# Patient Record
Sex: Female | Born: 1965 | Race: White | Hispanic: No | Smoking: Never smoker
Health system: Southern US, Community
[De-identification: ages and names within clinical notes are randomized; demographics above are authoritative.]

## PROBLEM LIST (undated history)

## (undated) DIAGNOSIS — R569 Unspecified convulsions: Secondary | ICD-10-CM

## (undated) DIAGNOSIS — F32A Depression, unspecified: Secondary | ICD-10-CM

## (undated) DIAGNOSIS — F329 Major depressive disorder, single episode, unspecified: Secondary | ICD-10-CM

## (undated) HISTORY — DX: Unspecified convulsions: R56.9

## (undated) HISTORY — PX: BREAST SURGERY: SHX581

---

## 2015-01-11 DIAGNOSIS — G43109 Migraine with aura, not intractable, without status migrainosus: Secondary | ICD-10-CM | POA: Insufficient documentation

## 2016-02-21 DIAGNOSIS — N6092 Unspecified benign mammary dysplasia of left breast: Secondary | ICD-10-CM | POA: Insufficient documentation

## 2016-09-18 DIAGNOSIS — G43019 Migraine without aura, intractable, without status migrainosus: Secondary | ICD-10-CM | POA: Insufficient documentation

## 2016-11-06 ENCOUNTER — Encounter: Payer: Self-pay | Admitting: Emergency Medicine

## 2016-11-06 ENCOUNTER — Emergency Department
Admission: EM | Admit: 2016-11-06 | Discharge: 2016-11-06 | Disposition: A | Discharge status: Home / Self Care | Payer: No Typology Code available for payment source | Attending: Student in an Organized Health Care Education/Training Program | Admitting: Student in an Organized Health Care Education/Training Program

## 2016-11-06 ENCOUNTER — Emergency Department: Payer: No Typology Code available for payment source

## 2016-11-06 DIAGNOSIS — S161XXA Strain of muscle, fascia and tendon at neck level, initial encounter: Secondary | ICD-10-CM | POA: Diagnosis not present

## 2016-11-06 DIAGNOSIS — Y92410 Unspecified street and highway as the place of occurrence of the external cause: Secondary | ICD-10-CM | POA: Diagnosis not present

## 2016-11-06 DIAGNOSIS — Z91040 Latex allergy status: Secondary | ICD-10-CM | POA: Diagnosis not present

## 2016-11-06 DIAGNOSIS — S20211A Contusion of right front wall of thorax, initial encounter: Secondary | ICD-10-CM | POA: Insufficient documentation

## 2016-11-06 DIAGNOSIS — Y9389 Activity, other specified: Secondary | ICD-10-CM | POA: Insufficient documentation

## 2016-11-06 DIAGNOSIS — Y999 Unspecified external cause status: Secondary | ICD-10-CM | POA: Insufficient documentation

## 2016-11-06 DIAGNOSIS — M7918 Myalgia, other site: Secondary | ICD-10-CM

## 2016-11-06 DIAGNOSIS — S199XXA Unspecified injury of neck, initial encounter: Secondary | ICD-10-CM | POA: Diagnosis present

## 2016-11-06 HISTORY — DX: Depression, unspecified: F32.A

## 2016-11-06 HISTORY — DX: Major depressive disorder, single episode, unspecified: F32.9
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
## 2016-11-06 MED ORDER — TRAMADOL HCL 50 MG PO TABS: 50.0000 mg | ORAL_TABLET | Freq: Four times a day (QID) | ORAL | 0 refills | Status: AC | PRN

## 2016-11-06 MED ORDER — TRAMADOL HCL 50 MG PO TABS
50.0000 mg | ORAL_TABLET | Freq: Once | ORAL | Status: AC
  Administered 2016-11-06: 50 mg via ORAL
  Filled 2016-11-06: qty 1

## 2016-11-06 MED ORDER — IBUPROFEN 600 MG PO TABS: 600.0000 mg | ORAL_TABLET | Freq: Three times a day (TID) | ORAL | 0 refills | Status: DC | PRN

## 2016-11-06 MED ORDER — METHOCARBAMOL 750 MG PO TABS: 750.0000 mg | ORAL_TABLET | Freq: Four times a day (QID) | ORAL | 0 refills | Status: DC

## 2016-11-06 MED ORDER — CYCLOBENZAPRINE HCL 10 MG PO TABS
10.0000 mg | ORAL_TABLET | Freq: Once | ORAL | Status: AC
  Administered 2016-11-06: 10 mg via ORAL
  Filled 2016-11-06: qty 1
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
## 2016-11-17 DIAGNOSIS — G709 Myoneural disorder, unspecified: Secondary | ICD-10-CM | POA: Insufficient documentation

## 2017-09-01 ENCOUNTER — Emergency Department
Admission: EM | Admit: 2017-09-01 | Discharge: 2017-09-01 | Disposition: A | Discharge status: Home / Self Care | Payer: BLUE CROSS/BLUE SHIELD | Attending: Emergency Medicine | Admitting: Emergency Medicine

## 2017-09-01 ENCOUNTER — Encounter: Payer: Self-pay | Admitting: Emergency Medicine

## 2017-09-01 DIAGNOSIS — Z79899 Other long term (current) drug therapy: Secondary | ICD-10-CM | POA: Insufficient documentation

## 2017-09-01 DIAGNOSIS — Z91010 Allergy to peanuts: Secondary | ICD-10-CM | POA: Diagnosis not present

## 2017-09-01 DIAGNOSIS — N6320 Unspecified lump in the left breast, unspecified quadrant: Secondary | ICD-10-CM | POA: Insufficient documentation

## 2017-09-01 DIAGNOSIS — Z91040 Latex allergy status: Secondary | ICD-10-CM | POA: Insufficient documentation

## 2017-09-01 DIAGNOSIS — N6310 Unspecified lump in the right breast, unspecified quadrant: Secondary | ICD-10-CM

## 2017-09-01 LAB — CBC WITH DIFFERENTIAL/PLATELET
Basophils Absolute: 0 10*3/uL (ref 0–0.1)
Basophils Relative: 0 %
Eosinophils Absolute: 0 10*3/uL (ref 0–0.7)
Eosinophils Relative: 0 %
HEMATOCRIT: 32.6 % — AB (ref 35.0–47.0)
HEMOGLOBIN: 10.9 g/dL — AB (ref 12.0–16.0)
LYMPHS ABS: 1.9 10*3/uL (ref 1.0–3.6)
LYMPHS PCT: 19 %
MCH: 27.7 pg (ref 26.0–34.0)
MCHC: 33.5 g/dL (ref 32.0–36.0)
MCV: 82.7 fL (ref 80.0–100.0)
Monocytes Absolute: 0.7 10*3/uL (ref 0.2–0.9)
Monocytes Relative: 7 %
NEUTROS ABS: 7.2 10*3/uL — AB (ref 1.4–6.5)
Neutrophils Relative %: 74 %
Platelets: 347 10*3/uL (ref 150–440)
RBC: 3.94 MIL/uL (ref 3.80–5.20)
RDW: 13.5 % (ref 11.5–14.5)
WBC: 9.9 10*3/uL (ref 3.6–11.0)

## 2017-09-01 LAB — BASIC METABOLIC PANEL
ANION GAP: 8 (ref 5–15)
BUN: 8 mg/dL (ref 6–20)
CHLORIDE: 108 mmol/L (ref 101–111)
CO2: 21 mmol/L — ABNORMAL LOW (ref 22–32)
Calcium: 8.6 mg/dL — ABNORMAL LOW (ref 8.9–10.3)
Creatinine, Ser: 0.75 mg/dL (ref 0.44–1.00)
GFR calc Af Amer: >60 mL/min (ref >60)
GLUCOSE: 106 mg/dL — AB (ref 65–99)
POTASSIUM: 3.9 mmol/L (ref 3.5–5.1)
SODIUM: 137 mmol/L (ref 135–145)

## 2017-09-01 MED ORDER — MORPHINE SULFATE (PF) 2 MG/ML IV SOLN
INTRAVENOUS | Status: AC
  Filled 2017-09-01: qty 1

## 2017-09-01 MED ORDER — SODIUM CHLORIDE 0.9 % IV BOLUS (SEPSIS)
1000.0000 mL | Freq: Once | INTRAVENOUS | Status: AC
  Administered 2017-09-01: 1000 mL via INTRAVENOUS

## 2017-09-01 MED ORDER — ONDANSETRON HCL 4 MG/2ML IJ SOLN
4.0000 mg | Freq: Once | INTRAMUSCULAR | Status: AC
  Administered 2017-09-01: 4 mg via INTRAVENOUS
  Filled 2017-09-01: qty 2

## 2017-09-01 MED ORDER — MORPHINE SULFATE (PF) 2 MG/ML IV SOLN
2.0000 mg | Freq: Once | INTRAVENOUS | Status: AC
  Administered 2017-09-01: 2 mg via INTRAVENOUS

## 2017-09-01 MED ORDER — DOXYCYCLINE HYCLATE 100 MG PO CAPS
100.0000 mg | ORAL_CAPSULE | Freq: Two times a day (BID) | ORAL | 0 refills | Status: DC
Start: 2017-09-01 — End: 2020-09-13

## 2017-09-01 MED ORDER — ONDANSETRON HCL 4 MG/2ML IJ SOLN
2.0000 mg | Freq: Once | INTRAMUSCULAR | Status: AC
  Administered 2017-09-01: 2 mg via INTRAVENOUS
  Filled 2017-09-01: qty 2

## 2017-09-01 MED ORDER — HYDROCODONE-ACETAMINOPHEN 5-325 MG PO TABS: 1.0000 | ORAL_TABLET | ORAL | 0 refills | Status: DC | PRN

## 2017-09-01 MED ORDER — MORPHINE SULFATE (PF) 4 MG/ML IV SOLN
4.0000 mg | Freq: Once | INTRAVENOUS | Status: AC
  Administered 2017-09-01: 4 mg via INTRAVENOUS
  Filled 2017-09-01: qty 1

## 2017-09-01 MED ORDER — DOXYCYCLINE HYCLATE 100 MG PO TABS
100.0000 mg | ORAL_TABLET | Freq: Two times a day (BID) | ORAL | Status: DC
  Administered 2017-09-01: 100 mg via ORAL
  Filled 2017-09-01: qty 1

## 2017-09-01 NOTE — ED Provider Notes (Signed)
[facility] Emergency Department Provider Note ____________________________________________   I have reviewed the triage vital signs and the triage nursing note.  HISTORY  Chief Complaint Abscess   Historian Patient  HPI Chelsea Stanley is a 51 y.o. female presents for evaluation of painful breast mass with drainage.  She has had a complicated history with multiple breast biopsies over this past two years.  Today states right breast lump opened and is draining clear liquid x a few days.  Pain has increased to the left lateral breast where there is a tender breast mass without redness or drainage.  Review of prior chart history:  She had a leftsuperior lateral breast lumpectomy in 02/2016. Gets treatment with breast surgeon Dr. Marcene Duos at [facility]. In 04/2016 had I&D left breast abscess in OR with wound vac. 03/2017 complaint of 2 right breast lumps and 2 left breast lumps with nipple discharge 05/2017 results reported to patient of negative pathology of right sterotactic biopsy.  Was complaining of painful breast lumps, no nipple discharge.   Patient states she called her doctor's office a day or 2 ago and was told to come to the ER for drainage.  Pain is severe.     Past Medical History:  Diagnosis Date  . Depression   Psychiatric hospital admission in past year  There are no active problems to display for this patient.   Past Surgical History:  Procedure Laterality Date  . BREAST SURGERY      Prior to Admission medications   Medication Sig Start Date End Date Taking? Authorizing Provider  hydrOXYzine (ATARAX/VISTARIL) 50 MG tablet Take 50 mg by mouth daily as needed. 09/22/16  Yes [provider]  mirtazapine (REMERON) 15 MG tablet Take 15 mg by mouth at bedtime.   Yes [provider]  promethazine (PHENERGAN) 12.5 MG tablet Take 12.5 mg by mouth every 6 (six) hours as needed for nausea or vomiting.   Yes [provider]   traZODone (DESYREL) 100 MG tablet Take 100 mg by mouth daily. 09/22/16  Yes [provider]  Butalbital-APAP-Caffeine 50-325-40 MG capsule Take 1 capsule by mouth every 4 (four) hours as needed.    [provider]  clonazePAM (KLONOPIN) 0.5 MG tablet Take 3 tablets by mouth at bedtime.    [provider]  ibuprofen (ADVIL,MOTRIN) 600 MG tablet Take 1 tablet (600 mg total) by mouth every 8 (eight) hours as needed. 11/06/16   Joni Reining, PA-C  methocarbamol (ROBAXIN-750) 750 MG tablet Take 1 tablet (750 mg total) by mouth 4 (four) times daily. Patient not taking: Reported on 09/01/2017 11/06/16   Joni Reining, PA-C  sertraline (ZOLOFT) 50 MG tablet Take 50 mg by mouth at bedtime.    [provider]  traMADol (ULTRAM) 50 MG tablet Take 1 tablet (50 mg total) by mouth every 6 (six) hours as needed. 11/06/16 11/06/17  Joni Reining, PA-C    Allergies  Allergen Reactions  . Penicillins Anaphylaxis    Has patient had a PCN reaction causing immediate rash, facial/tongue/throat swelling, SOB or lightheadedness with hypotension: Yes Has patient had a PCN reaction causing severe rash involving mucus membranes or skin necrosis: Yes Has patient had a PCN reaction that required hospitalization: Yes Has patient had a PCN reaction occurring within the last 10 years: No If all of the above answers are "NO", then may proceed with Cephalosporin use.   Rolland Porter (Diagnostic) Anaphylaxis  . Peanut-Containing Drug Products Swelling  . Latex Rash  .  Sulfa Antibiotics Rash    No family history on file.  Social History Social History  Substance Use Topics  . Smoking status: Never Smoker  . Smokeless tobacco: Never Used  . Alcohol use No    Review of Systems  Constitutional: Negative for fever. Eyes: Negative for visual changes. ENT: Negative for sore throat. Cardiovascular: Negative for chest pain. Respiratory: Negative for shortness of  breath. Gastrointestinal: Negative for abdominal pain, vomiting and diarrhea. Genitourinary: Negative for dysuria. Musculoskeletal: Negative for back pain. Skin: Negative for rash. Neurological: Negative for headache.  ____________________________________________   PHYSICAL EXAM:  VITAL SIGNS: ED Triage Vitals [09/01/17 1036]  Enc Vitals Group     BP (!) 109/56     Pulse Rate 77     Resp 18     Temp 98.1 F (36.7 C)     Temp Source Oral     SpO2 97 %     Weight 100 lb (45.4 kg)     Height  (1.651 m)     Head Circumference      Peak Flow      Pain Score      Pain Loc      Pain Edu?      Excl. in GC?      Constitutional: Alert and oriented. Well appearing and in no distress. HEENT   Head: Normocephalic and atraumatic.      Eyes: Conjunctivae are normal. Pupils equal and round.       Ears:         Nose: No congestion/rhinnorhea.   Mouth/Throat: Mucous membranes are moist.   Neck: No stridor. Cardiovascular/Chest: Normal rate, regular rhythm.  No murmurs, rubs, or gallops. Breast:  Right lateral breast with tender area laterally and drainage site, serosanguinous appearance.  No nipple discharge.  Left breast with wound incision inferior breast, mildly tender.  exquisite tenderness to the lateral aspect of the left breast which is swollen and firm without redness overlying skin erythema. Respiratory: Normal respiratory effort without tachypnea nor retractions. Breath sounds are clear and equal bilaterally. No wheezes/rales/rhonchi. Gastrointestinal: Soft. No distention, no guarding, no rebound. Nontender.    Genitourinary/rectal:Deferred Musculoskeletal: Nontender with normal range of motion in all extremities. No joint effusions.  No lower extremity tenderness.  No edema. Neurologic:  Normal speech and language. No gross or focal neurologic deficits are appreciated. Skin:  Skin is warm, dry and intact. No rash noted. Psychiatric: Mood and affect are normal.  Speech and behavior are normal. Patient exhibits appropriate insight and judgment.   ____________________________________________  LABS (pertinent positives/negatives) I, Governor Rooks, MD the attending physician have reviewed the labs noted below.  Labs Reviewed  BASIC METABOLIC PANEL  CBC WITH DIFFERENTIAL/PLATELET    ____________________________________________    EKG I, Governor Rooks, MD, the attending physician have personally viewed and interpreted all ECGs.  None ____________________________________________  RADIOLOGY All Xrays were viewed by me.  Imaging interpreted by Radiologist, and I, Governor Rooks, MD the attending physician have reviewed the radiologist interpretation noted below.  None __________________________________________  PROCEDURES  Procedure(s) performed: None  Critical Care performed: None  ____________________________________________  No current facility-administered medications on file prior to encounter.    Current Outpatient Prescriptions on File Prior to Encounter  Medication Sig Dispense Refill  . ibuprofen (ADVIL,MOTRIN) 600 MG tablet Take 1 tablet (600 mg total) by mouth every 8 (eight) hours as needed. 15 tablet 0  . methocarbamol (ROBAXIN-750) 750 MG tablet Take 1 tablet (750 mg total) by mouth  4 (four) times daily. (Patient not taking: Reported on 09/01/2017) 20 tablet 0  . traMADol (ULTRAM) 50 MG tablet Take 1 tablet (50 mg total) by mouth every 6 (six) hours as needed. 20 tablet 0    ____________________________________________  ED COURSE / ASSESSMENT AND PLAN  Pertinent labs & imaging results that were available during my care of the patient were reviewed by me and considered in my medical decision making (see chart for details).   patient is nearly tearful in pain. The right side of the lateral breast is draining and does not appear frankly purulent, more serosanguineous, there is no redness there, but there does appear to be  some mild lump the appearance which is especially tender. The left breast is also lumpy to visualization and exquisitely tender laterally without redness or overlying erythema or pointing.   laboratory studies are overall reassuring. No elevated white blood count, there is a slight left shift.  Patient is symptomatically much improved after dose of morphine.  I spoke with the patient's own breast surgeon's nurse who is going to make sure the patient has an office visit within 2 days, on Thursday. I am going to go ahead and cover the patient with doxycycline for possible infectious etiology, although there is no redness or warmth or fever, and I'm not her percent convinced that this is actually infectious in etiology. In any case I'm going to give her pain control, she has tolerated hydrocodone before. Was unable to access the [place] drug database due to login issues.  DIFFERENTIAL DIAGNOSIS: including but not limited to abscess, cancer, neuropathic or chronic pain/nerve damage from multiple prior biopsies and surgeries, etc.  CONSULTATIONS:  General/breast surgery Dr. Brain Hilts nurse, [facility], patient will be called for appointment in the office on Thursday.   Patient / Family / Caregiver informed of clinical course, medical decision-making process, and agree with plan.   I discussed return precautions, follow-up instructions, and discharge instructions with patient and/or family.  Discharge Instructions : Dineen Kid being treated with antibiotic doxycycline for possibility of infection as well as pain medication for symptomatic relief until you can be seen in the office by your breast surgeon.  You should be called by the office, but if you have not heard tomorrow, please call.  Return to the emergency room immediately for any worsening or uncontrolled pain, skin redness or rash, fever, or any other symptoms concerning to you.  ___________________________________________   FINAL CLINICAL  IMPRESSION(S) / ED DIAGNOSES   Final diagnoses:  Breast mass, left  Breast mass, right              Note: This dictation was prepared with Dragon dictation. Any transcriptional errors that result from this process are unintentional    Governor Rooks, MD 09/01/17 1441

## 2017-09-01 NOTE — Discharge Instructions (Signed)
you're being treated with antibiotic doxycycline for possibility of infection as well as pain medication for symptomatic relief until you can be seen in the office by your breast surgeon.  You should be called by the office, but if you have not heard tomorrow, please call.  Return to the emergency room immediately for any worsening or uncontrolled pain, skin redness or rash, fever, or any other symptoms concerning to you.

## 2017-09-01 NOTE — ED Notes (Signed)
ED Provider at bedside. 

## 2017-09-01 NOTE — ED Triage Notes (Addendum)
Pt with abscess to right breast with drainage and hard abcess to left breast. Pt with biopsies to both breast about three mths ago.

## 2018-01-07 DIAGNOSIS — F445 Conversion disorder with seizures or convulsions: Secondary | ICD-10-CM

## 2018-01-07 DIAGNOSIS — G40909 Epilepsy, unspecified, not intractable, without status epilepticus: Secondary | ICD-10-CM

## 2018-01-11 DIAGNOSIS — I5181 Takotsubo syndrome: Secondary | ICD-10-CM | POA: Insufficient documentation

## 2018-06-18 ENCOUNTER — Ambulatory Visit
Admission: RE | Admit: 2018-06-18 | Discharge: 2018-06-18 | Disposition: A | Discharge status: Home / Self Care | Payer: Disability Insurance | Source: Ambulatory Visit | Attending: Thoracic Surgery | Admitting: Thoracic Surgery

## 2018-06-18 ENCOUNTER — Other Ambulatory Visit: Payer: Self-pay | Admitting: Thoracic Surgery

## 2018-06-18 DIAGNOSIS — M542 Cervicalgia: Secondary | ICD-10-CM

## 2018-06-18 DIAGNOSIS — G8929 Other chronic pain: Secondary | ICD-10-CM | POA: Diagnosis present

## 2018-06-18 DIAGNOSIS — I509 Heart failure, unspecified: Secondary | ICD-10-CM

## 2018-07-16 ENCOUNTER — Other Ambulatory Visit: Payer: Self-pay

## 2018-07-16 ENCOUNTER — Emergency Department: Payer: BLUE CROSS/BLUE SHIELD

## 2018-07-16 ENCOUNTER — Encounter: Payer: Self-pay | Admitting: Emergency Medicine

## 2018-07-16 ENCOUNTER — Emergency Department
Admission: EM | Admit: 2018-07-16 | Discharge: 2018-07-16 | Disposition: A | Discharge status: Home / Self Care | Payer: BLUE CROSS/BLUE SHIELD | Attending: Emergency Medicine | Admitting: Emergency Medicine

## 2018-07-16 DIAGNOSIS — R569 Unspecified convulsions: Secondary | ICD-10-CM | POA: Insufficient documentation

## 2018-07-16 DIAGNOSIS — Z91040 Latex allergy status: Secondary | ICD-10-CM | POA: Insufficient documentation

## 2018-07-16 DIAGNOSIS — Z91010 Allergy to peanuts: Secondary | ICD-10-CM | POA: Insufficient documentation

## 2018-07-16 LAB — CBC WITH DIFFERENTIAL/PLATELET
Basophils Absolute: 0 10*3/uL (ref 0–0.1)
Basophils Relative: 1 %
EOS ABS: 0 10*3/uL (ref 0–0.7)
Eosinophils Relative: 1 %
HCT: 35.2 % (ref 35.0–47.0)
HEMOGLOBIN: 11.8 g/dL — AB (ref 12.0–16.0)
LYMPHS ABS: 1.7 10*3/uL (ref 1.0–3.6)
LYMPHS PCT: 30 %
MCH: 27.6 pg (ref 26.0–34.0)
MCHC: 33.6 g/dL (ref 32.0–36.0)
MCV: 82.1 fL (ref 80.0–100.0)
MONOS PCT: 9 %
Monocytes Absolute: 0.5 10*3/uL (ref 0.2–0.9)
NEUTROS PCT: 59 %
Neutro Abs: 3.5 10*3/uL (ref 1.4–6.5)
Platelets: 246 10*3/uL (ref 150–440)
RBC: 4.29 MIL/uL (ref 3.80–5.20)
RDW: 16.5 % — ABNORMAL HIGH (ref 11.5–14.5)
WBC: 5.9 10*3/uL (ref 3.6–11.0)

## 2018-07-16 LAB — URINALYSIS, COMPLETE (UACMP) WITH MICROSCOPIC
Bilirubin Urine: NEGATIVE
GLUCOSE, UA: NEGATIVE mg/dL
HGB URINE DIPSTICK: NEGATIVE
Ketones, ur: NEGATIVE mg/dL
Leukocytes, UA: NEGATIVE
Nitrite: NEGATIVE
PROTEIN: NEGATIVE mg/dL
SPECIFIC GRAVITY, URINE: 1.004 — AB (ref 1.005–1.030)
pH: 5 (ref 5.0–8.0)

## 2018-07-16 LAB — COMPREHENSIVE METABOLIC PANEL
ALK PHOS: 57 U/L (ref 38–126)
ALT: 11 U/L (ref 0–44)
AST: 30 U/L (ref 15–41)
Albumin: 3.7 g/dL (ref 3.5–5.0)
Anion gap: 4 — ABNORMAL LOW (ref 5–15)
BILIRUBIN TOTAL: 0.3 mg/dL (ref 0.3–1.2)
BUN: 11 mg/dL (ref 6–20)
CALCIUM: 8.5 mg/dL — AB (ref 8.9–10.3)
CO2: 24 mmol/L (ref 22–32)
Chloride: 110 mmol/L (ref 98–111)
Creatinine, Ser: 0.63 mg/dL (ref 0.44–1.00)
Glucose, Bld: 90 mg/dL (ref 70–99)
Potassium: 4.7 mmol/L (ref 3.5–5.1)
SODIUM: 138 mmol/L (ref 135–145)
TOTAL PROTEIN: 6.3 g/dL — AB (ref 6.5–8.1)

## 2018-07-16 LAB — URINE DRUG SCREEN, QUALITATIVE (ARMC ONLY)
AMPHETAMINES, UR SCREEN: NOT DETECTED
Barbiturates, Ur Screen: NOT DETECTED
Cannabinoid 50 Ng, Ur ~~LOC~~: NOT DETECTED
Cocaine Metabolite,Ur ~~LOC~~: NOT DETECTED
MDMA (ECSTASY) UR SCREEN: NOT DETECTED
METHADONE SCREEN, URINE: NOT DETECTED
OPIATE, UR SCREEN: NOT DETECTED
PHENCYCLIDINE (PCP) UR S: NOT DETECTED
Tricyclic, Ur Screen: NOT DETECTED

## 2018-07-16 LAB — TROPONIN I: Troponin I: <0.03 ng/mL (ref <0.03)

## 2018-07-16 LAB — ETHANOL: Alcohol, Ethyl (B): <10 mg/dL (ref <10)

## 2018-07-16 MED ORDER — MIDAZOLAM HCL 5 MG/5ML IJ SOLN
INTRAMUSCULAR | Status: AC
  Administered 2018-07-16: 1 mg via INTRAVENOUS
  Filled 2018-07-16: qty 5

## 2018-07-16 MED ORDER — ACETAMINOPHEN 500 MG PO TABS
1000.0000 mg | ORAL_TABLET | Freq: Once | ORAL | Status: AC
  Administered 2018-07-16: 1000 mg via ORAL
  Filled 2018-07-16: qty 2

## 2018-07-16 MED ORDER — OXYCODONE-ACETAMINOPHEN 5-325 MG PO TABS
2.0000 | ORAL_TABLET | Freq: Once | ORAL | Status: AC
  Administered 2018-07-16: 2 via ORAL
  Filled 2018-07-16: qty 2

## 2018-07-16 MED ORDER — MIDAZOLAM HCL 5 MG/5ML IJ SOLN
1.0000 mg | Freq: Once | INTRAMUSCULAR | Status: AC
  Administered 2018-07-16: 1 mg via INTRAVENOUS

## 2018-07-16 MED ORDER — LORAZEPAM 2 MG/ML IJ SOLN
1.0000 mg | Freq: Once | INTRAMUSCULAR | Status: AC
  Administered 2018-07-16: 1 mg via INTRAVENOUS
  Filled 2018-07-16: qty 1

## 2018-07-16 NOTE — ED Provider Notes (Signed)
[facility] Emergency Department Provider Note       Time seen: ----------------------------------------- 9:14 AM on 07/16/2018 -----------------------------------------   I have reviewed the triage vital signs and the nursing notes.  HISTORY   Chief Complaint No chief complaint on file.    HPI Chelsea Stanley is a 52 y.o. female with a history of depression who presents to the ED for seizure-like activity.  Patient was walking her dog when her service dog was attacked by other dogs.  She subsequently fell and had seizure-like activity.  EMS arrived and gave her IM Versed.  She did have resolution in her seizure-like activity once the Versed took effect.  She is complaining of some chest pain, otherwise denies complaints.  Past Medical History:  Diagnosis Date  . Depression     There are no active problems to display for this patient.   Past Surgical History:  Procedure Laterality Date  . BREAST SURGERY      Allergies Penicillins; Strawberry (diagnostic); Peanut-containing drug products; Latex; and Sulfa antibiotics  Social History Social History   Tobacco Use  . Smoking status: Never Smoker  . Smokeless tobacco: Never Used  Substance Use Topics  . Alcohol use: No  . Drug use: Not on file   Review of Systems Constitutional: Negative for fever. Cardiovascular: Positive for chest pain Respiratory: Negative for shortness of breath. Gastrointestinal: Negative for abdominal pain, vomiting and diarrhea. Musculoskeletal: Negative for back pain. Skin: Negative for rash. Neurological: Positive for weakness  All systems negative/normal/unremarkable except as stated in the HPI  ____________________________________________   PHYSICAL EXAM:  VITAL SIGNS: ED Triage Vitals  Enc Vitals Group     BP      Pulse      Resp      Temp      Temp src      SpO2      Weight      Height      Head Circumference      Peak Flow      Pain Score       Pain Loc      Pain Edu?      Excl. in GC?    Constitutional: Alert and oriented.  Mild distress Eyes: Conjunctivae are normal. Normal extraocular movements. ENT   Head: Normocephalic and atraumatic.   Nose: No congestion/rhinnorhea.   Mouth/Throat: Mucous membranes are moist.   Neck: No stridor. Cardiovascular: Normal rate, regular rhythm. No murmurs, rubs, or gallops. Respiratory: Normal respiratory effort without tachypnea nor retractions. Breath sounds are clear and equal bilaterally. No wheezes/rales/rhonchi. Gastrointestinal: Soft and nontender. Normal bowel sounds Musculoskeletal: Nontender with normal range of motion in extremities. No lower extremity tenderness nor edema. Neurologic:  Normal speech and language. No gross focal neurologic deficits are appreciated.  Tremor is noted Skin:  Skin is warm, dry and intact. No rash noted. Psychiatric: Anxious mood and affect ____________________________________________  EKG: Interpreted by me.  Sinus rhythm the rate of 67 bpm, left axis deviation, borderline long QT, normal axis.  ____________________________________________  ED COURSE:  As part of my medical decision making, I reviewed the following data within the electronic MEDICAL RECORD NUMBER History obtained from family if available, nursing notes, old chart and ekg, as well as notes from prior ED visits. Patient presented for likely seizure, we will assess with labs and imaging as indicated at this time.   Procedures ____________________________________________   LABS (pertinent positives/negatives)  Labs Reviewed  CBC WITH DIFFERENTIAL/PLATELET - Abnormal; Notable  for the following components:      Result Value   Hemoglobin 11.8 (*)    RDW 16.5 (*)    All other components within normal limits  COMPREHENSIVE METABOLIC PANEL - Abnormal; Notable for the following components:   Calcium 8.5 (*)    Total Protein 6.3 (*)    Anion gap 4 (*)    All other  components within normal limits  URINALYSIS, COMPLETE (UACMP) WITH MICROSCOPIC - Abnormal; Notable for the following components:   Color, Urine STRAW (*)    APPearance CLEAR (*)    Specific Gravity, Urine 1.004 (*)    Bacteria, UA RARE (*)    All other components within normal limits  URINE DRUG SCREEN, QUALITATIVE ([facility] ONLY) - Abnormal; Notable for the following components:   Benzodiazepine, Ur Scrn TEST NOT PERFORMED, REAGENT NOT AVAILABLE (*)    All other components within normal limits  TROPONIN I  ETHANOL  TROPONIN I  CBG MONITORING, ED    RADIOLOGY Images were viewed by me  CT head IMPRESSION: Normal head CT ____________________________________________  DIFFERENTIAL DIAGNOSIS   Pseudoseizure, seizure, dehydration, electrolyte abnormality, occult infection, subdural  FINAL ASSESSMENT AND PLAN  Seizure-like activity   Plan: The patient had presented for seizure-like activity. Patient's labs did not reveal any acute process. Patient's imaging was also negative.  Patient has not had any further events although she had a seizure-like event at CT.  She currently is back to her baseline and appears cleared for outpatient follow-up.   Ulice DashJohnathan E Brixton Schnapp, MD   Note: This note was generated in part or whole with voice recognition software. Voice recognition is usually quite accurate but there are transcription errors that can and very often do occur. I apologize for any typographical errors that were not detected and corrected.     Emily FilbertWilliams, Halana Deisher E, MD 07/16/18 1130

## 2018-07-16 NOTE — ED Triage Notes (Signed)
Pt out walking with service dog and fell to ground during seizure per ems. Per ems they witnessed 3 seizures in route. 2+2 mg iv versed given and 4 mg iv  zofran given by ems. Pt confirms hx of seizures and states she has been compliant with meds.

## 2020-09-13 ENCOUNTER — Other Ambulatory Visit: Payer: Self-pay

## 2020-09-13 ENCOUNTER — Emergency Department
Admission: EM | Admit: 2020-09-13 | Discharge: 2020-09-13 | Disposition: A | Discharge status: Home / Self Care | Payer: BC Managed Care – PPO | Attending: Emergency Medicine | Admitting: Emergency Medicine

## 2020-09-13 DIAGNOSIS — Z91040 Latex allergy status: Secondary | ICD-10-CM | POA: Diagnosis not present

## 2020-09-13 DIAGNOSIS — L03114 Cellulitis of left upper limb: Secondary | ICD-10-CM

## 2020-09-13 DIAGNOSIS — Z4800 Encounter for change or removal of nonsurgical wound dressing: Secondary | ICD-10-CM | POA: Insufficient documentation

## 2020-09-13 DIAGNOSIS — Z91010 Allergy to peanuts: Secondary | ICD-10-CM | POA: Insufficient documentation

## 2020-09-13 LAB — CBC WITH DIFFERENTIAL/PLATELET
Abs Immature Granulocytes: 0.02 10*3/uL (ref 0.00–0.07)
Basophils Absolute: 0.1 10*3/uL (ref 0.0–0.1)
Basophils Relative: 1 %
Eosinophils Absolute: 0.1 10*3/uL (ref 0.0–0.5)
Eosinophils Relative: 1 %
HCT: 39.9 % (ref 36.0–46.0)
Hemoglobin: 12.6 g/dL (ref 12.0–15.0)
Immature Granulocytes: 0 %
Lymphocytes Relative: 44 %
Lymphs Abs: 2.7 10*3/uL (ref 0.7–4.0)
MCH: 26.3 pg (ref 26.0–34.0)
MCHC: 31.6 g/dL (ref 30.0–36.0)
MCV: 83.3 fL (ref 80.0–100.0)
Monocytes Absolute: 0.5 10*3/uL (ref 0.1–1.0)
Monocytes Relative: 8 %
Neutro Abs: 2.8 10*3/uL (ref 1.7–7.7)
Neutrophils Relative %: 46 %
Platelets: 288 10*3/uL (ref 150–400)
RBC: 4.79 MIL/uL (ref 3.87–5.11)
RDW: 14.8 % (ref 11.5–15.5)
WBC: 6.1 10*3/uL (ref 4.0–10.5)
nRBC: 0 % (ref 0.0–0.2)

## 2020-09-13 LAB — COMPREHENSIVE METABOLIC PANEL
ALT: 16 U/L (ref 0–44)
AST: 21 U/L (ref 15–41)
Albumin: 3.9 g/dL (ref 3.5–5.0)
Alkaline Phosphatase: 84 U/L (ref 38–126)
Anion gap: 7 (ref 5–15)
BUN: 9 mg/dL (ref 6–20)
CO2: 25 mmol/L (ref 22–32)
Calcium: 8.9 mg/dL (ref 8.9–10.3)
Chloride: 107 mmol/L (ref 98–111)
Creatinine, Ser: 0.67 mg/dL (ref 0.44–1.00)
GFR, Estimated: >60 mL/min (ref >60)
Glucose, Bld: 85 mg/dL (ref 70–99)
Potassium: 3.6 mmol/L (ref 3.5–5.1)
Sodium: 139 mmol/L (ref 135–145)
Total Bilirubin: 0.6 mg/dL (ref 0.3–1.2)
Total Protein: 7 g/dL (ref 6.5–8.1)

## 2020-09-13 LAB — C-REACTIVE PROTEIN: CRP: 0.6 mg/dL (ref <1.0)

## 2020-09-13 MED ORDER — LIDOCAINE-PRILOCAINE 2.5-2.5 % EX CREA
TOPICAL_CREAM | CUTANEOUS | Status: AC
  Filled 2020-09-13: qty 5

## 2020-09-13 MED ORDER — LIDOCAINE HCL URETHRAL/MUCOSAL 2 % EX GEL
1.0000 "application " | Freq: Once | CUTANEOUS | Status: AC
  Administered 2020-09-13: 1 via TOPICAL
  Filled 2020-09-13: qty 5

## 2020-09-13 MED ORDER — LIDOCAINE HCL URETHRAL/MUCOSAL 2 % EX GEL
1.0000 "application " | Freq: Once | CUTANEOUS | Status: DC
  Filled 2020-09-13: qty 5

## 2020-09-13 MED ORDER — HYDROCODONE-ACETAMINOPHEN 5-325 MG PO TABS: 1.0000 | ORAL_TABLET | Freq: Three times a day (TID) | ORAL | 0 refills | Status: AC | PRN

## 2020-09-13 MED ORDER — LIDOCAINE 2 % EX GEL: 1.0000 "application " | Freq: Every day | CUTANEOUS | 0 refills | Status: AC | PRN

## 2020-09-13 MED ORDER — DOXYCYCLINE HYCLATE 100 MG PO TABS: 100.0000 mg | ORAL_TABLET | Freq: Two times a day (BID) | ORAL | 0 refills | Status: DC

## 2020-09-13 NOTE — ED Notes (Signed)
Rainbow with gray on ice was sent to lab. 

## 2020-09-13 NOTE — ED Provider Notes (Signed)
[facility] Emergency Department Provider Note ____________________________________________  Time seen: 1256  I have reviewed the triage vital signs and the nursing notes.  HISTORY  Chief Complaint  Wound Check   HPI Chelsea Stanley is a 54 y.o. female presents to the ED accompanied by her husband, for evaluation of a chronic left upper extremity wound.  Patient reports the wound is been present for better than a year for she is been followed by her nurse practitioner at [facility]  She apparently had a visit yesterday where they attempted to do a wound culture, but the patient left after she reports that the provider attempted to snatch the bandage off to collect the sample.  She presents here today,  for evaluation and management.  Patient gives a remote history of multiple large subcutaneous skin abscesses, requiring hospitalization and IV antibiotics.  She has not been referred to wound care or infectious diseases in the past.  Denies any interim fever, chills, or sweats.  She reports the typical purulent drainage from the volar forearm wound.  She manages the wounds with every other day dressing changes at home.  She also reports some skin sensitivity and breakdown secondary to tapes and other adhesives.  Past Medical History:  Diagnosis Date  . Depression     There are no problems to display for this patient.   Past Surgical History:  Procedure Laterality Date  . BREAST SURGERY      Prior to Admission medications   Medication Sig Start Date End Date Taking? Authorizing Provider  Butalbital-APAP-Caffeine 50-325-40 MG capsule Take 1 capsule by mouth every 4 (four) hours as needed.    [provider]  clonazePAM (KLONOPIN) 0.5 MG tablet Take 3 tablets by mouth at bedtime.    [provider]  doxycycline (VIBRA-TABS) 100 MG tablet Take 1 tablet (100 mg total) by mouth 2 (two) times daily. 09/13/20   Nikholas Geffre, Charlesetta Ivory, PA-C   HYDROcodone-acetaminophen (NORCO) 5-325 MG tablet Take 1 tablet by mouth 3 (three) times daily as needed for up to 3 days. 09/13/20 09/16/20  Rhenda Oregon, Charlesetta Ivory, PA-C  hydrOXYzine (ATARAX/VISTARIL) 50 MG tablet Take 50 mg by mouth daily as needed. 09/22/16   [provider]  Lidocaine 2 % GEL Apply 1 application topically daily as needed for up to 10 days. 09/13/20 09/23/20  Sima Lindenberger, Charlesetta Ivory, PA-C  Melatonin 5 MG TABS Take 5 mg by mouth at bedtime as needed.    [provider]  mirtazapine (REMERON) 15 MG tablet Take 15 mg by mouth at bedtime.    [provider]  oxymetazoline (AFRIN) 0.05 % nasal spray Place 1 spray into both nostrils 2 (two) times daily as needed for congestion.    [provider]  polyethylene glycol (MIRALAX / GLYCOLAX) packet Take 17 g by mouth every other day.    [provider]  promethazine (PHENERGAN) 12.5 MG tablet Take 12.5 mg by mouth every 6 (six) hours as needed for nausea or vomiting.    [provider]  sertraline (ZOLOFT) 50 MG tablet Take 50 mg by mouth at bedtime.    [provider]  topiramate (TOPAMAX) 25 MG tablet Take 75 mg by mouth daily.    [provider]  traZODone (DESYREL) 100 MG tablet Take 100 mg by mouth daily. 09/22/16   [provider]    Allergies Penicillins, Strawberry (diagnostic), Peanut-containing drug products, Latex, Sulfa antibiotics, and Vancomycin  History reviewed. No pertinent family history.  Social History Social History   Tobacco Use  . Smoking status: Never Smoker  . Smokeless tobacco: Never Used  Substance Use Topics  . Alcohol use: No  . Drug use: Not on file    Review of Systems  Constitutional: Negative for fever. Eyes: Negative for visual changes. ENT: Negative for sore throat. Cardiovascular: Negative for chest pain. Respiratory: Negative for shortness of breath. Gastrointestinal: Negative for abdominal pain, vomiting  and diarrhea. Genitourinary: Negative for dysuria. Musculoskeletal: Negative for back pain. Skin: Negative for rash.  Chronic skin wound as above. Neurological: Negative for headaches, focal weakness or numbness. ____________________________________________  PHYSICAL EXAM:  VITAL SIGNS: ED Triage Vitals  Enc Vitals Group     BP 09/13/20 0823 134/64     Pulse Rate 09/13/20 0823 79     Resp 09/13/20 0823 16     Temp 09/13/20 0823 98 F (36.7 C)     Temp Source 09/13/20 0823 Oral     SpO2 09/13/20 0823 100 %     Weight --      Height --      Head Circumference --      Peak Flow --      Pain Score 09/13/20 0831 10     Pain Loc --      Pain Edu? --      Excl. in GC? --     Constitutional: Alert and oriented. Well appearing and in no distress. Head: Normocephalic and atraumatic. Eyes: Conjunctivae are normal. Normal extraocular movements Cardiovascular: Normal rate, regular rhythm. Normal distal pulse and cap refill. Respiratory: Normal respiratory effort.  Musculoskeletal: normal composite fist to the left UE.  Nontender with normal range of motion in all extremities.  Neurologic:  Normal gait without ataxia. Normal speech and language. No gross focal neurologic deficits are appreciated. Skin:  Skin is warm, dry and intact. No rash noted. LUE with a volar wound covered by a dried, adhered piece of cotton tape. There is dark, dried blood without induration, fluctuance, or purulence noted.  Patient is also noted to have a large rectangular border area of skin erythema and shininess consistent with probable tape or latex sensitivity. Psychiatric: Mood is flat and affect is anxious. Patient exhibits appropriate insight and judgment. ____________________________________________ LABORATORY  Labs Reviewed  COMPREHENSIVE METABOLIC PANEL  CBC WITH DIFFERENTIAL/PLATELET  URINALYSIS, COMPLETE (UACMP) WITH MICROSCOPIC  SEDIMENTATION RATE  C-REACTIVE PROTEIN   _______________________________________________  PROCEDURES  Lido 2 % jelly topical Wound dressing  Procedures ____________________________________________  INITIAL IMPRESSION / ASSESSMENT AND PLAN / ED COURSE  DDX: chronic cellulitis, cutaneous abscess, erysipelas  Patient presents her self to the ED for evaluation of her chronic left upper semiwound being followed by PCP at [facility].  Patient presents with concern after the provider attempted to remove the bandage yesterday to attempt a wound culture.  Patient presents today with what appears to be paper tape over the active wound.  No purulence or lymphangitis is noted today.  Patient's labs are without signs of leukocytosis at this time.  Inflammatory markers are pending at time of discharge.  Patient refused to allow [place] to remove the tape applied to the forearm at this time.  I attempted to get the patient set up with wound care here at [facility] wound center, but they are booked until January at this time.  Patient is otherwise referred to her primary provider for further management of her chronic wound.  She was agreeable to application topical lidocaine and a nonstick dressing  with a stockinette to help reduce local skin irritation.  A prescription for doxycycline is also provided at this time.  Patient will follow up with primary provider return to the ED if needed.  Addysen Wahba was evaluated in Emergency Department on 09/13/2020 for the symptoms described in the history of present illness. She was evaluated in the context of the global COVID-19 pandemic, which necessitated consideration that the patient might be at risk for infection with the SARS-CoV-2 virus that causes COVID-19. Institutional protocols and algorithms that pertain to the evaluation of patients at risk for COVID-19 are in a state of rapid change based on information released by regulatory bodies including the CDC and federal and state organizations. These policies  and algorithms were followed during the patient's care in the ED.  I reviewed the patient's prescription history over the last 12 months in the multi-state controlled substances database(s) that includes [place], [place], [place], [place], [place], [place], [place], [place], [place], [place], [place], [place], [place], and [place].  Results were notable for no current RX. ____________________________________________  FINAL CLINICAL IMPRESSION(S) / ED DIAGNOSES  Final diagnoses:  Cellulitis of left upper extremity      Karmen Stabs, Charlesetta Ivory, PA-C 09/13/20 1645    Jene Every, MD 09/14/20 1253

## 2020-09-13 NOTE — ED Triage Notes (Addendum)
Reports ongoing wound infection to left forearm greater than 1 year. Saw her PCP yesterday and "bandage was ripped off causing it to bleed and be very painful". Wound approx 3 inches long on inner left forearm with dried blood and old bandage. Pt assisting to unwrap wound as it is very painful. Not taking ABX. Redness limited to area of wound.

## 2020-09-13 NOTE — Discharge Instructions (Signed)
Take the antibiotic as directed. Change your dressing as needed. Follow-up with your PCP for referral to a wound care center. See the [facility] for further wound management.

## 2020-11-29 ENCOUNTER — Ambulatory Visit: Payer: BC Managed Care – PPO | Admitting: Physician Assistant

## 2020-12-06 ENCOUNTER — Other Ambulatory Visit: Payer: Self-pay

## 2020-12-06 ENCOUNTER — Encounter: Payer: BC Managed Care – PPO | Attending: Physician Assistant | Admitting: Physician Assistant

## 2020-12-06 DIAGNOSIS — L98492 Non-pressure chronic ulcer of skin of other sites with fat layer exposed: Secondary | ICD-10-CM | POA: Diagnosis present

## 2020-12-06 DIAGNOSIS — F411 Generalized anxiety disorder: Secondary | ICD-10-CM | POA: Insufficient documentation

## 2020-12-07 NOTE — Progress Notes (Signed)
Chelsea Stanley, Zahriyah ([id number]) Visit Report for 12/06/2020 Chief Complaint Document Details Patient Name: Chelsea Stanley, Chelsea Stanley Date of Service: 12/06/2020 2:30 PM Medical Record Number: [id number] Patient Account Number: [id number] Date of Birth/Sex: 1966/05/24 (55 y.o. Female) Treating RN: Huel CoventryWoody, Kim Primary Care Provider: Flossie BuffyFARRUG, [name] Other Clinician: Referring Provider: Treating Provider/Extender: Rowan BlaseStone, Arrabella Westerman Weeks in Treatment: 0 Information Obtained from: Patient Chief Complaint Left forearm ulcer Electronic Signature(s) Signed: 12/06/2020 5:39:55 PM By: Lenda KelpStone III, Tranquilino Fischler PA-C Entered By: Lenda KelpStone III, Fey Coghill on 12/06/2020 17:39:55 Pratt, Genella RifeSILVIA ([id number]) -------------------------------------------------------------------------------- Debridement Details Patient Name: Chelsea BeathELCIELLO, [name] Date of Service: 12/06/2020 2:30 PM Medical Record Number: [id number] Patient Account Number: [id number] Date of Birth/Sex: 1966/05/24 (54 y.o. Female) Treating RN: Huel CoventryWoody, Kim Primary Care Provider: Flossie BuffyFARRUG, [name] Other Clinician: Referring Provider: Treating Provider/Extender: Rowan BlaseStone, Garwood Wentzell Weeks in Treatment: 0 Debridement Performed for Wound #1 Right Forearm Assessment: Performed By: Physician Nelida MeuseStone, Everson Mott E., PA-C Debridement Type: Chemical/Enzymatic/Mechanical Agent Used: saline and gauze Level of Consciousness (Pre- Awake and Alert procedure): Pre-procedure Verification/Time Out Yes - 04:00 Taken: Pain Control: Lidocaine Instrument: Other : saline and gauze Bleeding: Minimum Hemostasis Achieved: Pressure Response to Treatment: Procedure was tolerated well Level of Consciousness (Post- Awake and Alert procedure): Post Debridement Measurements of Total Wound Length: (cm) 5 Width: (cm) 2.2 Depth: (cm) 0.1 Volume: (cm) 0.864 Character of Wound/Ulcer Post Debridement: Stable Post Procedure Diagnosis Same as Pre-procedure Electronic Signature(s) Signed: 12/06/2020 5:49:41 PM By:  Elliot GurneyWoody, [name], RN, [name], [name], [name] Signed: 12/06/2020 6:06:24 PM By: Lenda KelpStone III, Yisrael Obryan PA-C Entered By: Elliot GurneyWoody, [name], RN, [name], Kim on 12/06/2020 17:49:41 Chelsea BeathELCIELLO, Candus ([id number]) -------------------------------------------------------------------------------- HPI Details Patient Name: Chelsea BeathELCIELLO, Chelsea Stanley Date of Service: 12/06/2020 2:30 PM Medical Record Number: [id number] Patient Account Number: [id number] Date of Birth/Sex: 1966/05/24 (55 y.o. Female) Treating RN: Huel CoventryWoody, Kim Primary Care Provider: Flossie BuffyFARRUG, [name] Other Clinician: Referring Provider: Treating Provider/Extender: Rowan BlaseStone, Marleta Lapierre Weeks in Treatment: 0 History of Present Illness HPI Description: 12/06/2020 upon evaluation today patient appears to be doing somewhat poorly in regard to her left forearm ulcer. She is extremely anxious about the wound and the amount of pain that she is in with that. She tells me that this is a wound that she has had for about 2 years since 2020. She tells me that this was at an site of an IV and then subsequently it what sounds well like infiltrated and she has had the issue since. She has used multiple things topically including Medihoney along with other creams. She has been trying as much as she can to do everything that she has been told to treat this and trying things on her own unfortunately she just has not improved significantly. Nonetheless she does state that she really wants this healed and is willing to do what needs to be in that case. Electronic Signature(s) Signed: 12/06/2020 5:43:56 PM By: Lenda KelpStone III, Jamesina Gaugh PA-C Entered By: Lenda KelpStone III, Jesselle Laflamme on 12/06/2020 17:43:56 Chelsea BeathELCIELLO, Chelsea Stanley ([id number]) -------------------------------------------------------------------------------- Physical Exam Details Patient Name: Chelsea BeathELCIELLO, Chelsea Stanley Date of Service: 12/06/2020 2:30 PM Medical Record Number: [id number] Patient Account Number: [id number] Date of Birth/Sex: 1966/05/24 (55 y.o. Female) Treating RN: Huel CoventryWoody,  Kim Primary Care Provider: Flossie BuffyFARRUG, [name] Other Clinician: Referring Provider: Treating Provider/Extender: Rowan BlaseStone, Chelesa Weingartner Weeks in Treatment: 0 Constitutional sitting or standing blood pressure is within target range for patient.. pulse regular and within target range for patient.Marland Kitchen. respirations regular, non- labored and within target range for patient.Marland Kitchen. temperature within target range for patient.. Well-nourished and well-hydrated in no acute distress. Eyes conjunctiva clear no eyelid edema noted. pupils equal  round and reactive to light and accommodation. Ears, Nose, Mouth, and Throat no gross abnormality of ear auricles or external auditory canals. normal hearing noted during conversation. mucus membranes moist. Respiratory normal breathing without difficulty. Musculoskeletal normal gait and posture. no significant deformity or arthritic changes, no loss or range of motion, no clubbing. Psychiatric this patient is able to make decisions and demonstrates good insight into disease process. Alert and Oriented x 3. patient is agitated and anxious. Notes Upon inspection patient's wound bed actually showed signs of hypergranulation. There does not appear to be any evidence of infection currently which is good news. Her forearm does not appear to be any different from the standpoint of touch with erythema or otherwise as compared to her right forearm. I feel like everything appears nice in that regard. With that being said I do think that she is definitely justified in some of the concerns about infection with this having been open as long as it is but the good news is that I do not think infection is what were dealing with currently. I think we may just need to manage the moisture control little bit better. Electronic Signature(s) Signed: 12/06/2020 5:44:58 PM By: Lenda Kelp PA-C Entered By: Lenda Kelp on 12/06/2020 17:44:58 Chelsea Beath  ([id number]) -------------------------------------------------------------------------------- Physician Orders Details Patient Name: Chelsea Beath Date of Service: 12/06/2020 2:30 PM Medical Record Number: [id number] Patient Account Number: [id number] Date of Birth/Sex: [birth year] (54 y.o. Female) Treating RN: [name] Primary Care Provider: [name] Other Clinician: Referring Provider: Treating Provider/Extender: [name] in Treatment: 0 Verbal / Phone Orders: No Diagnosis Coding ICD-10 Coding Code Description L02.414 Cutaneous abscess of left upper limb L98.492 Non-pressure chronic ulcer of skin of other sites with fat layer exposed F41.1 Generalized anxiety disorder Follow-up Appointments o Return Appointment in 1 week. Bathing/ Shower/ Hygiene o May shower; gently cleanse wound with antibacterial soap, rinse and pat dry prior to dressing wounds Additional Orders / Instructions o Follow Nutritious Diet and Increase Protein Intake Wound Treatment Wound #1 - Forearm Wound Laterality: Right Primary Dressing: Hydrofera Blue Ready Transfer Foam, 2.5x2.5 (in/in) (DME) (Generic) 3 x Per Week/15 Days Discharge Instructions: Apply Hydrofera Blue Ready to wound bed as directed Secondary Dressing: ABD Pad 5x9 (in/in) (DME) (Generic) 3 x Per Week/15 Days Discharge Instructions: Cover with ABD pad Secured With: 17M Medipore H Soft Cloth Surgical Tape, 2x2 (in/yd) 3 x Per Week/15 Days Secured With: Coban Cohesive Bandage 4x5 (yds) Stretched 3 x Per Week/15 Days Discharge Instructions: Apply Coban as directed. Electronic Signature(s) Signed: 12/06/2020 5:58:27 PM By: [name], [name], RN, [name], [name], [name] Signed: 12/06/2020 6:06:24 PM By: Lenda Kelp PA-C Previous Signature: 12/06/2020 5:48:39 PM Version By: [name] [name], RN, [name], [name], [name] Entered By: [name], [name], RN, [name], Kim on 12/06/2020 17:58:26 Chelsea Beath  ([id number]) -------------------------------------------------------------------------------- Problem List Details Patient Name: Chelsea Beath Date of Service: 12/06/2020 2:30 PM Medical Record Number: [id number] Patient Account Number: [id number] Date of Birth/Sex: 06/28/1966 (54 y.o. Female) Treating RN: [name] Primary Care Provider: [name] Other Clinician: Referring Provider: Treating Provider/Extender: [name] in Treatment: 0 Active Problems ICD-10 Encounter Code Description Active Date MDM Diagnosis L02.414 Cutaneous abscess of left upper limb 12/06/2020 No Yes L98.492 Non-pressure chronic ulcer of skin of other sites with fat layer exposed 12/06/2020 No Yes F41.1 Generalized anxiety disorder 12/06/2020 No Yes Inactive Problems Resolved Problems Electronic Signature(s) Signed: 12/06/2020 4:01:31 PM By: Lenda Kelp PA-C Entered By: Lenda Kelp  on 12/06/2020 16:01:31 SHAWNTIA, MANGAL ([id number]) -------------------------------------------------------------------------------- Progress Note Details Patient Name: GLENYCE, RANDLE Date of Service: 12/06/2020 2:30 PM Medical Record Number: [id number] Patient Account Number: [id number] Date of Birth/Sex: Jan 09, 1966 (55 y.o. Female) Treating RN: [name] Primary Care Provider: [name] Other Clinician: Referring Provider: Treating Provider/Extender: [name] in Treatment: 0 Subjective Chief Complaint Information obtained from Patient Left forearm ulcer History of Present Illness (HPI) 12/06/2020 upon evaluation today patient appears to be doing somewhat poorly in regard to her left forearm ulcer. She is extremely anxious about the wound and the amount of pain that she is in with that. She tells me that this is a wound that she has had for about 2 years since 2020. She tells me that this was at an site of an IV and then subsequently it what sounds well like infiltrated and she has had  the issue since. She has used multiple things topically including Medihoney along with other creams. She has been trying as much as she can to do everything that she has been told to treat this and trying things on her own unfortunately she just has not improved significantly. Nonetheless she does state that she really wants this healed and is willing to do what needs to be in that case. Patient History Information obtained from Patient. Allergies penicillin, strawberry, peanut, latex, Sulfa (Sulfonamide Antibiotics), vancomycin, cephalexin (Reaction: rash) Social History Never smoker, Marital Status - Single, Alcohol Use - Never, Drug Use - No History, Caffeine Use - Never. Medical History Eyes Denies history of Cataracts, Glaucoma, Optic Neuritis Ear/Nose/Mouth/Throat Denies history of Chronic sinus problems/congestion, Middle ear problems Hematologic/Lymphatic Denies history of Anemia, Hemophilia, Human Immunodeficiency Virus, Lymphedema, Sickle Cell Disease Respiratory Denies history of Aspiration, Asthma, Chronic Obstructive Pulmonary Disease (COPD), Pneumothorax, Sleep Apnea, Tuberculosis Cardiovascular Patient has history of Myocardial Infarction Denies history of Angina, Arrhythmia, Congestive Heart Failure, Coronary Artery Disease, Deep Vein Thrombosis, Hypertension, Hypotension, Peripheral Arterial Disease, Peripheral Venous Disease, Phlebitis, Vasculitis Gastrointestinal Denies history of Cirrhosis , Colitis, Crohn s, Hepatitis A, Hepatitis B, Hepatitis C Endocrine Denies history of Type I Diabetes, Type II Diabetes Genitourinary Denies history of End Stage Renal Disease Immunological Denies history of Lupus Erythematosus, Raynaud s, Scleroderma Integumentary (Skin) Denies history of History of Burn, History of pressure wounds Musculoskeletal Denies history of Gout, Rheumatoid Arthritis, Osteoarthritis, Osteomyelitis Neurologic Patient has history of Seizure  Disorder Denies history of Dementia, Neuropathy, Quadriplegia, Paraplegia Oncologic Denies history of Received Chemotherapy, Received Radiation Psychiatric Denies history of Anorexia/bulimia, Confinement Anxiety Review of Systems (ROS) Constitutional Symptoms (General Health) Denies complaints or symptoms of Fatigue, Fever, Chills, Marked Weight Change. Eyes Denies complaints or symptoms of Dry Eyes, Vision Changes, Glasses / Contacts. Ear/Nose/Mouth/Throat Denies complaints or symptoms of Difficult clearing ears, Sinusitis. Hematologic/Lymphatic Denies complaints or symptoms of Bleeding / Clotting Disorders, Human Immunodeficiency Virus. Shugrue, Genella Rife ([id number]) Respiratory Denies complaints or symptoms of Chronic or frequent coughs, Shortness of Breath. Cardiovascular Denies complaints or symptoms of Chest pain, LE edema. Gastrointestinal Denies complaints or symptoms of Frequent diarrhea, Nausea, Vomiting. Endocrine Denies complaints or symptoms of Hepatitis, Thyroid disease, Polydypsia (Excessive Thirst). Genitourinary Denies complaints or symptoms of Kidney failure/ Dialysis, Incontinence/dribbling. Immunological Denies complaints or symptoms of Hives, Itching. Integumentary (Skin) Complains or has symptoms of Wounds. Denies complaints or symptoms of Bleeding or bruising tendency, Breakdown, Swelling. Musculoskeletal Denies complaints or symptoms of Muscle Pain, Muscle Weakness. Neurologic Denies complaints or symptoms of Numbness/parasthesias, Focal/Weakness. Psychiatric Denies complaints or symptoms of Anxiety, Claustrophobia. Objective Constitutional  sitting or standing blood pressure is within target range for patient.. pulse regular and within target range for patient.Marland Kitchen respirations regular, non- labored and within target range for patient.Marland Kitchen temperature within target range for patient.. Well-nourished and well-hydrated in no acute distress. Vitals Time Taken:  2:59 PM, Height: 65 in, Source: Stated, Weight: 115 lbs, Source: Stated, BMI: 19.1, Temperature: 98.7 F, Pulse: 105 bpm, Respiratory Rate: 18 breaths/min, Blood Pressure: 132/78 mmHg. Eyes conjunctiva clear no eyelid edema noted. pupils equal round and reactive to light and accommodation. Ears, Nose, Mouth, and Throat no gross abnormality of ear auricles or external auditory canals. normal hearing noted during conversation. mucus membranes moist. Respiratory normal breathing without difficulty. Musculoskeletal normal gait and posture. no significant deformity or arthritic changes, no loss or range of motion, no clubbing. Psychiatric this patient is able to make decisions and demonstrates good insight into disease process. Alert and Oriented x 3. patient is agitated and anxious. General Notes: Upon inspection patient's wound bed actually showed signs of hypergranulation. There does not appear to be any evidence of infection currently which is good news. Her forearm does not appear to be any different from the standpoint of touch with erythema or otherwise as compared to her right forearm. I feel like everything appears nice in that regard. With that being said I do think that she is definitely justified in some of the concerns about infection with this having been open as long as it is but the good news is that I do not think infection is what were dealing with currently. I think we may just need to manage the moisture control little bit better. Integumentary (Hair, Skin) Wound #1 status is Open. Original cause of wound was Gradually Appeared. The wound is located on the Right Forearm. The wound measures 5cm length x 2.2cm width x 0.1cm depth; 8.639cm^2 area and 0.864cm^3 volume. There is Fat Layer (Subcutaneous Tissue) exposed. There is a medium amount of serous drainage noted. The wound margin is flat and intact. There is large (67-100%) pink, hyper - granulation within the wound bed. There  is a small (1-33%) amount of necrotic tissue within the wound bed including Adherent Slough. Assessment Active Problems ICD-10 Cutaneous abscess of left upper limb Wolz, Velvie ([id number]) Non-pressure chronic ulcer of skin of other sites with fat layer exposed Generalized anxiety disorder Plan 1. Would recommend currently that we have the patient go ahead and initiate treatment with the [facility] dressing I think that is good to be the best way to go based on what I am seeing currently. The patient voiced a understanding and agreement to give this a try we explained how to remove this carefully using soap and water in order to prevent it from sticking too badly. Again I also explained that she does not really need anything with too much moisture as to be honest that is going to cause issues with continued hypergranulation. 2. I am also can recommend we continue to monitor for any signs of infection right now I do not see any issues there we can just be using no tape on her but just roll gauze and stretch not to hold in place. 3. I would also recommend that we continue to see her on a semiregular basis in order to make sure this is progressing as well as we would like to see. And again I think that the [facility] is likely getting to do quite well for her. We will see patient back for reevaluation in  1 week here in the clinic. If anything worsens or changes patient will contact our office for additional recommendations. Electronic Signature(s) Signed: 12/06/2020 5:46:51 PM By: Lenda KelpStone III, Lenora Gomes PA-C Entered By: Lenda KelpStone III, Meera Vasco on 12/06/2020 17:46:51 Chelsea BeathELCIELLO, Barba ([id number]) -------------------------------------------------------------------------------- ROS/PFSH Details Patient Name: Chelsea BeathELCIELLO, Tobi Date of Service: 12/06/2020 2:30 PM Medical Record Number: [id number] Patient Account Number: [id number] Date of Birth/Sex: [birth year] (54 y.o. Female) Treating RN: Yevonne PaxEpps,  [name] Primary Care Provider: Flossie BuffyFARRUG, [name] Other Clinician: Referring Provider: Treating Provider/Extender: Rowan BlaseStone, Jachin Coury Weeks in Treatment: 0 Information Obtained From Patient Constitutional Symptoms (General Health) Complaints and Symptoms: Negative for: Fatigue; Fever; Chills; Marked Weight Change Eyes Complaints and Symptoms: Negative for: Dry Eyes; Vision Changes; Glasses / Contacts Medical History: Negative for: Cataracts; Glaucoma; Optic Neuritis Ear/Nose/Mouth/Throat Complaints and Symptoms: Negative for: Difficult clearing ears; Sinusitis Medical History: Negative for: Chronic sinus problems/congestion; Middle ear problems Hematologic/Lymphatic Complaints and Symptoms: Negative for: Bleeding / Clotting Disorders; Human Immunodeficiency Virus Medical History: Negative for: Anemia; Hemophilia; Human Immunodeficiency Virus; Lymphedema; Sickle Cell Disease Respiratory Complaints and Symptoms: Negative for: Chronic or frequent coughs; Shortness of Breath Medical History: Negative for: Aspiration; Asthma; Chronic Obstructive Pulmonary Disease (COPD); Pneumothorax; Sleep Apnea; Tuberculosis Cardiovascular Complaints and Symptoms: Negative for: Chest pain; LE edema Medical History: Positive for: Myocardial Infarction Negative for: Angina; Arrhythmia; Congestive Heart Failure; Coronary Artery Disease; Deep Vein Thrombosis; Hypertension; Hypotension; Peripheral Arterial Disease; Peripheral Venous Disease; Phlebitis; Vasculitis Gastrointestinal Complaints and Symptoms: Negative for: Frequent diarrhea; Nausea; Vomiting Medical History: Negative for: Cirrhosis ; Colitis; Crohnos; Hepatitis A; Hepatitis B; Hepatitis C Endocrine Lebron, Laityn ([id number]) Complaints and Symptoms: Negative for: Hepatitis; Thyroid disease; Polydypsia (Excessive Thirst) Medical History: Negative for: Type I Diabetes; Type II Diabetes Genitourinary Complaints and Symptoms: Negative for:  Kidney failure/ Dialysis; Incontinence/dribbling Medical History: Negative for: End Stage Renal Disease Immunological Complaints and Symptoms: Negative for: Hives; Itching Medical History: Negative for: Lupus Erythematosus; Raynaudos; Scleroderma Integumentary (Skin) Complaints and Symptoms: Positive for: Wounds Negative for: Bleeding or bruising tendency; Breakdown; Swelling Medical History: Negative for: History of Burn; History of pressure wounds Musculoskeletal Complaints and Symptoms: Negative for: Muscle Pain; Muscle Weakness Medical History: Negative for: Gout; Rheumatoid Arthritis; Osteoarthritis; Osteomyelitis Neurologic Complaints and Symptoms: Negative for: Numbness/parasthesias; Focal/Weakness Medical History: Positive for: Seizure Disorder Negative for: Dementia; Neuropathy; Quadriplegia; Paraplegia Psychiatric Complaints and Symptoms: Negative for: Anxiety; Claustrophobia Medical History: Negative for: Anorexia/bulimia; Confinement Anxiety Oncologic Medical History: Negative for: Received Chemotherapy; Received Radiation Immunizations Pneumococcal Vaccine: Received Pneumococcal Vaccination: No Implantable Devices None Family and Social History Chelsea Stanley, Nashya ([id number]) Never smoker; Marital Status - Single; Alcohol Use: Never; Drug Use: No History; Caffeine Use: Never; Financial Concerns: No; Food, Clothing or Shelter Needs: No; Support System Lacking: No; Transportation Concerns: No Electronic Signature(s) Signed: 12/06/2020 6:06:24 PM By: Lenda KelpStone III, Lougenia Morrissey PA-C Signed: 12/07/2020 10:25:07 AM By: Yevonne PaxEpps, [name] RN Entered By: Yevonne PaxEpps, [name] on 12/06/2020 15:10:55 Chelsea BeathELCIELLO, Alexiana ([id number]) -------------------------------------------------------------------------------- SuperBill Details Patient Name: Chelsea BeathELCIELLO, Sherrine Date of Service: 12/06/2020 Medical Record Number: [id number] Patient Account Number: [id number] Date of Birth/Sex: [birth year] (55 y.o.  Female) Treating RN: Huel CoventryWoody, Kim Primary Care Provider: Flossie BuffyFARRUG, [name] Other Clinician: Referring Provider: Treating Provider/Extender: Rowan BlaseStone, Mukesh Kornegay Weeks in Treatment: 0 Diagnosis Coding ICD-10 Codes Code Description L02.414 Cutaneous abscess of left upper limb L98.492 Non-pressure chronic ulcer of skin of other sites with fat layer exposed F41.1 Generalized anxiety disorder Physician Procedures CPT4 Code: [id number] Description: 99213 - WC PHYS LEVEL 3 - EST PT Modifier: Quantity: 1 CPT4 Code: Description: ICD-10 Diagnosis Description L02.414 Cutaneous  abscess of left upper limb L98.492 Non-pressure chronic ulcer of skin of other sites with fat layer expos F41.1 Generalized anxiety disorder Modifier: ed Quantity: Electronic Signature(s) Signed: 12/06/2020 5:47:17 PM By: Lenda Kelp PA-C Entered By: Lenda Kelp on 12/06/2020 17:47:16

## 2020-12-07 NOTE — Progress Notes (Signed)
TANZIE, ROTHSCHILD ([id number]) Visit Report for 12/06/2020 Abuse/Suicide Risk Screen Details Patient Name: Chelsea Stanley, Chelsea Stanley Date of Service: 12/06/2020 2:30 PM Medical Record Number: [id number] Patient Account Number: [id number] Date of Birth/Sex: [birth year] (55 y.o. Female) Treating RN: Yevonne Pax Primary Care Hser Belanger: [name] Other Clinician: Referring Grasiela Jonsson: Treating Delorese Sellin/Extender: [name] in Treatment: 0 Abuse/Suicide Risk Screen Items Answer ABUSE RISK SCREEN: Has anyone close to you tried to hurt or harm you recentlyo No Do you feel uncomfortable with anyone in your familyo No Has anyone forced you do things that you didnot want to doo No Electronic Signature(s) Signed: 12/07/2020 10:25:07 AM By: Yevonne Pax RN Entered By: Yevonne Pax on 12/06/2020 15:11:24 Chelsea Stanley ([id number]) -------------------------------------------------------------------------------- Activities of Daily Living Details Patient Name: Chelsea Stanley Date of Service: 12/06/2020 2:30 PM Medical Record Number: [id number] Patient Account Number: [id number] Date of Birth/Sex: 26-Oct-1966 (55 y.o. Female) Treating RN: Yevonne Pax Primary Care Calbert Hulsebus: [name] Other Clinician: Referring Algenis Ballin: Treating Djimon Lundstrom/Extender: [name] in Treatment: 0 Activities of Daily Living Items Answer Activities of Daily Living (Please select one for each item) Drive Automobile Completely Able Take Medications Completely Able Use Telephone Completely Able Care for Appearance Completely Able Use Toilet Completely Able Bath / Shower Completely Able Dress Self Completely Able Feed Self Completely Able Walk Completely Able Get In / Out Bed Completely Able Housework Completely Able Prepare Meals Completely Able Handle Money Completely Able Shop for Self Completely Able Electronic Signature(s) Signed: 12/07/2020 10:25:07 AM By: Yevonne Pax RN Entered By: Yevonne Pax on 12/06/2020 15:12:04 Chelsea Stanley ([id number]) -------------------------------------------------------------------------------- Education Screening Details Patient Name: Chelsea Stanley Date of Service: 12/06/2020 2:30 PM Medical Record Number: [id number] Patient Account Number: [id number] Date of Birth/Sex: [birth year] (54 y.o. Female) Treating RN: Yevonne Pax Primary Care Chael Urenda: [name] Other Clinician: Referring Placida Cambre: Treating Sladen Plancarte/Extender: [name] in Treatment: 0 Learning Preferences/Education Level/Primary Language Learning Preference: Explanation Highest Education Level: College or Above Preferred Language: English Cognitive Barrier Language Barrier: No Translator Needed: No Memory Deficit: No Emotional Barrier: No Cultural/Religious Beliefs Affecting Medical Care: No Physical Barrier Impaired Vision: No Impaired Hearing: No Decreased Hand dexterity: No Knowledge/Comprehension Knowledge Level: Medium Comprehension Level: Medium Ability to understand written instructions: High Ability to understand verbal instructions: High Motivation Anxiety Level: Anxious Cooperation: Cooperative Education Importance: Acknowledges Need Interest in Health Problems: Asks Questions Perception: Coherent Willingness to Engage in Self-Management High Activities: Readiness to Engage in Self-Management High Activities: Electronic Signature(s) Signed: 12/07/2020 10:25:07 AM By: Yevonne Pax RN Entered By: Yevonne Pax on 12/06/2020 15:13:15 Chelsea Stanley ([id number]) -------------------------------------------------------------------------------- Fall Risk Assessment Details Patient Name: Chelsea Stanley Date of Service: 12/06/2020 2:30 PM Medical Record Number: [id number] Patient Account Number: [id number] Date of Birth/Sex: Aug 23, 1966 (54 y.o. Female) Treating RN: Yevonne Pax Primary Care Halia Franey: [name] Other  Clinician: Referring Bay Jarquin: Treating Howie Rufus/Extender: [name] in Treatment: 0 Fall Risk Assessment Items Have you had 2 or more falls in the last 12 monthso 0 No Have you had any fall that resulted in injury in the last 12 monthso 0 No FALLS RISK SCREEN History of falling - immediate or within 3 months 0 No Secondary diagnosis (Do you have 2 or more medical diagnoseso) 0 No Ambulatory aid None/bed rest/wheelchair/nurse 0 No Crutches/cane/walker 0 No Furniture 0 No Intravenous therapy Access/Saline/Heparin Lock 0 No Gait/Transferring Normal/ bed rest/ wheelchair 0 No Weak (short steps with or without shuffle, stooped but able to lift head while walking, may 0 No seek support from furniture) Impaired (short  steps with shuffle, may have difficulty arising from chair, head down, impaired 0 No balance) Mental Status Oriented to own ability 0 No Electronic Signature(s) Signed: 12/07/2020 10:25:07 AM By: Yevonne Pax RN Entered By: Yevonne Pax on 12/06/2020 15:13:32 Chelsea Stanley ([id number]) -------------------------------------------------------------------------------- Foot Assessment Details Patient Name: Chelsea Stanley Date of Service: 12/06/2020 2:30 PM Medical Record Number: [id number] Patient Account Number: [id number] Date of Birth/Sex: 1966-06-17 (54 y.o. Female) Treating RN: Yevonne Pax Primary Care Tyrann Donaho: [name] Other Clinician: Referring Seena Face: Treating Deckard Stuber/Extender: [name] in Treatment: 0 Foot Assessment Items Site Locations + = Sensation present, - = Sensation absent, C = Callus, U = Ulcer R = Redness, W = Warmth, M = Maceration, PU = Pre-ulcerative lesion F = Fissure, S = Swelling, D = Dryness Assessment Right: Left: Other Deformity: No No Prior Foot Ulcer: No No Prior Amputation: No No Charcot Joint: No No Ambulatory Status: Ambulatory Without Help Gait: Steady Electronic Signature(s) Signed: 12/07/2020  10:25:07 AM By: Yevonne Pax RN Entered By: Yevonne Pax on 12/06/2020 15:14:48 Chelsea Stanley ([id number]) -------------------------------------------------------------------------------- Nutrition Risk Screening Details Patient Name: Chelsea Stanley Date of Service: 12/06/2020 2:30 PM Medical Record Number: [id number] Patient Account Number: [id number] Date of Birth/Sex: 25-Dec-1965 (55 y.o. Female) Treating RN: Yevonne Pax Primary Care Benjamim Harnish: [name] Other Clinician: Referring Neita Landrigan: Treating Addison Whidbee/Extender: [name] in Treatment: 0 Height (in): 65 Weight (lbs): 115 Body Mass Index (BMI): 19.1 Nutrition Risk Screening Items Score Screening NUTRITION RISK SCREEN: I have an illness or condition that made me change the kind and/or amount of food I eat 0 No I eat fewer than two meals per day 0 No I eat few fruits and vegetables, or milk products 0 No I have three or more drinks of beer, liquor or wine almost every day 0 No I have tooth or mouth problems that make it hard for me to eat 0 No I don't always have enough money to buy the food I need 0 No I eat alone most of the time 0 No I take three or more different prescribed or over-the-counter drugs a day 1 Yes Without wanting to, I have lost or gained 10 pounds in the last six months 0 No I am not always physically able to shop, cook and/or feed myself 0 No Nutrition Protocols Good Risk Protocol 0 No interventions needed Moderate Risk Protocol High Risk Proctocol Risk Level: Good Risk Score: 1 Electronic Signature(s) Signed: 12/07/2020 10:25:07 AM By: Yevonne Pax RN Entered By: Yevonne Pax on 12/06/2020 15:14:13

## 2020-12-07 NOTE — Progress Notes (Signed)
ARAYA, ROEL ([id number]) Visit Report for 12/06/2020 Allergy List Details Patient Name: Chelsea Stanley, Chelsea Stanley Date of Service: 12/06/2020 2:30 PM Medical Record Number: [id number] Patient Account Number: [id number] Date of Birth/Sex: [birth year] (55 y.o. Female) Treating RN: Yevonne Pax Primary Care Trevone Prestwood: [name] Other Clinician: Referring Caydin Yeatts: Treating Linsey Arteaga/Extender: [name] in Treatment: 0 Allergies Active Allergies penicillin Type: Food strawberry Type: Food peanut Type: Food latex Type: Food Sulfa (Sulfonamide Antibiotics) Type: Allergen vancomycin Type: Food cephalexin Reaction: rash Type: Food Allergy Notes Electronic Signature(s) Signed: 12/07/2020 9:53:32 AM By: [name], [name], RN, [name], [name], [name] Entered By: [name], [name], RN, [name], Kim on 12/06/2020 16:08:16 [name] ([id number]) -------------------------------------------------------------------------------- Arrival Information Details Patient Name: [name] Date of Service: 12/06/2020 2:30 PM Medical Record Number: [id number] Patient Account Number: [id number] Date of Birth/Sex: 06-Feb-1966 (54 y.o. Female) Treating RN: Yevonne Pax Primary Care Tripp Goins: [name] Other Clinician: Referring Bodin Gorka: Treating Tiarah Shisler/Extender: [name] in Treatment: 0 Visit Information Patient Arrived: Ambulatory Arrival Time: 14:57 Accompanied By: self Transfer Assistance: None Patient Identification Verified: Yes Secondary Verification Process Completed: Yes Patient Requires Transmission-Based Precautions: No Patient Has Alerts: No Electronic Signature(s) Signed: 12/07/2020 10:25:07 AM By: Yevonne Pax RN Entered By: Yevonne Pax on 12/06/2020 14:58:20 [name] ([id number]) -------------------------------------------------------------------------------- Clinic Level of Care Assessment Details Patient Name: [name] Date of Service: 12/06/2020 2:30  PM Medical Record Number: [id number] Patient Account Number: [id number] Date of Birth/Sex: [birth year] (55 y.o. Female) Treating RN: [name] Primary Care Tyrail Grandfield: [name] Other Clinician: Referring Damien Batty: Treating Jayr Lupercio/Extender: [name] in Treatment: 0 Clinic Level of Care Assessment Items TOOL 2 Quantity Score []  - Use when only an EandM is performed on the INITIAL visit 0 ASSESSMENTS - Nursing Assessment / Reassessment X - General Physical Exam (combine w/ comprehensive assessment (listed just below) when performed on new 1 20 pt. evals) X- 1 25 Comprehensive Assessment (HX, ROS, Risk Assessments, Wounds Hx, etc.) ASSESSMENTS - Wound and Skin Assessment / Reassessment X - Simple Wound Assessment / Reassessment - one wound 1 5 []  - 0 Complex Wound Assessment / Reassessment - multiple wounds []  - 0 Dermatologic / Skin Assessment (not related to wound area) ASSESSMENTS - Ostomy and/or Continence Assessment and Care []  - Incontinence Assessment and Management 0 []  - 0 Ostomy Care Assessment and Management (repouching, etc.) PROCESS - Coordination of Care X - Simple Patient / Family Education for ongoing care 1 15 []  - 0 Complex (extensive) Patient / Family Education for ongoing care X- 1 10 Staff obtains , Records, Test Results / Process Orders []  - 0 Staff telephones HHA, Nursing Homes / Clarify orders / etc []  - 0 Routine Transfer to another Facility (non-emergent condition) []  - 0 Routine Hospital Admission (non-emergent condition) X- 1 15 New Admissions / / Ordering NPWT, Apligraf, etc. []  - 0 Emergency Hospital Admission (emergent condition) X- 1 10 Simple Discharge Coordination []  - 0 Complex (extensive) Discharge Coordination PROCESS - Special Needs []  - Pediatric / Minor Patient Management 0 []  - 0 Isolation Patient Management []  - 0 Hearing / Language / Visual special needs []  - 0 Assessment of  Community assistance (transportation, D/C planning, etc.) []  - 0 Additional assistance / Altered mentation []  - 0 Support Surface(s) Assessment (bed, cushion, seat, etc.) INTERVENTIONS - Wound Cleansing / Measurement X - Wound Imaging (photographs - any number of wounds) 1 5 []  - 0 Wound Tracing (instead of photographs) X- 1 5 Simple Wound Measurement - one wound []  -  0 Complex Wound Measurement - multiple wounds Mish, Maryfrances ([id number]) X- 1 5 Simple Wound Cleansing - one wound []  - 0 Complex Wound Cleansing - multiple wounds INTERVENTIONS - Wound Dressings []  - Small Wound Dressing one or multiple wounds 0 []  - 0 Medium Wound Dressing one or multiple wounds X- 1 20 Large Wound Dressing one or multiple wounds []  - 0 Application of Medications - injection INTERVENTIONS - Miscellaneous []  - External ear exam 0 []  - 0 Specimen Collection (cultures, biopsies, blood, body fluids, etc.) []  - 0 Specimen(s) / Culture(s) sent or taken to Lab for analysis []  - 0 Patient Transfer (multiple staff / Nurse, adultHoyer Lift / Similar devices) []  - 0 Simple Staple / Suture removal (25 or less) []  - 0 Complex Staple / Suture removal (26 or more) []  - 0 Hypo / Hyperglycemic Management (close monitor of Blood Glucose) []  - 0 Ankle / Brachial Index (ABI) - do not check if billed separately Has the patient been seen at the hospital within the last three years: Yes Total Score: 135 Level Of Care: New/Established - Level 4 Electronic Signature(s) Signed: 12/07/2020 9:53:32 AM By: Elliot GurneyWoody, [name], RN, [name], [name], [name] Entered By: Elliot GurneyWoody, [name], RN, [name], Kim on 12/06/2020 17:50:33 Ursula BeathELCIELLO, Alexianna ([id number]) -------------------------------------------------------------------------------- Encounter Discharge Information Details Patient Name: Ursula BeathELCIELLO, Jahayra Date of Service: 12/06/2020 2:30 PM Medical Record Number: [id number] Patient Account Number: [id number] Date of Birth/Sex: 06-Mar-1966 (54 y.o.  Female) Treating RN: Huel CoventryWoody, Kim Primary Care Manette Doto: Flossie BuffyFARRUG, [name] Other Clinician: Referring Shakisha Abend: Treating Vicky Mccanless/Extender: Rowan BlaseStone, [name] in Treatment: 0 Encounter Discharge Information Items Post Procedure Vitals Discharge Condition: Stable Unable to obtain vitals Reason: lack of time Ambulatory Status: Ambulatory Discharge Destination: Home Transportation: Private Auto Accompanied By: self Schedule Follow-up Appointment: Yes Clinical Summary of Care: Electronic Signature(s) Signed: 12/06/2020 5:59:08 PM By: Elliot GurneyWoody, [name], RN, [name], [name], [name] Entered By: Elliot GurneyWoody, [name], RN, [name], Kim on 12/06/2020 17:59:08 Ursula BeathELCIELLO, Destynie ([id number]) -------------------------------------------------------------------------------- General Visit Notes Details Patient Name: Ursula BeathELCIELLO, Anuoluwapo Date of Service: 12/06/2020 2:30 PM Medical Record Number: [id number] Patient Account Number: [id number] Date of Birth/Sex: 06-Mar-1966 (54 y.o. Female) Treating RN: Huel CoventryWoody, Kim Primary Care Ender Rorke: Flossie BuffyFARRUG, [name] Other Clinician: Referring Judithe Keetch: Treating Austine Wiedeman/Extender: Rowan BlaseStone, [name] in Treatment: 0 Notes Patient has severe anxiety. It took time for the RN to earn patient's trust and be able assess and treat the wounds. Once patient shared her expectations of no touching without permission and promising to stop immediately if patient said stop, the RN was able to move forward and treat the patient. RN explained the patient's needs to the Marne Meline. The remainder of the visit went well. Electronic Signature(s) Signed: 12/06/2020 5:56:35 PM By: Elliot GurneyWoody, [name], RN, [name], [name], [name] Entered By: Elliot GurneyWoody, [name], RN, [name], Kim on 12/06/2020 17:56:35 Ursula BeathELCIELLO, Ozie ([id number]) -------------------------------------------------------------------------------- Lower Extremity Assessment Details Patient Name: Ursula BeathELCIELLO, Nayzeth Date of Service: 12/06/2020 2:30 PM Medical Record Number: [id number] Patient  Account Number: [id number] Date of Birth/Sex: 06-Mar-1966 (54 y.o. Female) Treating RN: Yevonne PaxEpps, [name] Primary Care Danel Studzinski: Flossie BuffyFARRUG, [name] Other Clinician: Referring Haden Cavenaugh: Treating Yari Szeliga/Extender: Rowan BlaseStone, [name] in Treatment: 0 Electronic Signature(s) Signed: 12/07/2020 10:25:07 AM By: Yevonne PaxEpps, [name] RN Entered By: Yevonne PaxEpps, [name] on 12/06/2020 15:15:24 Ursula BeathELCIELLO, Trenice ([id number]) -------------------------------------------------------------------------------- Multi Wound Chart Details Patient Name: Ursula BeathELCIELLO, Jamille Date of Service: 12/06/2020 2:30 PM Medical Record Number: [id number] Patient Account Number: [id number] Date of Birth/Sex: 06-Mar-1966 (54 y.o. Female) Treating RN: Huel CoventryWoody, Kim Primary Care Buford Bremer: Flossie BuffyFARRUG, [name] Other Clinician: Referring Demarius Archila: Treating Jovannie Ulibarri/Extender: Rowan BlaseStone, [name] in  Treatment: 0 Vital Signs Height(in): 65 Pulse(bpm): 105 Weight(lbs): 115 Blood Pressure(mmHg): 132/78 Body Mass Index(BMI): 19 Temperature(F): 98.7 Respiratory Rate(breaths/min): 18 Photos: [N/A:N/A] Wound Location: Right Forearm N/A N/A Wounding Event: Gradually Appeared N/A N/A Primary Etiology: Abscess N/A N/A Comorbid History: Myocardial Infarction, Seizure N/A N/A Disorder Date Acquired: 07/07/2019 N/A N/A Weeks of Treatment: 0 N/A N/A Wound Status: Open N/A N/A Clustered Wound: Yes N/A N/A Clustered Quantity: 3 N/A N/A Measurements L x W x D (cm) 5x2.2x0.1 N/A N/A Area (cm) : 8.639 N/A N/A Volume (cm) : 0.864 N/A N/A % Reduction in Area: 0.00% N/A N/A % Reduction in Volume: 0.00% N/A N/A Classification: Full Thickness Without Exposed N/A N/A Support Structures Exudate Amount: Medium N/A N/A Exudate Type: Serous N/A N/A Exudate Color: amber N/A N/A Wound Margin: Flat and Intact N/A N/A Granulation Amount: Large (67-100%) N/A N/A Granulation Quality: Pink, Hyper-granulation N/A N/A Necrotic Amount: Small (1-33%) N/A N/A Exposed Structures: Fat  Layer (Subcutaneous Tissue): N/A N/A Yes Fascia: No Tendon: No Muscle: No Joint: No Bone: No Treatment Notes Electronic Signature(s) Signed: 12/06/2020 5:39:27 PM By: [name], [name], RN, [name], [name], [name] Entered By: [name], [name], RN, [name], Kim on 12/06/2020 17:39:26 [name] ([id number]) -------------------------------------------------------------------------------- Multi-Disciplinary Care Plan Details Patient Name: [name] Date of Service: 12/06/2020 2:30 PM Medical Record Number: [id number] Patient Account Number: [id number] Date of Birth/Sex: 1966/09/03 (54 y.o. Female) Treating RN: [name] Primary Care Jaquon Gingerich: [name] Other Clinician: Referring Serene Kopf: Treating Jetson Pickrel/Extender: [name] in Treatment: 0 Active Inactive Orientation to the Wound Care Program Nursing Diagnoses: Knowledge deficit related to the wound healing center program Goals: Patient/caregiver will verbalize understanding of the Wound Healing Center Program Date Initiated: 12/06/2020 Target Resolution Date: 12/06/2020 Goal Status: Active Interventions: Provide education on orientation to the wound center Notes: Wound/Skin Impairment Nursing Diagnoses: Impaired tissue integrity Knowledge deficit related to ulceration/compromised skin integrity Goals: Patient/caregiver will verbalize understanding of skin care regimen Date Initiated: 12/06/2020 Target Resolution Date: 12/06/2020 Goal Status: Active Ulcer/skin breakdown will have a volume reduction of 30% by week 4 Date Initiated: 12/06/2020 Target Resolution Date: 01/03/2021 Goal Status: Active Interventions: Assess patient/caregiver ability to obtain necessary supplies Assess ulceration(s) every visit Provide education on ulcer and skin care Treatment Activities: Referred to DME Lakesha Levinson for dressing supplies : 12/06/2020 Skin care regimen initiated : 12/06/2020 Topical wound management initiated :  12/06/2020 Notes: Electronic Signature(s) Signed: 12/06/2020 5:32:43 PM By: [name], [name], RN, [name], [name], [name] Entered By: [name], [name], RN, [name], Kim on 12/06/2020 17:32:43 [name] ([id number]) -------------------------------------------------------------------------------- Pain Assessment Details Patient Name: [name] Date of Service: 12/06/2020 2:30 PM Medical Record Number: [id number] Patient Account Number: [id number] Date of Birth/Sex: 1966-03-31 (54 y.o. Female) Treating RN: Yevonne Pax Primary Care Sanyia Dini: [name] Other Clinician: Referring Cranston Koors: Treating Hesham Womac/Extender: [name] in Treatment: 0 Active Problems Location of Pain Severity and Description of Pain Patient Has Paino Yes Site Locations Pain Management and Medication Current Pain Management: Electronic Signature(s) Signed: 12/07/2020 10:25:07 AM By: Yevonne Pax RN Entered By: Yevonne Pax on 12/06/2020 14:59:23 [name] ([id number]) -------------------------------------------------------------------------------- Patient/Caregiver Education Details Patient Name: [name] Date of Service: 12/06/2020 2:30 PM Medical Record Number: [id number] Patient Account Number: [id number] Date of Birth/Gender: 12/23/1965 (54 y.o. Female) Treating RN: [name] Primary Care Physician: [name] Other Clinician: Referring Physician: Treating Physician/Extender: [name] in Treatment: 0 Education Assessment Education Provided To: Patient Education Topics Provided Welcome To The Wound Care Center: Handouts: Welcome To The Wound Care  Center Methods: Demonstration, Explain/Verbal Responses: State content correctly Wound/Skin Impairment: Handouts: Caring for Your Ulcer Methods: Demonstration, Explain/Verbal Responses: State content correctly Electronic Signature(s) Signed: 12/07/2020 9:53:32 AM By: [name], [name], RN, [name], [name], [name] Entered By: [name], [name], RN,  [name], Kim on 12/06/2020 17:51:06 [name] ([id number]) -------------------------------------------------------------------------------- Wound Assessment Details Patient Name: [name] Date of Service: 12/06/2020 2:30 PM Medical Record Number: [id number] Patient Account Number: [id number] Date of Birth/Sex: 08-Jul-1966 (54 y.o. Female) Treating RN: Yevonne Pax Primary Care Coady Train: [name] Other Clinician: Referring Rockne Dearinger: Treating Imunique Samad/Extender: [name] in Treatment: 0 Wound Status Wound Number: 1 Primary Etiology: Abscess Wound Location: Right Forearm Wound Status: Open Wounding Event: Gradually Appeared Comorbid History: Myocardial Infarction, Seizure Disorder Date Acquired: 07/07/2019 Weeks Of Treatment: 0 Clustered Wound: Yes Photos Photo Uploaded By: [name], [name], RN, [name], Kim on 12/06/2020 17:35:53 Wound Measurements Length: (cm) 5 Width: (cm) 2.2 Depth: (cm) 0.1 Clustered Quantity: 3 Area: (cm) 8.639 Volume: (cm) 0.864 % Reduction in Area: 0% % Reduction in Volume: 0% Wound Description Classification: Full Thickness Without Exposed Support Structu Wound Margin: Flat and Intact Exudate Amount: Medium Exudate Type: Serous Exudate Color: amber res Foul Odor After Cleansing: No Slough/Fibrino Yes Wound Bed Granulation Amount: Large (67-100%) Exposed Structure Granulation Quality: Pink, Hyper-granulation Fascia Exposed: No Necrotic Amount: Small (1-33%) Fat Layer (Subcutaneous Tissue) Exposed: Yes Necrotic Quality: Adherent Slough Tendon Exposed: No Muscle Exposed: No Joint Exposed: No Bone Exposed: No Treatment Notes Wound #1 (Forearm) Wound Laterality: Right Cleanser Peri-Wound Care HADESSAH, GRENNAN ([id number]) Topical Primary Dressing Hydrofera Blue Ready Transfer Foam, 2.5x2.5 (in/in) Discharge Instruction: Apply Hydrofera Blue Ready to wound bed as directed Secondary Dressing ABD Pad 5x9 (in/in) Discharge  Instruction: Cover with ABD pad Secured With 88M Medipore H Soft Cloth Surgical Tape, 2x2 (in/yd) Coban Cohesive Bandage 4x5 (yds) Stretched Discharge Instruction: Apply Coban as directed. Compression Wrap Compression Stockings Add-Ons Electronic Signature(s) Signed: 12/07/2020 10:25:07 AM By: Yevonne Pax RN Entered By: Yevonne Pax on 12/06/2020 15:38:32 [name] ([id number]) -------------------------------------------------------------------------------- Vitals Details Patient Name: [name] Date of Service: 12/06/2020 2:30 PM Medical Record Number: [id number] Patient Account Number: [id number] Date of Birth/Sex: Jan 27, 1966 (55 y.o. Female) Treating RN: Yevonne Pax Primary Care Akiva Brassfield: [name] Other Clinician: Referring Seville Downs: Treating Shareena Nusz/Extender: [name] in Treatment: 0 Vital Signs Time Taken: 14:59 Temperature (F): 98.7 Height (in): 65 Pulse (bpm): 105 Source: Stated Respiratory Rate (breaths/min): 18 Weight (lbs): 115 Blood Pressure (mmHg): 132/78 Source: Stated Reference Range: 80 - 120 mg / dl Body Mass Index (BMI): 19.1 Electronic Signature(s) Signed: 12/07/2020 10:25:07 AM By: Yevonne Pax RN Entered By: Yevonne Pax on 12/06/2020 15:01:45

## 2020-12-14 ENCOUNTER — Encounter: Payer: BC Managed Care – PPO | Admitting: Physician Assistant

## 2020-12-14 ENCOUNTER — Other Ambulatory Visit: Payer: Self-pay

## 2020-12-14 DIAGNOSIS — L98492 Non-pressure chronic ulcer of skin of other sites with fat layer exposed: Secondary | ICD-10-CM | POA: Diagnosis not present

## 2020-12-14 NOTE — Progress Notes (Addendum)
Chelsea Stanley, Chelsea Stanley ([id number]) Visit Report for 12/14/2020 Chief Complaint Document Details Patient Name: Chelsea Stanley Date of Service: 12/14/2020 10:00 AM Medical Record Number: [id number] Patient Account Number: [id number] Date of Birth/Sex: Mar 24, 1966 (55 y.o. F) Treating RN: [name] Primary Care Provider: [name] Other Clinician: Referring Provider: [name] Treating Provider/Extender: [name] in Treatment: 1 Information Obtained from: Patient Chief Complaint Left forearm ulcer Electronic Signature(s) Signed: 12/14/2020 10:15:40 AM By: Lenda Kelp PA-C Entered By: Lenda Kelp on 12/14/2020 10:15:40 Chelsea Stanley ([id number]) -------------------------------------------------------------------------------- HPI Details Patient Name: Chelsea Stanley Date of Service: 12/14/2020 10:00 AM Medical Record Number: [id number] Patient Account Number: [id number] Date of Birth/Sex: 08-04-1966 (54 y.o. F) Treating RN: [name] Primary Care Provider: [name] Other Clinician: Referring Provider: [name] Treating Provider/Extender: [name] in Treatment: 1 History of Present Illness HPI Description: 12/06/2020 upon evaluation today patient appears to be doing somewhat poorly in regard to her left forearm ulcer. She is extremely anxious about the wound and the amount of pain that she is in with that. She tells me that this is a wound that she has had for about 2 years since 2020. She tells me that this was at an site of an IV and then subsequently it what sounds well like infiltrated and she has had the issue since. She has used multiple things topically including Medihoney along with other creams. She has been trying as much as she can to do everything that she has been told to treat this and trying things on her own unfortunately she just has not improved significantly. Nonetheless she does state that she really wants this healed and  is willing to do what needs to be in that case. 12/14/2020 upon evaluation today patient appears to be doing excellent in regard to her wound. I feel like the hypergranulation is dramatically improved as compared to last time. She overall seems to be making great progress in my opinion. I do not see any evidence of active infection at this time which is great news and overall the coloring around the wound looks dramatically improved today. Electronic Signature(s) Signed: 12/14/2020 3:39:29 PM By: Lenda Kelp PA-C Entered By: Lenda Kelp on 12/14/2020 15:39:29 Chelsea Stanley ([id number]) -------------------------------------------------------------------------------- Physical Exam Details Patient Name: Chelsea Stanley Date of Service: 12/14/2020 10:00 AM Medical Record Number: [id number] Patient Account Number: [id number] Date of Birth/Sex: 10-Jan-1966 (54 y.o. F) Treating RN: [name] Primary Care Provider: [name] Other Clinician: Referring Provider: [name] Treating Provider/Extender: [name] in Treatment: 1 Constitutional Well-nourished and well-hydrated in no acute distress. Respiratory normal breathing without difficulty. Psychiatric this patient is able to make decisions and demonstrates good insight into disease process. Alert and Oriented x 3. pleasant and cooperative. Notes Upon inspection patient's wound bed actually showed signs of excellent granulation epithelization. I been very pleased with how the patient seems to be making progress in just 1 week's time. I do believe that we can continue to monitor the wound and again honestly I think the [facility] is doing a great job for her. Electronic Signature(s) Signed: 12/14/2020 3:39:50 PM By: Lenda Kelp PA-C Entered By: Lenda Kelp on 12/14/2020 15:39:50 Chelsea Stanley ([id number]) -------------------------------------------------------------------------------- Physician Orders  Details Patient Name: Chelsea Stanley Date of Service: 12/14/2020 10:00 AM Medical Record Number: [id number] Patient Account Number: [id number] Date of Birth/Sex: 1966-05-19 (54 y.o. F) Treating RN: Yevonne Pax Primary Care Provider: [name] Other Clinician: Referring Provider: [name] Treating Provider/Extender: Larina Bras,  Jake Samples in Treatment: 1 Verbal / Phone Orders: No Diagnosis Coding ICD-10 Coding Code Description L02.414 Cutaneous abscess of left upper limb L98.492 Non-pressure chronic ulcer of skin of other sites with fat layer exposed F41.1 Generalized anxiety disorder Follow-up Appointments o Return Appointment in 2 weeks. Bathing/ Shower/ Hygiene o May shower; gently cleanse wound with antibacterial soap, rinse and pat dry prior to dressing wounds Additional Orders / Instructions o Follow Nutritious Diet and Increase Protein Intake Wound Treatment Wound #1 - Forearm Wound Laterality: Right Primary Dressing: Hydrofera Blue Ready Transfer Foam, 2.5x2.5 (in/in) (Generic) 3 x Per Week/15 Days Discharge Instructions: Apply Hydrofera Blue Ready to wound bed as directed Secondary Dressing: ABD Pad 5x9 (in/in) (Generic) 3 x Per Week/15 Days Discharge Instructions: Cover with ABD pad Secured With: 50M Medipore H Soft Cloth Surgical Tape, 2x2 (in/yd) 3 x Per Week/15 Days Secured With: Coban Cohesive Bandage 4x5 (yds) Stretched 3 x Per Week/15 Days Discharge Instructions: Apply Coban as directed. Electronic Signature(s) Signed: 12/14/2020 4:54:01 PM By: Lenda Kelp PA-C Signed: 12/17/2020 7:56:30 AM By: Yevonne Pax RN Entered By: Yevonne Pax on 12/14/2020 10:39:48 Chelsea Stanley ([id number]) -------------------------------------------------------------------------------- Problem List Details Patient Name: Chelsea Stanley Date of Service: 12/14/2020 10:00 AM Medical Record Number: [id number] Patient Account Number: [id number] Date of Birth/Sex:  1966/07/01 (55 y.o. F) Treating RN: [name] Primary Care Provider: [name] Other Clinician: Referring Provider: [name] Treating Provider/Extender: [name] in Treatment: 1 Active Problems ICD-10 Encounter Code Description Active Date MDM Diagnosis L02.414 Cutaneous abscess of left upper limb 12/06/2020 No Yes L98.492 Non-pressure chronic ulcer of skin of other sites with fat layer exposed 12/06/2020 No Yes F41.1 Generalized anxiety disorder 12/06/2020 No Yes Inactive Problems Resolved Problems Electronic Signature(s) Signed: 12/14/2020 10:15:34 AM By: Lenda Kelp PA-C Entered By: Lenda Kelp on 12/14/2020 10:15:34 Chelsea Stanley ([id number]) -------------------------------------------------------------------------------- Progress Note Details Patient Name: Chelsea Stanley Date of Service: 12/14/2020 10:00 AM Medical Record Number: [id number] Patient Account Number: [id number] Date of Birth/Sex: July 10, 1966 (54 y.o. F) Treating RN: [name] Primary Care Provider: [name] Other Clinician: Referring Provider: [name] Treating Provider/Extender: [name] in Treatment: 1 Subjective Chief Complaint Information obtained from Patient Left forearm ulcer History of Present Illness (HPI) 12/06/2020 upon evaluation today patient appears to be doing somewhat poorly in regard to her left forearm ulcer. She is extremely anxious about the wound and the amount of pain that she is in with that. She tells me that this is a wound that she has had for about 2 years since 2020. She tells me that this was at an site of an IV and then subsequently it what sounds well like infiltrated and she has had the issue since. She has used multiple things topically including Medihoney along with other creams. She has been trying as much as she can to do everything that she has been told to treat this and trying things on her own unfortunately she just has  not improved significantly. Nonetheless she does state that she really wants this healed and is willing to do what needs to be in that case. 12/14/2020 upon evaluation today patient appears to be doing excellent in regard to her wound. I feel like the hypergranulation is dramatically improved as compared to last time. She overall seems to be making great progress in my opinion. I do not see any evidence of active infection at this time which is great news and overall the coloring around the wound looks dramatically  improved today. Objective Constitutional Well-nourished and well-hydrated in no acute distress. Vitals Time Taken: 10:11 AM, Height: 65 in, Weight: 115 lbs, BMI: 19.1, Temperature: 97.8 F, Pulse: 75 bpm, Respiratory Rate: 18 breaths/min, Blood Pressure: 109/71 mmHg. Respiratory normal breathing without difficulty. Psychiatric this patient is able to make decisions and demonstrates good insight into disease process. Alert and Oriented x 3. pleasant and cooperative. General Notes: Upon inspection patient's wound bed actually showed signs of excellent granulation epithelization. I been very pleased with how the patient seems to be making progress in just 1 week's time. I do believe that we can continue to monitor the wound and again honestly I think the [facility] is doing a great job for her. Integumentary (Hair, Skin) Wound #1 status is Open. Original cause of wound was Gradually Appeared. The wound is located on the Right Forearm. The wound measures 5cm length x 1.8cm width x 0.1cm depth; 7.069cm^2 area and 0.707cm^3 volume. There is Fat Layer (Subcutaneous Tissue) exposed. There is no tunneling or undermining noted. There is a medium amount of serous drainage noted. The wound margin is flat and intact. There is large (67- 100%) pink, hyper - granulation within the wound bed. There is a small (1-33%) amount of necrotic tissue within the wound bed including Adherent  Slough. Assessment Active Problems ICD-10 Cutaneous abscess of left upper limb Non-pressure chronic ulcer of skin of other sites with fat layer exposed Generalized anxiety disorder Chelsea Stanley, Chelsea Stanley ([id number]) Plan Follow-up Appointments: Return Appointment in 2 weeks. Bathing/ Shower/ Hygiene: May shower; gently cleanse wound with antibacterial soap, rinse and pat dry prior to dressing wounds Additional Orders / Instructions: Follow Nutritious Diet and Increase Protein Intake WOUND #1: - Forearm Wound Laterality: Right Primary Dressing: Hydrofera Blue Ready Transfer Foam, 2.5x2.5 (in/in) (Generic) 3 x Per Week/15 Days Discharge Instructions: Apply Hydrofera Blue Ready to wound bed as directed Secondary Dressing: ABD Pad 5x9 (in/in) (Generic) 3 x Per Week/15 Days Discharge Instructions: Cover with ABD pad Secured With: 2M Medipore H Soft Cloth Surgical Tape, 2x2 (in/yd) 3 x Per Week/15 Days Secured With: Coban Cohesive Bandage 4x5 (yds) Stretched 3 x Per Week/15 Days Discharge Instructions: Apply Coban as directed. 1. Would recommend currently that we continue with the [facility] I think this is still the best option as far as getting her to heal and to be honest she is made excellent progress in the past week which has me greatly encouraged. 2. Also can recommend that we continue with the wound care measures specifically with regard to the roll gauze followed by extraction at the cover I think that is doing a great job as well. We will see patient back for reevaluation in 2 weeks here in the clinic. If anything worsens or changes patient will contact our office for additional recommendations. Electronic Signature(s) Signed: 12/14/2020 3:40:19 PM By: Lenda Kelp PA-C Entered By: Lenda Kelp on 12/14/2020 15:40:19 Chelsea Stanley ([id number]) -------------------------------------------------------------------------------- SuperBill Details Patient Name: Chelsea Stanley Date of Service: 12/14/2020 Medical Record Number: [id number] Patient Account Number: [id number] Date of Birth/Sex: 04/12/1966 (55 y.o. F) Treating RN: Yevonne Pax Primary Care Provider: [name] Other Clinician: Referring Provider: [name] Treating Provider/Extender: [name] in Treatment: 1 Diagnosis Coding ICD-10 Codes Code Description L02.414 Cutaneous abscess of left upper limb L98.492 Non-pressure chronic ulcer of skin of other sites with fat layer exposed F41.1 Generalized anxiety disorder Facility Procedures CPT4 Code: 50932671 Description: 24580 - WOUND CARE VISIT-LEV 2 EST PT Modifier: Quantity: 1  Physician Procedures CPT4 Code: 1610960 Description: 99213 - WC PHYS LEVEL 3 - EST PT Modifier: Quantity: 1 CPT4 Code: Description: ICD-10 Diagnosis Description L02.414 Cutaneous abscess of left upper limb L98.492 Non-pressure chronic ulcer of skin of other sites with fat layer expos F41.1 Generalized anxiety disorder Modifier: ed Quantity: Electronic Signature(s) Signed: 12/14/2020 3:40:30 PM By: Lenda Kelp PA-C Entered By: Lenda Kelp on 12/14/2020 15:40:30

## 2020-12-17 NOTE — Progress Notes (Signed)
Chelsea, Stanley ([id number]) Visit Report for 12/14/2020 Arrival Information Details Patient Name: Chelsea Stanley, Chelsea Stanley Date of Service: 12/14/2020 10:00 AM Medical Record Number: [id number] Patient Account Number: [id number] Date of Birth/Sex: 1966/07/08 (55 y.o. F) Treating RN: Yevonne Pax Primary Care Latravion Graves: [name] Other Clinician: Referring Kimbly Eanes: [name] Treating Bosco Paparella/Extender: [name] in Treatment: 1 Visit Information History Since Last Visit All ordered tests and consults were completed: No Patient Arrived: Ambulatory Added or deleted any medications: No Arrival Time: 10:05 Any new allergies or adverse reactions: No Accompanied By: self Had a fall or experienced change in No Transfer Assistance: None activities of daily living that may affect Patient Identification Verified: Yes risk of falls: Secondary Verification Process Completed: Yes Signs or symptoms of abuse/neglect since last visito No Patient Requires Transmission-Based Precautions: No Hospitalized since last visit: No Patient Has Alerts: No Implantable device outside of the clinic excluding No cellular tissue based products placed in the center since last visit: Has Dressing in Place as Prescribed: Yes Pain Present Now: No Electronic Signature(s) Signed: 12/17/2020 7:56:30 AM By: Yevonne Pax RN Entered By: Yevonne Pax on 12/14/2020 10:10:58 Chelsea Stanley ([id number]) -------------------------------------------------------------------------------- Clinic Level of Care Assessment Details Patient Name: Chelsea Stanley Date of Service: 12/14/2020 10:00 AM Medical Record Number: [id number] Patient Account Number: [id number] Date of Birth/Sex: Nov 27, 1965 (55 y.o. F) Treating RN: Yevonne Pax Primary Care Valgene Deloatch: [name] Other Clinician: Referring Brantly Kalman: [name] Treating Normon Pettijohn/Extender: [name] in Treatment: 1 Clinic Level of Care Assessment  Items TOOL 4 Quantity Score X - Use when only an EandM is performed on FOLLOW-UP visit 1 0 ASSESSMENTS - Nursing Assessment / Reassessment []  - Reassessment of Co-morbidities (includes updates in patient status) 0 []  - 0 Reassessment of Adherence to Treatment Plan ASSESSMENTS - Wound and Skin Assessment / Reassessment X - Simple Wound Assessment / Reassessment - one wound 1 5 []  - 0 Complex Wound Assessment / Reassessment - multiple wounds []  - 0 Dermatologic / Skin Assessment (not related to wound area) ASSESSMENTS - Focused Assessment []  - Circumferential Edema Measurements - multi extremities 0 []  - 0 Nutritional Assessment / Counseling / Intervention []  - 0 Lower Extremity Assessment (monofilament, tuning fork, pulses) []  - 0 Peripheral Arterial Disease Assessment (using hand held doppler) ASSESSMENTS - Ostomy and/or Continence Assessment and Care []  - Incontinence Assessment and Management 0 []  - 0 Ostomy Care Assessment and Management (repouching, etc.) PROCESS - Coordination of Care X - Simple Patient / Family Education for ongoing care 1 15 []  - 0 Complex (extensive) Patient / Family Education for ongoing care []  - 0 Staff obtains , Records, Test Results / Process Orders []  - 0 Staff telephones HHA, Nursing Homes / Clarify orders / etc []  - 0 Routine Transfer to another Facility (non-emergent condition) []  - 0 Routine Hospital Admission (non-emergent condition) []  - 0 New Admissions / / Ordering NPWT, Apligraf, etc. []  - 0 Emergency Hospital Admission (emergent condition) X- 1 10 Simple Discharge Coordination []  - 0 Complex (extensive) Discharge Coordination PROCESS - Special Needs []  - Pediatric / Minor Patient Management 0 []  - 0 Isolation Patient Management []  - 0 Hearing / Language / Visual special needs []  - 0 Assessment of Community assistance (transportation, D/C planning, etc.) []  - 0 Additional assistance /  Altered mentation []  - 0 Support Surface(s) Assessment (bed, cushion, seat, etc.) INTERVENTIONS - Wound Cleansing / Measurement Ravi, Sherrelle ( ) X- 1 5 Simple Wound Cleansing - one wound []  - 0 Complex  Wound Cleansing - multiple wounds X- 1 5 Wound Imaging (photographs - any number of wounds) []  - 0 Wound Tracing (instead of photographs) X- 1 5 Simple Wound Measurement - one wound []  - 0 Complex Wound Measurement - multiple wounds INTERVENTIONS - Wound Dressings []  - Small Wound Dressing one or multiple wounds 0 X- 1 15 Medium Wound Dressing one or multiple wounds []  - 0 Large Wound Dressing one or multiple wounds X- 1 5 Application of Medications - topical []  - 0 Application of Medications - injection INTERVENTIONS - Miscellaneous []  - External ear exam 0 []  - 0 Specimen Collection (cultures, biopsies, blood, body fluids, etc.) []  - 0 Specimen(s) / Culture(s) sent or taken to Lab for analysis []  - 0 Patient Transfer (multiple staff / Nurse, adult / Similar devices) []  - 0 Simple Staple / Suture removal (25 or less) []  - 0 Complex Staple / Suture removal (26 or more) []  - 0 Hypo / Hyperglycemic Management (close monitor of Blood Glucose) []  - 0 Ankle / Brachial Index (ABI) - do not check if billed separately X- 1 5 Vital Signs Has the patient been seen at the hospital within the last three years: Yes Total Score: 70 Level Of Care: New/Established - Level 2 Electronic Signature(s) Signed: 12/17/2020 7:56:30 AM By: Yevonne Pax RN Entered By: Yevonne Pax on 12/14/2020 10:32:48 Chelsea Stanley ([id number]) -------------------------------------------------------------------------------- Encounter Discharge Information Details Patient Name: Chelsea Stanley Date of Service: 12/14/2020 10:00 AM Medical Record Number: [id number] Patient Account Number: [id number] Date of Birth/Sex: 01/14/1966 (55 y.o. F) Treating RN: Yevonne Pax Primary Care Cordero Surette:  [name] Other Clinician: Referring Blaklee Shores: [name] Treating Branden Vine/Extender: [name] in Treatment: 1 Encounter Discharge Information Items Discharge Condition: Stable Ambulatory Status: Ambulatory Discharge Destination: Home Transportation: Private Auto Accompanied By: self Schedule Follow-up Appointment: Yes Clinical Summary of Care: Patient Declined Electronic Signature(s) Signed: 12/17/2020 7:56:30 AM By: Yevonne Pax RN Entered By: Yevonne Pax on 12/14/2020 10:42:54 Chelsea Stanley ([id number]) -------------------------------------------------------------------------------- Lower Extremity Assessment Details Patient Name: Chelsea Stanley Date of Service: 12/14/2020 10:00 AM Medical Record Number: [id number] Patient Account Number: [id number] Date of Birth/Sex: Apr 12, 1966 (55 y.o. F) Treating RN: Yevonne Pax Primary Care Aryel Edelen: [name] Other Clinician: Referring Alexiz Sustaita: [name] Treating Artavia Jeanlouis/Extender: [name] in Treatment: 1 Electronic Signature(s) Signed: 12/17/2020 7:56:30 AM By: Yevonne Pax RN Entered By: Yevonne Pax on 12/14/2020 16:00:30 Chelsea Stanley ([id number]) -------------------------------------------------------------------------------- Multi Wound Chart Details Patient Name: Chelsea Stanley Date of Service: 12/14/2020 10:00 AM Medical Record Number: [id number] Patient Account Number: [id number] Date of Birth/Sex: 04/30/1966 (54 y.o. F) Treating RN: Yevonne Pax Primary Care Kaybree Williams: [name] Other Clinician: Referring Briseis Aguilera: [name] Treating Abiel Antrim/Extender: [name] in Treatment: 1 Vital Signs Height(in): 65 Pulse(bpm): 75 Weight(lbs): 115 Blood Pressure(mmHg): 109/71 Body Mass Index(BMI): 19 Temperature(F): 97.8 Respiratory Rate(breaths/min): 18 Photos: [N/A:N/A] Wound Location: Right Forearm N/A N/A Wounding Event: Gradually Appeared N/A N/A Primary  Etiology: Abscess N/A N/A Comorbid History: Myocardial Infarction, Seizure N/A N/A Disorder Date Acquired: 07/07/2019 N/A N/A Weeks of Treatment: 1 N/A N/A Wound Status: Open N/A N/A Clustered Wound: Yes N/A N/A Clustered Quantity: 3 N/A N/A Measurements L x W x D (cm) 5x1.8x0.1 N/A N/A Area (cm) : 7.069 N/A N/A Volume (cm) : 0.707 N/A N/A % Reduction in Area: 18.20% N/A N/A % Reduction in Volume: 18.20% N/A N/A Classification: Full Thickness Without Exposed N/A N/A Support Structures Exudate Amount: Medium N/A N/A Exudate Type: Serous N/A N/A Exudate Color: amber N/A N/A Wound  Margin: Flat and Intact N/A N/A Granulation Amount: Large (67-100%) N/A N/A Granulation Quality: Pink, Hyper-granulation N/A N/A Necrotic Amount: Small (1-33%) N/A N/A Exposed Structures: Fat Layer (Subcutaneous Tissue): N/A N/A Yes Fascia: No Tendon: No Muscle: No Joint: No Bone: No Epithelialization: None N/A N/A Treatment Notes Electronic Signature(s) Signed: 12/17/2020 7:56:30 AM By: Yevonne Pax RN Entered By: Yevonne Pax on 12/14/2020 10:30:43 Chelsea Stanley ([id number]) Chelsea Stanley ([id number]) -------------------------------------------------------------------------------- Multi-Disciplinary Care Plan Details Patient Name: Chelsea Stanley Date of Service: 12/14/2020 10:00 AM Medical Record Number: [id number] Patient Account Number: [id number] Date of Birth/Sex: [birth year] (54 y.o. F) Treating RN: Yevonne Pax Primary Care Clemente Dewey: [name] Other Clinician: Referring Shandiin Eisenbeis: [name] Treating Asianae Minkler/Extender: [name] in Treatment: 1 Active Inactive Wound/Skin Impairment Nursing Diagnoses: Impaired tissue integrity Knowledge deficit related to ulceration/compromised skin integrity Goals: Patient/caregiver will verbalize understanding of skin care regimen Date Initiated: 12/06/2020 Target Resolution Date: 01/06/2021 Goal Status: Active Ulcer/skin  breakdown will have a volume reduction of 30% by week 4 Date Initiated: 12/06/2020 Target Resolution Date: 01/03/2021 Goal Status: Active Interventions: Assess patient/caregiver ability to obtain necessary supplies Assess ulceration(s) every visit Provide education on ulcer and skin care Treatment Activities: Referred to DME Mandrell Vangilder for dressing supplies : 12/06/2020 Skin care regimen initiated : 12/06/2020 Topical wound management initiated : 12/06/2020 Notes: Electronic Signature(s) Signed: 12/17/2020 7:56:30 AM By: Yevonne Pax RN Entered By: Yevonne Pax on 12/14/2020 10:30:22 Chelsea Stanley ([id number]) -------------------------------------------------------------------------------- Pain Assessment Details Patient Name: Chelsea Stanley Date of Service: 12/14/2020 10:00 AM Medical Record Number: [id number] Patient Account Number: [id number] Date of Birth/Sex: [birth year] (55 y.o. F) Treating RN: Yevonne Pax Primary Care Kesi Perrow: [name] Other Clinician: Referring Aubriauna Riner: [name] Treating Kengo Sturges/Extender: [name] in Treatment: 1 Active Problems Location of Pain Severity and Description of Pain Patient Has Paino No Site Locations Pain Management and Medication Current Pain Management: Electronic Signature(s) Signed: 12/17/2020 7:56:30 AM By: Yevonne Pax RN Entered By: Yevonne Pax on 12/14/2020 10:11:37 Chelsea Stanley ([id number]) -------------------------------------------------------------------------------- Patient/Caregiver Education Details Patient Name: Chelsea Stanley Date of Service: 12/14/2020 10:00 AM Medical Record Number: [id number] Patient Account Number: [id number] Date of Birth/Gender: 1966/03/19 (55 y.o. F) Treating RN: Yevonne Pax Primary Care Physician: [name] Other Clinician: Referring Physician: [name] Treating Physician/Extender: [name] in Treatment: 1 Education Assessment Education Provided  To: Patient Education Topics Provided Wound/Skin Impairment: Methods: Explain/Verbal Responses: State content correctly Electronic Signature(s) Signed: 12/17/2020 7:56:30 AM By: Yevonne Pax RN Entered By: Yevonne Pax on 12/14/2020 10:33:36 Chelsea Stanley ([id number]) -------------------------------------------------------------------------------- Wound Assessment Details Patient Name: Chelsea Stanley Date of Service: 12/14/2020 10:00 AM Medical Record Number: [id number] Patient Account Number: [id number] Date of Birth/Sex: 02/14/1966 (54 y.o. F) Treating RN: Yevonne Pax Primary Care Annie Roseboom: [name] Other Clinician: Referring Amirrah Quigley: [name] Treating Armanda Forand/Extender: [name] in Treatment: 1 Wound Status Wound Number: 1 Primary Etiology: Abscess Wound Location: Right Forearm Wound Status: Open Wounding Event: Gradually Appeared Comorbid History: Myocardial Infarction, Seizure Disorder Date Acquired: 07/07/2019 Weeks Of Treatment: 1 Clustered Wound: Yes Photos Wound Measurements Length: (cm) 5 Width: (cm) 1.8 Depth: (cm) 0.1 Clustered Quantity: 3 Area: (cm) 7.069 Volume: (cm) 0.707 % Reduction in Area: 18.2% % Reduction in Volume: 18.2% Epithelialization: None Tunneling: No Undermining: No Wound Description Classification: Full Thickness Without Exposed Support Structures Wound Margin: Flat and Intact Exudate Amount: Medium Exudate Type: Serous Exudate Color: amber Foul Odor After Cleansing: No Slough/Fibrino Yes Wound Bed Granulation Amount: Large (67-100%) Exposed Structure Granulation Quality: Pink, Hyper-granulation Fascia Exposed: No  Necrotic Amount: Small (1-33%) Fat Layer (Subcutaneous Tissue) Exposed: Yes Necrotic Quality: Adherent Slough Tendon Exposed: No Muscle Exposed: No Joint Exposed: No Bone Exposed: No Treatment Notes Wound #1 (Forearm) Wound Laterality: Right Cleanser Peri-Wound Care DEANNDRA, KIRLEY  ([id number]) Topical Primary Dressing Hydrofera Blue Ready Transfer Foam, 2.5x2.5 (in/in) Discharge Instruction: Apply Hydrofera Blue Ready to wound bed as directed Secondary Dressing ABD Pad 5x9 (in/in) Discharge Instruction: Cover with ABD pad Secured With 78M Medipore H Soft Cloth Surgical Tape, 2x2 (in/yd) Coban Cohesive Bandage 4x5 (yds) Stretched Discharge Instruction: Apply Coban as directed. Compression Wrap Compression Stockings Add-Ons Electronic Signature(s) Signed: 12/17/2020 7:56:30 AM By: Yevonne Pax RN Entered By: Yevonne Pax on 12/14/2020 10:19:39 Chelsea Stanley ([id number]) -------------------------------------------------------------------------------- Vitals Details Patient Name: Chelsea Stanley Date of Service: 12/14/2020 10:00 AM Medical Record Number: [id number] Patient Account Number: [id number] Date of Birth/Sex: 06-12-1966 (55 y.o. F) Treating RN: Yevonne Pax Primary Care Tonica Brasington: [name] Other Clinician: Referring Brecken Walth: [name] Treating Nikolette Reindl/Extender: [name] in Treatment: 1 Vital Signs Time Taken: 10:11 Temperature (F): 97.8 Height (in): 65 Pulse (bpm): 75 Weight (lbs): 115 Respiratory Rate (breaths/min): 18 Body Mass Index (BMI): 19.1 Blood Pressure (mmHg): 109/71 Reference Range: 80 - 120 mg / dl Electronic Signature(s) Signed: 12/17/2020 7:56:30 AM By: Yevonne Pax RN Entered By: Yevonne Pax on 12/14/2020 10:11:31

## 2020-12-21 ENCOUNTER — Ambulatory Visit: Payer: BC Managed Care – PPO | Admitting: Physician Assistant

## 2020-12-28 ENCOUNTER — Encounter: Payer: BC Managed Care – PPO | Attending: Physician Assistant | Admitting: Physician Assistant

## 2020-12-28 ENCOUNTER — Other Ambulatory Visit: Payer: Self-pay

## 2020-12-28 DIAGNOSIS — L98492 Non-pressure chronic ulcer of skin of other sites with fat layer exposed: Secondary | ICD-10-CM | POA: Insufficient documentation

## 2020-12-28 NOTE — Progress Notes (Addendum)
Chelsea Stanley, Chelsea Stanley ([id number]) Visit Report for 12/28/2020 Chief Complaint Document Details Patient Name: Chelsea Stanley, Chelsea Stanley Date of Service: 12/28/2020 10:15 AM Medical Record Number: [id number] Patient Account Number: [id number] Date of Birth/Sex: [birth year] (55 y.o. F) Treating RN: Yevonne Pax Primary Care Provider: [name] Other Clinician: Referring Provider: [name] Treating Provider/Extender: [name] in Treatment: 3 Information Obtained from: Patient Chief Complaint Left forearm ulcer Electronic Signature(s) Signed: 12/28/2020 10:28:57 AM By: Lenda Kelp PA-C Entered By: Lenda Kelp on 12/28/2020 10:28:57 Chelsea Stanley ([id number]) -------------------------------------------------------------------------------- HPI Details Patient Name: Chelsea Stanley Date of Service: 12/28/2020 10:15 AM Medical Record Number: [id number] Patient Account Number: [id number] Date of Birth/Sex: May 13, 1966 (54 y.o. F) Treating RN: Yevonne Pax Primary Care Provider: [name] Other Clinician: Referring Provider: [name] Treating Provider/Extender: [name] in Treatment: 3 History of Present Illness HPI Description: 12/06/2020 upon evaluation today patient appears to be doing somewhat poorly in regard to her left forearm ulcer. She is extremely anxious about the wound and the amount of pain that she is in with that. She tells me that this is a wound that she has had for about 2 years since 2020. She tells me that this was at an site of an IV and then subsequently it what sounds well like infiltrated and she has had the issue since. She has used multiple things topically including Medihoney along with other creams. She has been trying as much as she can to do everything that she has been told to treat this and trying things on her own unfortunately she just has not improved significantly. Nonetheless she does state that she really wants this healed  and is willing to do what needs to be in that case. 12/14/2020 upon evaluation today patient appears to be doing excellent in regard to her wound. I feel like the hypergranulation is dramatically improved as compared to last time. She overall seems to be making great progress in my opinion. I do not see any evidence of active infection at this time which is great news and overall the coloring around the wound looks dramatically improved today. 12/28/2020 upon evaluation today patient appears to be doing well with regard to her wounds. In fact these are measuring significantly smaller on her forearm and overall I think that she is making great progress. She tells me that it feels a lot better and she is extremely pleased. Fortunately there is no sign of active infection at this time. No fevers, chills, nausea, vomiting, or diarrhea. Electronic Signature(s) Signed: 12/28/2020 11:14:27 AM By: Lenda Kelp PA-C Entered By: Lenda Kelp on 12/28/2020 11:14:26 Chelsea Stanley ([id number]) -------------------------------------------------------------------------------- Physical Exam Details Patient Name: Chelsea Stanley Date of Service: 12/28/2020 10:15 AM Medical Record Number: [id number] Patient Account Number: [id number] Date of Birth/Sex: 1966/07/24 (54 y.o. F) Treating RN: Yevonne Pax Primary Care Provider: [name] Other Clinician: Referring Provider: [name] Treating Provider/Extender: [name] in Treatment: 3 Constitutional Well-nourished and well-hydrated in no acute distress. Respiratory normal breathing without difficulty. Psychiatric this patient is able to make decisions and demonstrates good insight into disease process. Alert and Oriented x 3. pleasant and cooperative. Notes Upon inspection patient's wound bed actually showed signs of good granulation epithelization. There does not appear to be any evidence of active infection which is great news and  overall I feel like she is making great progress. The patient is very pleased that things do seem to be doing so well. Electronic Signature(s) Signed: 12/28/2020 11:14:43 AM By:  Linwood Dibbles, Maddelynn Moosman PA-C Entered By: Lenda Kelp on 12/28/2020 11:14:43 Chelsea Stanley ([id number]) -------------------------------------------------------------------------------- Physician Orders Details Patient Name: Chelsea Stanley Date of Service: 12/28/2020 10:15 AM Medical Record Number: [id number] Patient Account Number: [id number] Date of Birth/Sex: 10/26/1966 (55 y.o. F) Treating RN: Yevonne Pax Primary Care Provider: [name] Other Clinician: Referring Provider: [name] Treating Provider/Extender: [name] in Treatment: 3 Verbal / Phone Orders: No Diagnosis Coding ICD-10 Coding Code Description L02.414 Cutaneous abscess of left upper limb L98.492 Non-pressure chronic ulcer of skin of other sites with fat layer exposed F41.1 Generalized anxiety disorder Follow-up Appointments o Return Appointment in 2 weeks. Bathing/ Shower/ Hygiene o May shower; gently cleanse wound with antibacterial soap, rinse and pat dry prior to dressing wounds Additional Orders / Instructions o Follow Nutritious Diet and Increase Protein Intake Wound Treatment Wound #1 - Forearm Wound Laterality: Right Primary Dressing: Hydrofera Blue Ready Transfer Foam, 2.5x2.5 (in/in) (Generic) 3 x Per Week/15 Days Discharge Instructions: Apply Hydrofera Blue Ready to wound bed as directed Secondary Dressing: ABD Pad 5x9 (in/in) (Generic) 3 x Per Week/15 Days Discharge Instructions: Cover with ABD pad Secured With: 107M Medipore H Soft Cloth Surgical Tape, 2x2 (in/yd) 3 x Per Week/15 Days Secured With: Coban Cohesive Bandage 4x5 (yds) Stretched (DME) (Generic) 3 x Per Week/15 Days Discharge Instructions: Apply Coban as directed. Electronic Signature(s) Signed: 12/28/2020 4:44:17 PM By: Lenda Kelp  PA-C Signed: 12/31/2020 4:08:54 PM By: Yevonne Pax RN Entered By: Yevonne Pax on 12/28/2020 13:27:47 Chelsea Stanley ([id number]) -------------------------------------------------------------------------------- Problem List Details Patient Name: Chelsea Stanley Date of Service: 12/28/2020 10:15 AM Medical Record Number: [id number] Patient Account Number: [id number] Date of Birth/Sex: 30-Mar-1966 (55 y.o. F) Treating RN: Yevonne Pax Primary Care Provider: [name] Other Clinician: Referring Provider: [name] Treating Provider/Extender: [name] in Treatment: 3 Active Problems ICD-10 Encounter Code Description Active Date MDM Diagnosis L02.414 Cutaneous abscess of left upper limb 12/06/2020 No Yes L98.492 Non-pressure chronic ulcer of skin of other sites with fat layer exposed 12/06/2020 No Yes F41.1 Generalized anxiety disorder 12/06/2020 No Yes Inactive Problems Resolved Problems Electronic Signature(s) Signed: 12/28/2020 10:28:50 AM By: Lenda Kelp PA-C Entered By: Lenda Kelp on 12/28/2020 10:28:50 Chelsea Stanley ([id number]) -------------------------------------------------------------------------------- Progress Note Details Patient Name: Chelsea Stanley Date of Service: 12/28/2020 10:15 AM Medical Record Number: [id number] Patient Account Number: [id number] Date of Birth/Sex: [birth year] (54 y.o. F) Treating RN: Yevonne Pax Primary Care Provider: [name] Other Clinician: Referring Provider: [name] Treating Provider/Extender: [name] in Treatment: 3 Subjective Chief Complaint Information obtained from Patient Left forearm ulcer History of Present Illness (HPI) 12/06/2020 upon evaluation today patient appears to be doing somewhat poorly in regard to her left forearm ulcer. She is extremely anxious about the wound and the amount of pain that she is in with that. She tells me that this is a wound that she has had for  about 2 years since 2020. She tells me that this was at an site of an IV and then subsequently it what sounds well like infiltrated and she has had the issue since. She has used multiple things topically including Medihoney along with other creams. She has been trying as much as she can to do everything that she has been told to treat this and trying things on her own unfortunately she just has not improved significantly. Nonetheless she does state that she really wants this healed and is willing to do what needs to be in that case.  12/14/2020 upon evaluation today patient appears to be doing excellent in regard to her wound. I feel like the hypergranulation is dramatically improved as compared to last time. She overall seems to be making great progress in my opinion. I do not see any evidence of active infection at this time which is great news and overall the coloring around the wound looks dramatically improved today. 12/28/2020 upon evaluation today patient appears to be doing well with regard to her wounds. In fact these are measuring significantly smaller on her forearm and overall I think that she is making great progress. She tells me that it feels a lot better and she is extremely pleased. Fortunately there is no sign of active infection at this time. No fevers, chills, nausea, vomiting, or diarrhea. Objective Constitutional Well-nourished and well-hydrated in no acute distress. Vitals Time Taken: 10:20 AM, Height: 65 in, Weight: 115 lbs, BMI: 19.1, Temperature: 98 F, Pulse: 71 bpm, Respiratory Rate: 18 breaths/min, Blood Pressure: 116/74 mmHg. Respiratory normal breathing without difficulty. Psychiatric this patient is able to make decisions and demonstrates good insight into disease process. Alert and Oriented x 3. pleasant and cooperative. General Notes: Upon inspection patient's wound bed actually showed signs of good granulation epithelization. There does not appear to be  any evidence of active infection which is great news and overall I feel like she is making great progress. The patient is very pleased that things do seem to be doing so well. Integumentary (Hair, Skin) Wound #1 status is Open. Original cause of wound was Gradually Appeared. The wound is located on the Right Forearm. The wound measures 3.5cm length x 0.6cm width x 0.1cm depth; 1.649cm^2 area and 0.165cm^3 volume. There is Fat Layer (Subcutaneous Tissue) exposed. There is no tunneling or undermining noted. There is a medium amount of serous drainage noted. The wound margin is flat and intact. There is large (67-100%) pink, hyper - granulation within the wound bed. There is a small (1-33%) amount of necrotic tissue within the wound bed including Adherent Slough. Assessment Active Problems ICD-10 Chelsea Stanley, Chelsea Stanley ([id number]) Cutaneous abscess of left upper limb Non-pressure chronic ulcer of skin of other sites with fat layer exposed Generalized anxiety disorder Plan Follow-up Appointments: Return Appointment in 2 weeks. Bathing/ Shower/ Hygiene: May shower; gently cleanse wound with antibacterial soap, rinse and pat dry prior to dressing wounds Additional Orders / Instructions: Follow Nutritious Diet and Increase Protein Intake WOUND #1: - Forearm Wound Laterality: Right Primary Dressing: Hydrofera Blue Ready Transfer Foam, 2.5x2.5 (in/in) (Generic) 3 x Per Week/15 Days Discharge Instructions: Apply Hydrofera Blue Ready to wound bed as directed Secondary Dressing: ABD Pad 5x9 (in/in) (Generic) 3 x Per Week/15 Days Discharge Instructions: Cover with ABD pad Secured With: 53M Medipore H Soft Cloth Surgical Tape, 2x2 (in/yd) 3 x Per Week/15 Days Secured With: Coban Cohesive Bandage 4x5 (yds) Stretched 3 x Per Week/15 Days Discharge Instructions: Apply Coban as directed. 1. I would recommend currently that we going to continue with the wound care measures as before and she is in agreement  the plan this includes the use of the [facility] which has done great up to this point. 2. I am also can recommend that we have the patient continue to monitor for any signs of infection right now obviously there is no evidence of infection and I feel like that she is doing extremely well. We will see patient back for reevaluation in 2 weeks here in the clinic. If anything worsens or changes patient will  contact our office for additional recommendations. Electronic Signature(s) Signed: 12/28/2020 11:15:30 AM By: Lenda Kelp PA-C Previous Signature: 12/28/2020 11:15:16 AM Version By: Lenda Kelp PA-C Entered By: Lenda Kelp on 12/28/2020 11:15:29 Chelsea Stanley ([id number]) -------------------------------------------------------------------------------- SuperBill Details Patient Name: Chelsea Stanley Date of Service: 12/28/2020 Medical Record Number: [id number] Patient Account Number: [id number] Date of Birth/Sex: Jul 08, 1966 (54 y.o. F) Treating RN: Yevonne Pax Primary Care Provider: [name] Other Clinician: Referring Provider: [name] Treating Provider/Extender: [name] in Treatment: 3 Diagnosis Coding ICD-10 Codes Code Description L02.414 Cutaneous abscess of left upper limb L98.492 Non-pressure chronic ulcer of skin of other sites with fat layer exposed F41.1 Generalized anxiety disorder Facility Procedures CPT4 Code: [id number] Description: 99213 - WOUND CARE VISIT-LEV 3 EST PT Modifier: Quantity: 1 Physician Procedures CPT4 Code: 8299371 Description: 99213 - WC PHYS LEVEL 3 - EST PT Modifier: Quantity: 1 CPT4 Code: Description: ICD-10 Diagnosis Description L02.414 Cutaneous abscess of left upper limb L98.492 Non-pressure chronic ulcer of skin of other sites with fat layer expos F41.1 Generalized anxiety disorder Modifier: ed Quantity: Electronic Signature(s) Signed: 12/28/2020 11:15:39 AM By: Lenda Kelp PA-C Entered By: Lenda Kelp on 12/28/2020 11:15:39

## 2020-12-31 NOTE — Progress Notes (Signed)
HAILLEE, JOHANN ([id number]) Visit Report for 12/28/2020 Arrival Information Details Patient Name: Chelsea Stanley, Chelsea Stanley Date of Service: 12/28/2020 10:15 AM Medical Record Number: [id number] Patient Account Number: [id number] Date of Birth/Sex: [birth year] (55 y.o. F) Treating RN: Yevonne Pax Primary Care Latria Mccarron: [name] Other Clinician: Referring Kellyn Mccary: [name] Treating Samson Ralph/Extender: [name] in Treatment: 3 Visit Information History Since Last Visit All ordered tests and consults were completed: No Patient Arrived: Ambulatory Added or deleted any medications: No Arrival Time: 10:19 Any new allergies or adverse reactions: No Accompanied By: self Had a fall or experienced change in No Transfer Assistance: None activities of daily living that may affect Patient Identification Verified: Yes risk of falls: Secondary Verification Process Completed: Yes Signs or symptoms of abuse/neglect since last visito No Patient Requires Transmission-Based Precautions: No Hospitalized since last visit: No Patient Has Alerts: No Implantable device outside of the clinic excluding No cellular tissue based products placed in the center since last visit: Has Dressing in Place as Prescribed: Yes Pain Present Now: Yes Electronic Signature(s) Signed: 12/31/2020 4:08:54 PM By: Yevonne Pax RN Entered By: Yevonne Pax on 12/28/2020 10:19:52 Chelsea Stanley ([id number]) -------------------------------------------------------------------------------- Clinic Level of Care Assessment Details Patient Name: Chelsea Stanley Date of Service: 12/28/2020 10:15 AM Medical Record Number: [id number] Patient Account Number: [id number] Date of Birth/Sex: 01/03/1966 (55 y.o. F) Treating RN: Yevonne Pax Primary Care Horace Wishon: [name] Other Clinician: Referring Jonas Goh: [name] Treating Ifeoluwa Beller/Extender: [name] in Treatment: 3 Clinic Level of Care Assessment  Items TOOL 4 Quantity Score X - Use when only an EandM is performed on FOLLOW-UP visit 1 0 ASSESSMENTS - Nursing Assessment / Reassessment X - Reassessment of Co-morbidities (includes updates in patient status) 1 10 X- 1 5 Reassessment of Adherence to Treatment Plan ASSESSMENTS - Wound and Skin Assessment / Reassessment X - Simple Wound Assessment / Reassessment - one wound 1 5 []  - 0 Complex Wound Assessment / Reassessment - multiple wounds []  - 0 Dermatologic / Skin Assessment (not related to wound area) ASSESSMENTS - Focused Assessment []  - Circumferential Edema Measurements - multi extremities 0 []  - 0 Nutritional Assessment / Counseling / Intervention []  - 0 Lower Extremity Assessment (monofilament, tuning fork, pulses) []  - 0 Peripheral Arterial Disease Assessment (using hand held doppler) ASSESSMENTS - Ostomy and/or Continence Assessment and Care []  - Incontinence Assessment and Management 0 []  - 0 Ostomy Care Assessment and Management (repouching, etc.) PROCESS - Coordination of Care X - Simple Patient / Family Education for ongoing care 1 15 []  - 0 Complex (extensive) Patient / Family Education for ongoing care X- 1 10 Staff obtains , Records, Test Results / Process Orders []  - 0 Staff telephones HHA, Nursing Homes / Clarify orders / etc []  - 0 Routine Transfer to another Facility (non-emergent condition) []  - 0 Routine Hospital Admission (non-emergent condition) []  - 0 New Admissions / / Ordering NPWT, Apligraf, etc. []  - 0 Emergency Hospital Admission (emergent condition) X- 1 10 Simple Discharge Coordination []  - 0 Complex (extensive) Discharge Coordination PROCESS - Special Needs []  - Pediatric / Minor Patient Management 0 []  - 0 Isolation Patient Management []  - 0 Hearing / Language / Visual special needs []  - 0 Assessment of Community assistance (transportation, D/C planning, etc.) []  - 0 Additional assistance /  Altered mentation []  - 0 Support Surface(s) Assessment (bed, cushion, seat, etc.) INTERVENTIONS - Wound Cleansing / Measurement Stachowski, Shelbee ( ) X- 1 5 Simple Wound Cleansing - one wound []  - 0  Complex Wound Cleansing - multiple wounds X- 1 5 Wound Imaging (photographs - any number of wounds) []  - 0 Wound Tracing (instead of photographs) X- 1 5 Simple Wound Measurement - one wound []  - 0 Complex Wound Measurement - multiple wounds INTERVENTIONS - Wound Dressings []  - Small Wound Dressing one or multiple wounds 0 X- 1 15 Medium Wound Dressing one or multiple wounds []  - 0 Large Wound Dressing one or multiple wounds X- 1 5 Application of Medications - topical []  - 0 Application of Medications - injection INTERVENTIONS - Miscellaneous []  - External ear exam 0 []  - 0 Specimen Collection (cultures, biopsies, blood, body fluids, etc.) []  - 0 Specimen(s) / Culture(s) sent or taken to Lab for analysis []  - 0 Patient Transfer (multiple staff / / Similar devices) []  - 0 Simple Staple / Suture removal (25 or less) []  - 0 Complex Staple / Suture removal (26 or more) []  - 0 Hypo / Hyperglycemic Management (close monitor of Blood Glucose) []  - 0 Ankle / Brachial Index (ABI) - do not check if billed separately X- 1 5 Vital Signs Has the patient been seen at the hospital within the last three years: Yes Total Score: 95 Level Of Care: New/Established - Level 3 Electronic Signature(s) Signed: 12/31/2020 4:08:54 PM By: RN Entered By: on 12/28/2020 10:38:05 ( ) -------------------------------------------------------------------------------- Lower Extremity Assessment Details Patient Name: Date of Service: 12/28/2020 10:15 AM Medical Record Number: Nurse, adult Patient Account Number: Date of Birth/Sex: 1966-08-30 (55 y.o. F) Treating RN: Primary Care Shanquita Ronning:  01/02/2021 Other Clinician: Referring Abeeha Twist: Yevonne Pax Treating Andriea Hasegawa/Extender: Yevonne Pax in Treatment: 3 Electronic Signature(s) Signed: 12/31/2020 4:08:54 PM By: Chelsea Beath RN Entered By: [id number] on 12/28/2020 10:32:48 02/25/2021 ([id number]) -------------------------------------------------------------------------------- Multi Wound Chart Details Patient Name: [id number] Date of Service: 12/28/2020 10:15 AM Medical Record Number: [id number] Patient Account Number: Yevonne Pax Date of Birth/Sex: 03/04/1966 (54 y.o. F) Treating RN: [name] Primary Care Verba Ainley: 01/02/2021 Other Clinician: Referring Marta Bouie: Yevonne Pax Treating Chiron Campione/Extender: Yevonne Pax in Treatment: 3 Vital Signs Height(in): 65 Pulse(bpm): 71 Weight(lbs): 115 Blood Pressure(mmHg): 116/74 Body Mass Index(BMI): 19 Temperature(F): 98 Respiratory Rate(breaths/min): 18 Photos: [N/A:N/A] Wound Location: Right Forearm N/A N/A Wounding Event: Gradually Appeared N/A N/A Primary Etiology: Abscess N/A N/A Comorbid History: Myocardial Infarction, Seizure N/A N/A Disorder Date Acquired: 07/07/2019 N/A N/A Weeks of Treatment: 3 N/A N/A Wound Status: Open N/A N/A Clustered Wound: Yes N/A N/A Clustered Quantity: 3 N/A N/A Measurements L x W x D (cm) 3.5x0.6x0.1 N/A N/A Area (cm) : 1.649 N/A N/A Volume (cm) : 0.165 N/A N/A % Reduction in Area: 80.90% N/A N/A % Reduction in Volume: 80.90% N/A N/A Classification: Full Thickness Without Exposed N/A N/A Support Structures Exudate Amount: Medium N/A N/A Exudate Type: Serous N/A N/A Exudate Color: amber N/A N/A Wound Margin: Flat and Intact N/A N/A Granulation Amount: Large (67-100%) N/A N/A Granulation Quality: Pink, Hyper-granulation N/A N/A Necrotic Amount: Small (1-33%) N/A N/A Exposed Structures: Fat Layer (Subcutaneous Tissue): N/A N/A Yes Fascia: No Tendon: No Muscle: No Joint: No Bone:  No Epithelialization: Medium (34-66%) N/A N/A Treatment Notes Electronic Signature(s) Signed: 12/31/2020 4:08:54 PM By: [id number] RN Entered By: Chelsea Stanley on 12/28/2020 10:36:15 [id number] ([id number]) [birth year]) -------------------------------------------------------------------------------- Multi-Disciplinary Care Plan Details Patient Name: Yevonne Pax Date of Service: 12/28/2020 10:15 AM Medical Record Number: [name] Patient Account Number: [name] Date of Birth/Sex: [birth year] (54  y.o. F) Treating RN: Yevonne Pax Primary Care Shiloh Southern: [name] Other Clinician: Referring Norrine Ballester: [name] Treating Edsel Shives/Extender: [name] in Treatment: 3 Active Inactive Wound/Skin Impairment Nursing Diagnoses: Impaired tissue integrity Knowledge deficit related to ulceration/compromised skin integrity Goals: Patient/caregiver will verbalize understanding of skin care regimen Date Initiated: 12/06/2020 Target Resolution Date: 01/06/2021 Goal Status: Active Ulcer/skin breakdown will have a volume reduction of 30% by week 4 Date Initiated: 12/06/2020 Target Resolution Date: 01/03/2021 Goal Status: Active Interventions: Assess patient/caregiver ability to obtain necessary supplies Assess ulceration(s) every visit Provide education on ulcer and skin care Treatment Activities: Referred to DME Judeth Gilles for dressing supplies : 12/06/2020 Skin care regimen initiated : 12/06/2020 Topical wound management initiated : 12/06/2020 Notes: Electronic Signature(s) Signed: 12/31/2020 4:08:54 PM By: Yevonne Pax RN Entered By: Yevonne Pax on 12/28/2020 10:35:40 Chelsea Stanley ([id number]) -------------------------------------------------------------------------------- Pain Assessment Details Patient Name: Chelsea Stanley Date of Service: 12/28/2020 10:15 AM Medical Record Number: [id number] Patient Account Number: [id number] Date of  Birth/Sex: [birth year] (55 y.o. F) Treating RN: Yevonne Pax Primary Care Elray Dains: [name] Other Clinician: Referring Kamare Caspers: [name] Treating Azrael Maddix/Extender: [name] in Treatment: 3 Active Problems Location of Pain Severity and Description of Pain Patient Has Paino Yes Site Locations With Dressing Change: Yes Duration of the Pain. Constant / Intermittento Constant Rate the pain. Current Pain Level: 5 Worst Pain Level: 8 Least Pain Level: 2 Tolerable Pain Level: 5 Character of Pain Describe the Pain: Burning, Stabbing Pain Management and Medication Current Pain Management: Medication: No Cold Application: No Rest: Yes Massage: No Activity: No T.E.N.S.: No Heat Application: No Leg drop or elevation: No Is the Current Pain Management Adequate: Inadequate How does your wound impact your activities of daily livingo Sleep: Yes Bathing: No Appetite: No Relationship With Others: No Bladder Continence: No Emotions: No Bowel Continence: No Work: No Toileting: No Drive: No Dressing: No Hobbies: No Electronic Signature(s) Signed: 12/31/2020 4:08:54 PM By: Yevonne Pax RN Entered By: Yevonne Pax on 12/28/2020 10:21:48 Chelsea Stanley ([id number]) -------------------------------------------------------------------------------- Patient/Caregiver Education Details Patient Name: Chelsea Stanley Date of Service: 12/28/2020 10:15 AM Medical Record Number: [id number] Patient Account Number: [id number] Date of Birth/Gender: 1965/12/09 (55 y.o. F) Treating RN: Yevonne Pax Primary Care Physician: [name] Other Clinician: Referring Physician: [name] Treating Physician/Extender: [name] in Treatment: 3 Education Assessment Education Provided To: Patient Education Topics Provided Wound/Skin Impairment: Methods: Explain/Verbal Responses: State content correctly Electronic Signature(s) Signed: 12/31/2020 4:08:54 PM By: Yevonne Pax RN Entered By: Yevonne Pax on 12/28/2020 10:38:38 Chelsea Stanley ([id number]) -------------------------------------------------------------------------------- Wound Assessment Details Patient Name: Chelsea Stanley Date of Service: 12/28/2020 10:15 AM Medical Record Number: [id number] Patient Account Number: [id number] Date of Birth/Sex: [birth year] (54 y.o. F) Treating RN: Yevonne Pax Primary Care Geoffry Bannister: [name] Other Clinician: Referring Robertta Halfhill: [name] Treating Dalina Samara/Extender: [name] in Treatment: 3 Wound Status Wound Number: 1 Primary Etiology: Abscess Wound Location: Right Forearm Wound Status: Open Wounding Event: Gradually Appeared Comorbid History: Myocardial Infarction, Seizure Disorder Date Acquired: 07/07/2019 Weeks Of Treatment: 3 Clustered Wound: Yes Photos Wound Measurements Length: (cm) 3.5 Width: (cm) 0.6 Depth: (cm) 0.1 Clustered Quantity: 3 Area: (cm) 1.649 Volume: (cm) 0.165 % Reduction in Area: 80.9% % Reduction in Volume: 80.9% Epithelialization: Medium (34-66%) Tunneling: No Undermining: No Wound Description Classification: Full Thickness Without Exposed Support Structures Wound Margin: Flat and Intact Exudate Amount: Medium Exudate Type: Serous Exudate Color: amber Foul Odor After Cleansing: No Slough/Fibrino Yes Wound Bed Granulation Amount: Large (67-100%) Exposed Structure Granulation  Quality: Pink, Hyper-granulation Fascia Exposed: No Necrotic Amount: Small (1-33%) Fat Layer (Subcutaneous Tissue) Exposed: Yes Necrotic Quality: Adherent Slough Tendon Exposed: No Muscle Exposed: No Joint Exposed: No Bone Exposed: No Electronic Signature(s) Signed: 12/31/2020 4:08:54 PM By: Yevonne PaxEpps, [name] RN Entered By: Yevonne PaxEpps, [name] on 12/28/2020 10:32:34 Chelsea BeathELCIELLO, Denean ([id number]) -------------------------------------------------------------------------------- Vitals Details Patient Name: Chelsea BeathELCIELLO,  Amiee Date of Service: 12/28/2020 10:15 AM Medical Record Number: [id number] Patient Account Number: [id number] Date of Birth/Sex: [birth year] (55 y.o. F) Treating RN: Yevonne PaxEpps, [name] Primary Care Jeffery Gammell: Flossie BuffyFARRUG, [name] Other Clinician: Referring Alejandra Hunt: Flossie BuffyFARRUG, [name] Treating Avyana Puffenbarger/Extender: Rowan BlaseStone, [name] in Treatment: 3 Vital Signs Time Taken: 10:20 Temperature (F): 98 Height (in): 65 Pulse (bpm): 71 Weight (lbs): 115 Respiratory Rate (breaths/min): 18 Body Mass Index (BMI): 19.1 Blood Pressure (mmHg): 116/74 Reference Range: 80 - 120 mg / dl Electronic Signature(s) Signed: 12/31/2020 4:08:54 PM By: Yevonne PaxEpps, [name] RN Entered By: Yevonne PaxEpps, [name] on 12/28/2020 10:20:20

## 2021-01-04 ENCOUNTER — Ambulatory Visit: Payer: BC Managed Care – PPO | Admitting: Physician Assistant

## 2021-01-11 ENCOUNTER — Ambulatory Visit: Payer: BC Managed Care – PPO | Admitting: Physician Assistant

## 2021-01-18 ENCOUNTER — Other Ambulatory Visit: Payer: Self-pay

## 2021-01-18 ENCOUNTER — Encounter: Payer: BC Managed Care – PPO | Attending: Physician Assistant | Admitting: Physician Assistant

## 2021-01-18 DIAGNOSIS — L98492 Non-pressure chronic ulcer of skin of other sites with fat layer exposed: Secondary | ICD-10-CM | POA: Diagnosis not present

## 2021-01-18 DIAGNOSIS — L98499 Non-pressure chronic ulcer of skin of other sites with unspecified severity: Secondary | ICD-10-CM | POA: Diagnosis present

## 2021-01-18 DIAGNOSIS — F411 Generalized anxiety disorder: Secondary | ICD-10-CM | POA: Diagnosis not present

## 2021-01-18 DIAGNOSIS — L02414 Cutaneous abscess of left upper limb: Secondary | ICD-10-CM | POA: Insufficient documentation

## 2021-01-18 NOTE — Progress Notes (Addendum)
Chelsea Stanley, Chelsea Stanley ([id number]) Visit Report for 01/18/2021 Arrival Information Details Patient Name: Chelsea Stanley Date of Service: 01/18/2021 11:00 AM Medical Record Number: [id number] Patient Account Number: [id number] Date of Birth/Sex: [birth year] (55 y.o. F) Treating RN: Chelsea Stanley Primary Care Chelsea Stanley: Chelsea Stanley Other Clinician: Referring Chelsea Stanley: Chelsea Stanley Treating Chelsea Stanley/Extender: Chelsea Stanley in Treatment: 6 Visit Information History Since Last Visit Pain Present Now: Yes Patient Arrived: Ambulatory Arrival Time: 11:05 Accompanied By: self Transfer Assistance: None Patient Identification Verified: Yes Secondary Verification Process Completed: Yes Patient Requires Transmission-Based Precautions: No Patient Has Alerts: No Electronic Signature(s) Signed: 01/18/2021 2:56:16 PM By: Chelsea Stanley, [name], RN, Chelsea Stanley, [name], Chelsea Stanley Entered By: Chelsea Stanley, [name], RN, Chelsea Stanley, Chelsea Stanley on 01/18/2021 11:06:01 Chelsea Stanley ([id number]) -------------------------------------------------------------------------------- Clinic Level of Care Assessment Details Patient Name: Chelsea Stanley Date of Service: 01/18/2021 11:00 AM Medical Record Number: [id number] Patient Account Number: [id number] Date of Birth/Sex: 1966-04-14 (54 y.o. F) Treating RN: Chelsea Stanley Primary Care Chelsea Stanley: Chelsea Stanley Other Clinician: Referring Chelsea Stanley: Chelsea Stanley Treating Chelsea Stanley/Extender: Chelsea Stanley in Treatment: 6 Clinic Level of Care Assessment Items TOOL 4 Quantity Score X - Use when only an EandM is performed on FOLLOW-UP visit 1 0 ASSESSMENTS - Nursing Assessment / Reassessment X - Reassessment of Co-morbidities (includes updates in patient status) 1 10 X- 1 5 Reassessment of Adherence to Treatment Plan ASSESSMENTS - Wound and Skin Assessment / Reassessment X - Simple Wound Assessment / Reassessment - one wound 1 5 []  - 0 Complex Wound Assessment / Reassessment - multiple wounds []  -  0 Dermatologic / Skin Assessment (not related to wound area) ASSESSMENTS - Focused Assessment []  - Circumferential Edema Measurements - multi extremities 0 []  - 0 Nutritional Assessment / Counseling / Intervention []  - 0 Lower Extremity Assessment (monofilament, tuning fork, pulses) []  - 0 Peripheral Arterial Disease Assessment (using hand held doppler) ASSESSMENTS - Ostomy and/or Continence Assessment and Care []  - Incontinence Assessment and Management 0 []  - 0 Ostomy Care Assessment and Management (repouching, etc.) PROCESS - Coordination of Care X - Simple Patient / Family Education for ongoing care 1 15 []  - 0 Complex (extensive) Patient / Family Education for ongoing care []  - 0 Staff obtains , Records, Test Results / Process Orders []  - 0 Staff telephones HHA, Nursing Homes / Clarify orders / etc []  - 0 Routine Transfer to another Facility (non-emergent condition) []  - 0 Routine Hospital Admission (non-emergent condition) []  - 0 New Admissions / / Ordering NPWT, Apligraf, etc. []  - 0 Emergency Hospital Admission (emergent condition) X- 1 10 Simple Discharge Coordination []  - 0 Complex (extensive) Discharge Coordination PROCESS - Special Needs []  - Pediatric / Minor Patient Management 0 []  - 0 Isolation Patient Management []  - 0 Hearing / Language / Visual special needs []  - 0 Assessment of Community assistance (transportation, D/C planning, etc.) []  - 0 Additional assistance / Altered mentation []  - 0 Support Surface(s) Assessment (bed, cushion, seat, etc.) INTERVENTIONS - Wound Cleansing / Measurement Chelsea Stanley ( ) X- 1 5 Simple Wound Cleansing - one wound []  - 0 Complex Wound Cleansing - multiple wounds X- 1 5 Wound Imaging (photographs - any number of wounds) []  - 0 Wound Tracing (instead of photographs) X- 1 5 Simple Wound Measurement - one wound []  - 0 Complex Wound Measurement - multiple  wounds INTERVENTIONS - Wound Dressings []  - Small Wound Dressing one or multiple wounds 0 []  - 0 Medium Wound Dressing one or multiple wounds []  - 0 Large Wound  Dressing one or multiple wounds []  - 0 Application of Medications - topical []  - 0 Application of Medications - injection INTERVENTIONS - Miscellaneous []  - External ear exam 0 []  - 0 Specimen Collection (cultures, biopsies, blood, body fluids, etc.) []  - 0 Specimen(s) / Culture(s) sent or taken to Lab for analysis []  - 0 Patient Transfer (multiple staff / Nurse, adultHoyer Lift / Similar devices) []  - 0 Simple Staple / Suture removal (25 or less) []  - 0 Complex Staple / Suture removal (26 or more) []  - 0 Hypo / Hyperglycemic Management (close monitor of Blood Glucose) []  - 0 Ankle / Brachial Index (ABI) - do not check if billed separately X- 1 5 Vital Signs Has the patient been seen at the hospital within the last three years: Yes Total Score: 65 Level Of Care: New/Established - Level 2 Electronic Signature(s) Signed: 01/18/2021 2:56:16 PM By: Chelsea GurneyWoody, [name], RN, Chelsea Stanley, [name], Chelsea Stanley Entered By: Chelsea GurneyWoody, [name], RN, Chelsea Stanley, Chelsea Stanley on 01/18/2021 11:11:36 Chelsea Stanley, Chelsea Stanley ([id number]) -------------------------------------------------------------------------------- Encounter Discharge Information Details Patient Name: Chelsea Stanley, Chelsea Stanley Date of Service: 01/18/2021 11:00 AM Medical Record Number: [id number] Patient Account Number: [id number] Date of Birth/Sex: 1965/11/20 (54 y.o. F) Treating RN: Chelsea Stanley, Chelsea Stanley Primary Care Marnesha Gagen: Chelsea BuffyFARRUG, Stanley Other Clinician: Referring Jaz Laningham: Chelsea BuffyFARRUG, Stanley Treating Ilario Dhaliwal/Extender: Chelsea BlaseStone, Hoyt Stanley in Treatment: 6 Encounter Discharge Information Items Discharge Condition: Stable Ambulatory Status: Ambulatory Discharge Destination: Home Transportation: Private Auto Accompanied By: self Schedule Follow-up Appointment: Yes Clinical Summary of Care: Patient Declined Electronic Signature(s) Signed:  01/23/2021 12:09:52 PM By: Chelsea Stanley, [name] RN Entered By: Chelsea Stanley, Chelsea Stanley on 01/18/2021 11:30:07 Chelsea Stanley, Chelsea Stanley ([id number]) -------------------------------------------------------------------------------- Lower Extremity Assessment Details Patient Name: Chelsea Stanley, Chelsea Stanley Date of Service: 01/18/2021 11:00 AM Medical Record Number: [id number] Patient Account Number: [id number] Date of Birth/Sex: 1965/11/20 (54 y.o. F) Treating RN: Chelsea CoventryWoody, Chelsea Stanley Primary Care Brandyn Thien: Chelsea BuffyFARRUG, Stanley Other Clinician: Referring Noor Vidales: Chelsea BuffyFARRUG, Stanley Treating Hermon Zea/Extender: Chelsea BlaseStone, Hoyt Stanley in Treatment: 6 Electronic Signature(s) Signed: 01/18/2021 2:56:16 PM By: Chelsea GurneyWoody, [name], RN, Chelsea Stanley, [name], Chelsea Stanley Entered By: Chelsea GurneyWoody, [name], RN, Chelsea Stanley, Chelsea Stanley on 01/18/2021 11:07:49 Chelsea Stanley, Chelsea Stanley ([id number]) -------------------------------------------------------------------------------- Multi Wound Chart Details Patient Name: Chelsea Stanley, Chelsea Stanley Date of Service: 01/18/2021 11:00 AM Medical Record Number: [id number] Patient Account Number: [id number] Date of Birth/Sex: 1965/11/20 (54 y.o. F) Treating RN: Chelsea CoventryWoody, Chelsea Stanley Primary Care Genevie Elman: Chelsea BuffyFARRUG, Stanley Other Clinician: Referring Dandy Lazaro: Chelsea BuffyFARRUG, Stanley Treating Arina Torry/Extender: Chelsea BlaseStone, Hoyt Stanley in Treatment: 6 Vital Signs Height(in): 65 Pulse(bpm): 67 Weight(lbs): 115 Blood Pressure(mmHg): 104/55 Body Mass Index(BMI): 19 Temperature(F): Respiratory Rate(breaths/min): 18 Photos: [N/A:N/A] Wound Location: Right Forearm N/A N/A Wounding Event: Gradually Appeared N/A N/A Primary Etiology: Abscess N/A N/A Comorbid History: Myocardial Infarction, Seizure N/A N/A Disorder Date Acquired: 07/07/2019 N/A N/A Stanley of Treatment: 6 N/A N/A Wound Status: Healed - Epithelialized N/A N/A Clustered Wound: Yes N/A N/A Clustered Quantity: 3 N/A N/A Measurements L x W x D (cm) 0x0x0 N/A N/A Area (cm) : 0 N/A N/A Volume (cm) : 0 N/A N/A % Reduction in Area: 100.00% N/A N/A % Reduction  in Volume: 100.00% N/A N/A Classification: Full Thickness Without Exposed N/A N/A Support Structures Exudate Amount: None Present N/A N/A Wound Margin: Flat and Intact N/A N/A Granulation Amount: None Present (0%) N/A N/A Necrotic Amount: None Present (0%) N/A N/A Exposed Structures: Fascia: No N/A N/A Fat Layer (Subcutaneous Tissue): No Tendon: No Muscle: No Joint: No Bone: No Epithelialization: Large (67-100%) N/A N/A Treatment Notes Electronic Signature(s) Signed: 01/18/2021 2:56:16 PM By: Chelsea GurneyWoody, [name], RN, Chelsea Stanley, [name], Chelsea Stanley Entered By: Chelsea GurneyWoody, [name], RN,  Chelsea Stanley, Chelsea Stanley on 01/18/2021 11:10:33 Chelsea Stanley, Chelsea Stanley ([id number]) -------------------------------------------------------------------------------- Multi-Disciplinary Care Plan Details Patient Name: Chelsea Stanley, Chelsea Stanley Date of Service: 01/18/2021 11:00 AM Medical Record Number: [id number] Patient Account Number: [id number] Date of Birth/Sex: 1966/03/14 (55 y.o. F) Treating RN: Chelsea Stanley Primary Care Fawna Cranmer: Chelsea Stanley Other Clinician: Referring Atsushi Yom: Chelsea Stanley Treating Omni Dunsworth/Extender: Chelsea Stanley in Treatment: 6 Active Inactive Electronic Signature(s) Signed: 01/18/2021 2:56:16 PM By: Chelsea Stanley, [name], RN, Chelsea Stanley, [name], Chelsea Stanley Entered By: Chelsea Stanley, [name], RN, Chelsea Stanley, Chelsea Stanley on 01/18/2021 11:07:59 Chelsea Stanley ([id number]) -------------------------------------------------------------------------------- Pain Assessment Details Patient Name: Chelsea Stanley Date of Service: 01/18/2021 11:00 AM Medical Record Number: [id number] Patient Account Number: [id number] Date of Birth/Sex: 1966/10/09 (54 y.o. F) Treating RN: Chelsea Stanley Primary Care Thurston Brendlinger: Chelsea Stanley Other Clinician: Referring Jamilah Jean: Chelsea Stanley Treating Barnard Sharps/Extender: Chelsea Stanley in Treatment: 6 Active Problems Location of Pain Severity and Description of Pain Patient Has Paino No Site Locations Pain Management and Medication Current Pain  Management: Electronic Signature(s) Signed: 01/18/2021 2:56:16 PM By: Chelsea Stanley, [name], RN, Chelsea Stanley, [name], Chelsea Stanley Entered By: Chelsea Stanley, [name], RN, Chelsea Stanley, Chelsea Stanley on 01/18/2021 11:06:51 Chelsea Stanley ([id number]) -------------------------------------------------------------------------------- Patient/Caregiver Education Details Patient Name: Chelsea Stanley Date of Service: 01/18/2021 11:00 AM Medical Record Number: [id number] Patient Account Number: [id number] Date of Birth/Gender: [birth year] (54 y.o. F) Treating RN: Chelsea Stanley Primary Care Physician: Chelsea Stanley Other Clinician: Referring Physician: Flossie Stanley Treating Physician/Extender: Chelsea Stanley in Treatment: 6 Education Assessment Education Provided To: Patient Education Topics Provided Wound/Skin Impairment: Methods: Explain/Verbal Responses: State content correctly Electronic Signature(s) Signed: 01/18/2021 2:56:16 PM By: Chelsea Stanley, [name], RN, Chelsea Stanley, [name], Chelsea Stanley Entered By: Chelsea Stanley, [name], RN, Chelsea Stanley, Chelsea Stanley on 01/18/2021 11:11:54 Chelsea Stanley ([id number]) -------------------------------------------------------------------------------- Wound Assessment Details Patient Name: Chelsea Stanley Date of Service: 01/18/2021 11:00 AM Medical Record Number: [id number] Patient Account Number: [id number] Date of Birth/Sex: Dec 02, 1965 (54 y.o. F) Treating RN: Chelsea Stanley Primary Care Lean Jaeger: Chelsea Stanley Other Clinician: Referring Erline Siddoway: Chelsea Stanley Treating Sumiko Ceasar/Extender: Chelsea Stanley in Treatment: 6 Wound Status Wound Number: 1 Primary Etiology: Abscess Wound Location: Right Forearm Wound Status: Healed - Epithelialized Wounding Event: Gradually Appeared Comorbid History: Myocardial Infarction, Seizure Disorder Date Acquired: 07/07/2019 Stanley Of Treatment: 6 Clustered Wound: Yes Photos Wound Measurements Length: (cm) 0 Width: (cm) 0 Depth: (cm) 0 Clustered Quantity: 3 Area: (cm) Volume: (cm) % Reduction in Area: 100% %  Reduction in Volume: 100% Epithelialization: Large (67-100%) 0 0 Wound Description Classification: Full Thickness Without Exposed Support Structures Wound Margin: Flat and Intact Exudate Amount: None Present Foul Odor After Cleansing: No Slough/Fibrino No Wound Bed Granulation Amount: None Present (0%) Exposed Structure Necrotic Amount: None Present (0%) Fascia Exposed: No Fat Layer (Subcutaneous Tissue) Exposed: No Tendon Exposed: No Muscle Exposed: No Joint Exposed: No Bone Exposed: No Electronic Signature(s) Signed: 01/18/2021 2:56:16 PM By: Chelsea Stanley, [name], RN, Chelsea Stanley, [name], Chelsea Stanley Entered By: Chelsea Stanley, [name], RN, Chelsea Stanley, Chelsea Stanley on 01/18/2021 11:07:37 Chelsea Stanley ([id number]) -------------------------------------------------------------------------------- Vitals Details Patient Name: Chelsea Stanley Date of Service: 01/18/2021 11:00 AM Medical Record Number: [id number] Patient Account Number: [id number] Date of Birth/Sex: [birth year] (54 y.o. F) Treating RN: Chelsea Stanley Primary Care Symantha Steeber: Chelsea Stanley Other Clinician: Referring Stephan Draughn: Chelsea Stanley Treating Aamir Mclinden/Extender: Chelsea Stanley in Treatment: 6 Vital Signs Time Taken: 11:06 Pulse (bpm): 67 Height (in): 65 Respiratory Rate (breaths/min): 18 Weight (lbs): 115 Blood Pressure (mmHg): 104/55 Body Mass Index (BMI): 19.1 Reference Range: 80 - 120 mg / dl Electronic Signature(s) Signed: 01/18/2021 2:56:16 PM By: Chelsea Stanley, [name], RN, Chelsea Stanley, [name], Chelsea Stanley  Entered By: Chelsea Stanley, [name], RN, Chelsea Stanley, Chelsea Stanley on 01/18/2021 11:06:36

## 2021-01-18 NOTE — Progress Notes (Addendum)
TRISA, CRANOR ([id number]) Visit Report for 01/18/2021 Chief Complaint Document Details Patient Name: Chelsea Stanley, Chelsea Stanley Date of Service: 01/18/2021 11:00 AM Medical Record Number: [id number] Patient Account Number: [id number] Date of Birth/Sex: 18-Mar-1966 (55 y.o. F) Treating RN: Yevonne Pax Primary Care Provider: [name] Other Clinician: Referring Provider: [name] Treating Provider/Extender: [name] in Treatment: 6 Information Obtained from: Patient Chief Complaint Left forearm ulcer Electronic Signature(s) Signed: 01/18/2021 11:01:26 AM By: Lenda Kelp PA-C Entered By: Lenda Kelp on 01/18/2021 11:01:25 Chelsea Stanley ([id number]) -------------------------------------------------------------------------------- HPI Details Patient Name: Chelsea Stanley Date of Service: 01/18/2021 11:00 AM Medical Record Number: [id number] Patient Account Number: [id number] Date of Birth/Sex: 1966-05-02 (54 y.o. F) Treating RN: Yevonne Pax Primary Care Provider: [name] Other Clinician: Referring Provider: [name] Treating Provider/Extender: [name] in Treatment: 6 History of Present Illness HPI Description: 12/06/2020 upon evaluation today patient appears to be doing somewhat poorly in regard to her left forearm ulcer. She is extremely anxious about the wound and the amount of pain that she is in with that. She tells me that this is a wound that she has had for about 2 years since 2020. She tells me that this was at an site of an IV and then subsequently it what sounds well like infiltrated and she has had the issue since. She has used multiple things topically including Medihoney along with other creams. She has been trying as much as she can to do everything that she has been told to treat this and trying things on her own unfortunately she just has not improved significantly. Nonetheless she does state that she really wants this healed and  is willing to do what needs to be in that case. 12/14/2020 upon evaluation today patient appears to be doing excellent in regard to her wound. I feel like the hypergranulation is dramatically improved as compared to last time. She overall seems to be making great progress in my opinion. I do not see any evidence of active infection at this time which is great news and overall the coloring around the wound looks dramatically improved today. 12/28/2020 upon evaluation today patient appears to be doing well with regard to her wounds. In fact these are measuring significantly smaller on her forearm and overall I think that she is making great progress. She tells me that it feels a lot better and she is extremely pleased. Fortunately there is no sign of active infection at this time. No fevers, chills, nausea, vomiting, or diarrhea. 01/18/2021 upon evaluation today patient appears to be doing well with regard to her wound. In fact this appears to be completely healed which is great news. There is no evidence of active infection at this time. Electronic Signature(s) Signed: 01/18/2021 11:26:05 AM By: Lenda Kelp PA-C Entered By: Lenda Kelp on 01/18/2021 11:26:05 Chelsea Stanley ([id number]) -------------------------------------------------------------------------------- Physical Exam Details Patient Name: Chelsea Stanley Date of Service: 01/18/2021 11:00 AM Medical Record Number: [id number] Patient Account Number: [id number] Date of Birth/Sex: Apr 27, 1966 (54 y.o. F) Treating RN: Yevonne Pax Primary Care Provider: [name] Other Clinician: Referring Provider: [name] Treating Provider/Extender: [name] in Treatment: 6 Constitutional Well-nourished and well-hydrated in no acute distress. Respiratory normal breathing without difficulty. Psychiatric this patient is able to make decisions and demonstrates good insight into disease process. Alert and Oriented x 3. pleasant  and cooperative. Notes Patient's wound bed showed signs of complete epithelization. There does not appear to be any evidence of active infection which is  great news and overall very pleased with where things stand. No fevers, chills, nausea, vomiting, or diarrhea. Electronic Signature(s) Signed: 01/18/2021 11:26:20 AM By: Lenda Kelp PA-C Entered By: Lenda Kelp on 01/18/2021 11:26:20 Chelsea Stanley ([id number]) -------------------------------------------------------------------------------- Physician Orders Details Patient Name: Chelsea Stanley Date of Service: 01/18/2021 11:00 AM Medical Record Number: [id number] Patient Account Number: [id number] Date of Birth/Sex: 1966-08-20 (54 y.o. F) Treating RN: [name] Primary Care Provider: [name] Other Clinician: Referring Provider: [name] Treating Provider/Extender: [name] in Treatment: 6 Verbal / Phone Orders: No Diagnosis Coding ICD-10 Coding Code Description L02.414 Cutaneous abscess of left upper limb L98.492 Non-pressure chronic ulcer of skin of other sites with fat layer exposed F41.1 Generalized anxiety disorder Discharge From [facility] Services o Discharge from Wound Care Center Treatment Complete Electronic Signature(s) Signed: 01/18/2021 11:30:15 AM By: Lenda Kelp PA-C Signed: 01/18/2021 2:56:16 PM By: [name], [name], RN, [name], [name], [name] Entered By: [name], [name], RN, [name], Kim on 01/18/2021 11:11:05 Chelsea Stanley ([id number]) -------------------------------------------------------------------------------- Problem List Details Patient Name: Chelsea Stanley Date of Service: 01/18/2021 11:00 AM Medical Record Number: [id number] Patient Account Number: [id number] Date of Birth/Sex: 05-31-1966 (54 y.o. F) Treating RN: Yevonne Pax Primary Care Provider: [name] Other Clinician: Referring Provider: [name] Treating Provider/Extender: [name] in Treatment: 6 Active  Problems ICD-10 Encounter Code Description Active Date MDM Diagnosis L02.414 Cutaneous abscess of left upper limb 12/06/2020 No Yes L98.492 Non-pressure chronic ulcer of skin of other sites with fat layer exposed 12/06/2020 No Yes F41.1 Generalized anxiety disorder 12/06/2020 No Yes Inactive Problems Resolved Problems Electronic Signature(s) Signed: 01/18/2021 11:01:20 AM By: Lenda Kelp PA-C Entered By: Lenda Kelp on 01/18/2021 11:01:19 Chelsea Stanley ([id number]) -------------------------------------------------------------------------------- Progress Note Details Patient Name: Chelsea Stanley Date of Service: 01/18/2021 11:00 AM Medical Record Number: [id number] Patient Account Number: [id number] Date of Birth/Sex: [birth year] (54 y.o. F) Treating RN: Yevonne Pax Primary Care Provider: [name] Other Clinician: Referring Provider: [name] Treating Provider/Extender: [name] in Treatment: 6 Subjective Chief Complaint Information obtained from Patient Left forearm ulcer History of Present Illness (HPI) 12/06/2020 upon evaluation today patient appears to be doing somewhat poorly in regard to her left forearm ulcer. She is extremely anxious about the wound and the amount of pain that she is in with that. She tells me that this is a wound that she has had for about 2 years since 2020. She tells me that this was at an site of an IV and then subsequently it what sounds well like infiltrated and she has had the issue since. She has used multiple things topically including Medihoney along with other creams. She has been trying as much as she can to do everything that she has been told to treat this and trying things on her own unfortunately she just has not improved significantly. Nonetheless she does state that she really wants this healed and is willing to do what needs to be in that case. 12/14/2020 upon evaluation today patient appears to be doing excellent  in regard to her wound. I feel like the hypergranulation is dramatically improved as compared to last time. She overall seems to be making great progress in my opinion. I do not see any evidence of active infection at this time which is great news and overall the coloring around the wound looks dramatically improved today. 12/28/2020 upon evaluation today patient appears to be doing well with regard to her wounds. In fact these are measuring significantly  smaller on her forearm and overall I think that she is making great progress. She tells me that it feels a lot better and she is extremely pleased. Fortunately there is no sign of active infection at this time. No fevers, chills, nausea, vomiting, or diarrhea. 01/18/2021 upon evaluation today patient appears to be doing well with regard to her wound. In fact this appears to be completely healed which is great news. There is no evidence of active infection at this time. Objective Constitutional Well-nourished and well-hydrated in no acute distress. Vitals Time Taken: 11:06 AM, Height: 65 in, Weight: 115 lbs, BMI: 19.1, Pulse: 67 bpm, Respiratory Rate: 18 breaths/min, Blood Pressure: 104/55 mmHg. Respiratory normal breathing without difficulty. Psychiatric this patient is able to make decisions and demonstrates good insight into disease process. Alert and Oriented x 3. pleasant and cooperative. General Notes: Patient's wound bed showed signs of complete epithelization. There does not appear to be any evidence of active infection which is great news and overall very pleased with where things stand. No fevers, chills, nausea, vomiting, or diarrhea. Integumentary (Hair, Skin) Wound #1 status is Healed - Epithelialized. Original cause of wound was Gradually Appeared. The date acquired was: 07/07/2019. The wound has been in treatment 6 weeks. The wound is located on the Right Forearm. The wound measures 0cm length x 0cm width x 0cm depth; 0cm^2 area and  0cm^3 volume. There is a none present amount of drainage noted. The wound margin is flat and intact. There is no granulation within the wound bed. There is no necrotic tissue within the wound bed. Assessment Chelsea Stanley, Chelsea Stanley ([id number]) Active Problems ICD-10 Cutaneous abscess of left upper limb Non-pressure chronic ulcer of skin of other sites with fat layer exposed Generalized anxiety disorder Plan Discharge From [facility] Services: Discharge from Wound Care Center Treatment Complete 1. At this time I think she can continue to use the collagen cream to help with scarring that she purchased I think there is no problems with that and overall seems to be doing quite well. 2. The wound itself is completely healed and seems to be doing excellent. She will continue to monitor for any issues but I do not expect anything at this point. We will see the patient back for follow-up visit as needed. Electronic Signature(s) Signed: 01/18/2021 11:27:00 AM By: Lenda Kelp PA-C Entered By: Lenda Kelp on 01/18/2021 11:27:00 Chelsea Stanley ([id number]) -------------------------------------------------------------------------------- SuperBill Details Patient Name: Chelsea Stanley Date of Service: 01/18/2021 Medical Record Number: [id number] Patient Account Number: [id number] Date of Birth/Sex: 07-Sep-1966 (54 y.o. F) Treating RN: [name] Primary Care Provider: [name] Other Clinician: Referring Provider: [name] Treating Provider/Extender: [name] in Treatment: 6 Diagnosis Coding ICD-10 Codes Code Description L02.414 Cutaneous abscess of left upper limb L98.492 Non-pressure chronic ulcer of skin of other sites with fat layer exposed F41.1 Generalized anxiety disorder Facility Procedures CPT4 Code: 08657846 Description: [id number] - WOUND CARE VISIT-LEV 2 EST PT Modifier: Quantity: 1 Physician Procedures CPT4 Code: 2841324 Description: 99213 - WC PHYS LEVEL 3 - EST  PT Modifier: Quantity: 1 CPT4 Code: Description: ICD-10 Diagnosis Description L02.414 Cutaneous abscess of left upper limb L98.492 Non-pressure chronic ulcer of skin of other sites with fat layer expos F41.1 Generalized anxiety disorder Modifier: ed Quantity: Electronic Signature(s) Signed: 01/18/2021 11:27:15 AM By: Lenda Kelp PA-C Entered By: Lenda Kelp on 01/18/2021 11:27:13

## 2021-04-03 DIAGNOSIS — F4000 Agoraphobia, unspecified: Secondary | ICD-10-CM | POA: Insufficient documentation

## 2021-08-30 ENCOUNTER — Emergency Department
Admission: EM | Admit: 2021-08-30 | Discharge: 2021-08-30 | Disposition: A | Discharge status: Home / Self Care | Payer: BC Managed Care – PPO | Attending: Emergency Medicine | Admitting: Emergency Medicine

## 2021-08-30 DIAGNOSIS — R569 Unspecified convulsions: Secondary | ICD-10-CM | POA: Diagnosis present

## 2021-08-30 DIAGNOSIS — Z91010 Allergy to peanuts: Secondary | ICD-10-CM | POA: Insufficient documentation

## 2021-08-30 DIAGNOSIS — Z91040 Latex allergy status: Secondary | ICD-10-CM | POA: Insufficient documentation

## 2021-08-30 DIAGNOSIS — Z79899 Other long term (current) drug therapy: Secondary | ICD-10-CM | POA: Diagnosis not present

## 2021-08-30 NOTE — ED Notes (Signed)
Unable to assess patient suicide and voilence risk as patient is not responding to questions at this time.

## 2021-08-30 NOTE — ED Provider Notes (Signed)
[facility] Emergency Department Provider Note   ____________________________________________    I have reviewed the triage vital signs and the nursing notes.   HISTORY  Chief Complaint Seizures     HPI Chelsea Stanley is a 55 y.o. female brought in by EMS from a nearby store after seizure-like activity.  Patient is demonstrating anxiety here in the department and is not providing significant history.  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   Past Surgical History:  Procedure Laterality Date   BREAST SURGERY      Prior to Admission medications   Medication Sig Start Date End Date Taking? Authorizing Provider  Butalbital-APAP-Caffeine 50-325-40 MG capsule Take 1 capsule by mouth every 4 (four) hours as needed.    [provider]  clonazePAM (KLONOPIN) 0.5 MG tablet Take 3 tablets by mouth at bedtime.    [provider]  doxycycline (VIBRA-TABS) 100 MG tablet Take 1 tablet (100 mg total) by mouth 2 (two) times daily. 09/13/20   Menshew, Charlesetta Ivory, PA-C  hydrOXYzine (ATARAX/VISTARIL) 50 MG tablet Take 50 mg by mouth daily as needed. 09/22/16   [provider]  Melatonin 5 MG TABS Take 5 mg by mouth at bedtime as needed.    [provider]  mirtazapine (REMERON) 15 MG tablet Take 15 mg by mouth at bedtime.    [provider]  oxymetazoline (AFRIN) 0.05 % nasal spray Place 1 spray into both nostrils 2 (two) times daily as needed for congestion.    [provider]  polyethylene glycol (MIRALAX / GLYCOLAX) packet Take 17 g by mouth every other day.    [provider]  promethazine (PHENERGAN) 12.5 MG tablet Take 12.5 mg  by mouth every 6 (six) hours as needed for nausea or vomiting.    [provider]  sertraline (ZOLOFT) 50 MG tablet Take 50 mg by mouth at bedtime.    [provider]  topiramate (TOPAMAX) 25 MG tablet Take 75 mg by mouth daily.    [provider]  traZODone (DESYREL) 100 MG tablet Take 100 mg by mouth daily. 09/22/16   [provider]     Allergies Penicillins, Strawberry (diagnostic), Peanut-containing drug products, Latex, Sulfa antibiotics, and Vancomycin  No family history on file.  Social History Social History   Tobacco Use   Smoking status: Never   Smokeless tobacco: Never  Substance Use Topics   Alcohol use: No    Review of Systems  Constitutional: No fever/chills Eyes: No visual changes.  ENT: No sore throat. Cardiovascular: Denies chest pain. Respiratory: Denies shortness of breath. Gastrointestinal: No abdominal pain.  No nausea, no vomiting.   Genitourinary: Negative for dysuria. Musculoskeletal: Mild low back Skin: Negative for rash. Neurological: Negative for headaches    ____________________________________________   PHYSICAL EXAM:  VITAL SIGNS: ED Triage Vitals  Enc Vitals Group     BP 08/30/21 1210 99/66     Pulse Rate 08/30/21 1210 64     Resp 08/30/21 1210 (!) 22     Temp 08/30/21 1212 98.5 F (36.9 C)     Temp src --      SpO2 08/30/21 1201 100 %     Weight --  Height --      Head Circumference --      Peak Flow --      Pain Score 08/30/21 1212 9     Pain Loc --      Pain Edu? --      Excl. in GC? --     Constitutional: Alert and oriented. Eyes: Conjunctivae are normal.  Head: Atraumatic. Nose: No epistaxis Mouth/Throat: Mucous membranes are moist.  No tongue laceration Neck:  Painless ROM Cardiovascular: Normal rate, regular rhythm. Grossly normal heart sounds.  Good peripheral circulation. Respiratory: Normal respiratory effort.  No retractions. Lungs CTAB. Gastrointestinal: Soft and  nontender. No distention.  Genitourinary: deferred Musculoskeletal:  Warm and well perfused Neurologic:  Normal speech and language. No gross focal neurologic deficits are appreciated.  Skin:  Skin is warm, dry and intact. No rash noted. Psychiatric: Highly anxious, "please do not be mad at me "  ____________________________________________   LABS (all labs ordered are listed, but only abnormal results are displayed)  Labs Reviewed - No data to display ____________________________________________  EKG ED ECG REPORT I, Jene Every, the attending physician, personally viewed and interpreted this ECG.  Date: 08/30/2021  Rhythm: normal sinus rhythm QRS Axis: normal Intervals: normal ST/T Wave abnormalities: normal Narrative Interpretation: no evidence of acute ischemia  ____________________________________________  RADIOLOGY   ____________________________________________   PROCEDURES  Procedure(s) performed: No  Procedures   Critical Care performed: No ____________________________________________   INITIAL IMPRESSION / ASSESSMENT AND PLAN / ED COURSE  Pertinent labs & imaging results that were available during my care of the patient were reviewed by me and considered in my medical decision making (see chart for details).   Patient presents after seizure-like activity at store, no postictal periods, alert but very anxious here.    After review of records I suspect this is her baseline.    Recommended x-ray of her lumbar spine because she was complaining of pain there however she said "nothing is broken "and would like to be released  Convinced patient to be observed in the emergency department for a little longer to ensure no further seizure-like activity.      Patient significantly proved him back at her baseline, eating a [place] sandwich, quite comfortable appropriate for discharge      ____________________________________________   FINAL CLINICAL  IMPRESSION(S) / ED DIAGNOSES  Final diagnoses:  Seizure-like activity (HCC)        Note:  This document was prepared using Dragon voice recognition software and may include unintentional dictation errors.    Jene Every, MD 08/30/21 [phone]

## 2021-08-30 NOTE — ED Notes (Addendum)
Pt asking to go home. Md aware bf chris called for pt transport. Left voice message for pt contact

## 2021-08-30 NOTE — ED Notes (Signed)
Pt co L back pain and R knee pain upon md assessment.

## 2021-08-30 NOTE — ED Triage Notes (Signed)
Per EMS pt was shopping in [place] when she had witnesses seizure. Pt was combative and repeating words with EMS, pt absently looking off. Upon arrival pt combative and attempting to leave screaming "home home home home" Pt than appeared to syncopize. PT then appears unresponsive but moving all 4 ext spontaneously.

## 2021-08-30 NOTE — ED Notes (Signed)
PT contact enroute to pick patient up. PT asking to be taken to [facility].

## 2021-11-25 ENCOUNTER — Emergency Department: Payer: BC Managed Care – PPO

## 2021-11-25 ENCOUNTER — Other Ambulatory Visit: Payer: Self-pay

## 2021-11-25 ENCOUNTER — Observation Stay
Admission: EM | Admit: 2021-11-25 | Discharge: 2021-11-28 | Disposition: A | Discharge status: Home / Self Care | Payer: BC Managed Care – PPO | Attending: Family Medicine | Admitting: Family Medicine

## 2021-11-25 DIAGNOSIS — I252 Old myocardial infarction: Secondary | ICD-10-CM | POA: Diagnosis not present

## 2021-11-25 DIAGNOSIS — M726 Necrotizing fasciitis: Secondary | ICD-10-CM | POA: Insufficient documentation

## 2021-11-25 DIAGNOSIS — Z8543 Personal history of malignant neoplasm of ovary: Secondary | ICD-10-CM | POA: Diagnosis not present

## 2021-11-25 DIAGNOSIS — F4000 Agoraphobia, unspecified: Secondary | ICD-10-CM | POA: Diagnosis not present

## 2021-11-25 DIAGNOSIS — S61011A Laceration without foreign body of right thumb without damage to nail, initial encounter: Secondary | ICD-10-CM | POA: Diagnosis not present

## 2021-11-25 DIAGNOSIS — F445 Conversion disorder with seizures or convulsions: Secondary | ICD-10-CM

## 2021-11-25 DIAGNOSIS — L0390 Cellulitis, unspecified: Secondary | ICD-10-CM | POA: Diagnosis not present

## 2021-11-25 DIAGNOSIS — R6521 Severe sepsis with septic shock: Secondary | ICD-10-CM | POA: Insufficient documentation

## 2021-11-25 DIAGNOSIS — Z981 Arthrodesis status: Secondary | ICD-10-CM

## 2021-11-25 DIAGNOSIS — I69328 Other speech and language deficits following cerebral infarction: Secondary | ICD-10-CM | POA: Diagnosis not present

## 2021-11-25 DIAGNOSIS — G40909 Epilepsy, unspecified, not intractable, without status epilepticus: Secondary | ICD-10-CM | POA: Diagnosis not present

## 2021-11-25 DIAGNOSIS — F39 Unspecified mood [affective] disorder: Secondary | ICD-10-CM | POA: Diagnosis present

## 2021-11-25 DIAGNOSIS — Z79899 Other long term (current) drug therapy: Secondary | ICD-10-CM

## 2021-11-25 DIAGNOSIS — E871 Hypo-osmolality and hyponatremia: Secondary | ICD-10-CM | POA: Diagnosis not present

## 2021-11-25 DIAGNOSIS — R112 Nausea with vomiting, unspecified: Secondary | ICD-10-CM | POA: Insufficient documentation

## 2021-11-25 DIAGNOSIS — D649 Anemia, unspecified: Secondary | ICD-10-CM | POA: Diagnosis not present

## 2021-11-25 DIAGNOSIS — Z881 Allergy status to other antibiotic agents status: Secondary | ICD-10-CM

## 2021-11-25 DIAGNOSIS — Z20822 Contact with and (suspected) exposure to covid-19: Secondary | ICD-10-CM | POA: Insufficient documentation

## 2021-11-25 DIAGNOSIS — W25XXXA Contact with sharp glass, initial encounter: Secondary | ICD-10-CM | POA: Diagnosis present

## 2021-11-25 DIAGNOSIS — L03113 Cellulitis of right upper limb: Secondary | ICD-10-CM | POA: Insufficient documentation

## 2021-11-25 DIAGNOSIS — F329 Major depressive disorder, single episode, unspecified: Secondary | ICD-10-CM | POA: Insufficient documentation

## 2021-11-25 DIAGNOSIS — A419 Sepsis, unspecified organism: Secondary | ICD-10-CM | POA: Diagnosis present

## 2021-11-25 MED ORDER — CLINDAMYCIN PHOSPHATE 900 MG/50ML IV SOLN
900.0000 mg | Freq: Once | INTRAVENOUS | Status: AC
  Administered 2021-11-26: 900 mg via INTRAVENOUS
  Filled 2021-11-25: qty 50

## 2021-11-25 MED ORDER — ONDANSETRON 4 MG PO TBDP
ORAL_TABLET | ORAL | Status: AC
  Filled 2021-11-25: qty 1

## 2021-11-25 MED ORDER — SODIUM CHLORIDE 0.9 % IV SOLN
1.0000 g | Freq: Once | INTRAVENOUS | Status: AC
  Administered 2021-11-26: 1 g via INTRAVENOUS
  Filled 2021-11-25: qty 1

## 2021-11-25 MED ORDER — HYDROCODONE-ACETAMINOPHEN 5-325 MG PO TABS
1.0000 | ORAL_TABLET | Freq: Once | ORAL | Status: AC
  Administered 2021-11-25: 1 via ORAL
  Filled 2021-11-25: qty 1

## 2021-11-25 MED ORDER — ONDANSETRON 4 MG PO TBDP
4.0000 mg | ORAL_TABLET | Freq: Once | ORAL | Status: AC
  Administered 2021-11-25: 4 mg via ORAL

## 2021-11-25 MED ORDER — LACTATED RINGERS IV BOLUS
2000.0000 mL | Freq: Once | INTRAVENOUS | Status: AC
  Administered 2021-11-26: 2000 mL via INTRAVENOUS

## 2021-11-25 NOTE — ED Triage Notes (Addendum)
Pt come with c/o right arm pain. Pt states she injured it on some picture frames. Pt states she thinks she broke her thumb.

## 2021-11-25 NOTE — ED Provider Notes (Signed)
[facility] Provider Note    Event Date/Time   First MD Initiated Contact with Patient 11/25/21 2347     (approximate)   History   Arm Injury   HPI  Chelsea Stanley is a 56 y.o. female history of severe anxiety, agoraphobia, depression who presents for evaluation of right hand and wrist pain.  Patient reports 1 week ago she cut her right wrist onto a big picture frame made of glass.  2 to 3 days later she clean her fish tank without any gloves on.  Over the last few days she has noticed progressively worsening redness and swelling that started in the wrist and now has expanded to the hand and up to her forearm.  She is complaining of 10 out of 10 pain, worse with any movement of the hand, low-grade fever and chills.  Patient is not a diabetic.     Past Medical History:  Diagnosis Date   Depression     Past Surgical History:  Procedure Laterality Date   BREAST SURGERY       Physical Exam   Triage Vital Signs: ED Triage Vitals  Enc Vitals Group     BP 11/25/21 1804 (!) 87/66     Pulse Rate 11/25/21 1804 98     Resp 11/25/21 1804 18     Temp 11/25/21 1804 99.3 F (37.4 C)     Temp Source 11/25/21 1804 Oral     SpO2 11/25/21 1804 96 %     Weight --      Height --      Head Circumference --      Peak Flow --      Pain Score 11/25/21 1749 8     Pain Loc --      Pain Edu? --      Excl. in GC? --     Most recent vital signs: Vitals:   11/26/21 0330 11/26/21 0400  BP: 96/70 (!) 98/58  Pulse: 70 61  Resp: (!) 23 17  Temp:    SpO2: 98% 99%     Constitutional: Alert and oriented. Well appearing and in no apparent distress. HEENT:      Head: Normocephalic and atraumatic.         Eyes: Conjunctivae are normal. Sclera is non-icteric.       Mouth/Throat: Mucous membranes are moist.       Neck: Supple with no signs of meningismus. Cardiovascular: Regular rate and rhythm. No murmurs, gallops, or rubs. 2+ symmetrical distal pulses are  present in all extremities.  Respiratory: Normal respiratory effort. Lungs are clear to auscultation bilaterally.  Gastrointestinal: Soft, non tender. Musculoskeletal: There is a healing laceration at the base of the right thumb with overlying erythema, warmth, and swelling extending into the hand and forearm both dorsally and palmar, no crepitus or bulla, no necrosis.  Intact radial pulse and brisk capillary refill Neurologic: Normal speech and language. Face is symmetric. Moving all extremities. No gross focal neurologic deficits are appreciated. Skin: Skin is warm, dry and intact. No rash noted. Psychiatric: Mood and affect are normal. Speech and behavior are normal.  ED Results / Procedures / Treatments   Labs (all labs ordered are listed, but only abnormal results are displayed) Labs Reviewed  CBC WITH DIFFERENTIAL/PLATELET - Abnormal; Notable for the following components:      Result Value   Hemoglobin 11.6 (*)    HCT 35.9 (*)    All other components within normal limits  COMPREHENSIVE METABOLIC PANEL - Abnormal; Notable for the following components:   Sodium 134 (*)    Glucose, Bld 118 (*)    Calcium 8.5 (*)    All other components within normal limits  LACTIC ACID, PLASMA - Abnormal; Notable for the following components:   Lactic Acid, Venous 2.4 (*)    All other components within normal limits  RESP PANEL BY RT-PCR (FLU A&B, COVID) ARPGX2  CULTURE, BLOOD (ROUTINE X 2)  CULTURE, BLOOD (ROUTINE X 2)  LACTIC ACID, PLASMA     EKG  none   RADIOLOGY I have personally reviewed the images performed during this visit and interpret them as below:  XR hand: subcutaneous gas and soft tissue swelling - agree with radiologist read  CT wrist: Gas-forming infection of the wrist and forearm  ______________________________  Interpretation by Radiologist:  CT WRIST RIGHT W CONTRAST  Result Date: 11/26/2021 CLINICAL DATA:  Right hand trauma 1 week ago with increasing  lateral-sided hand and wrist swelling since then. EXAM: CT OF THE UPPER RIGHT EXTREMITY WITH CONTRAST TECHNIQUE: Multidetector CT imaging of the upper right forearm, wrist and hand was performed according to the standard protocol following intravenous contrast administration. CONTRAST:  75mL OMNIPAQUE IOHEXOL 300 MG/ML  SOLN COMPARISON:  Right hand series yesterday's the only previous. FINDINGS: Bones/Joint/Cartilage There is normal bone mineralization. The fingers are flexed limiting evaluation of the phalanges. No fractures or focal bone lesions are observed. There are no erosive lesions or focal periosteal reactions. Joint spaces are maintained. Ligaments Suboptimally assessed by CT. However, osseous structures are normally aligned. Muscles and Tendons All regional tendons are grossly intact. No intramuscular masses or fluid collections are seen. Soft tissues There is moderate superficial edema along the dorsal/dorsolateral, ventral and lateral aspect of the wrist and proximal hand, ventrally in the distal forearm. There are small scattered air pockets in the soft tissues at the lateral and ventrolateral aspect at the level of the wrist concerning for a gas-forming infection. No localized abscess is seen. IMPRESSION: 1. Soft tissue edema in the distal forearm, wrist and hand, with small scattered air pockets in the soft tissues laterally, findings worrisome for a gas-forming cellulitis. No localized fluid collection is seen in the superficial soft tissues and regional muscles, but underlying myositis is difficult to exclude on CT. Consider follow-up MRI if clinically warranted. There are no findings suspicious for acute osteomyelitis. 2. No evidence of fracture, dislocation or significant degenerative changes, with limited view of the phalanges due to flexion of the fingers. Electronically Signed   By: Almira BarKeith  Chesser M.D.   On: 11/26/2021 02:10   DG Hand Complete Right  Result Date: 11/25/2021 CLINICAL DATA:   Pain EXAM: RIGHT HAND - COMPLETE  VIEW COMPARISON:  None. FINDINGS: There is no evidence of fracture or dislocation. There is no evidence of arthropathy or other focal bone abnormality. Soft tissue swelling with associated soft tissue lucencies of the radial aspect of the wrist. IMPRESSION: 1. Soft tissue swelling with associated soft tissue gas, concerning for infection with gas-forming organism. 2. No acute osseous abnormality. Electronically Signed   By: Allegra LaiLeah  Strickland M.D.   On: 11/25/2021 18:56      PROCEDURES:  Critical Care performed: Yes, see critical care procedure note(s)  .Critical Care Performed by: Nita SickleVeronese, [place], MD Authorized by: Nita SickleVeronese, [place], MD   Critical care provider statement:    Critical care time (minutes):  40   Critical care was time spent personally by me on the following activities:  Development of treatment plan with patient or surrogate, discussions with consultants, evaluation of patient's response to treatment, examination of patient, ordering and review of laboratory studies, ordering and review of radiographic studies, ordering and performing treatments and interventions, pulse oximetry, re-evaluation of patient's condition and review of old charts   I assumed direction of critical care for this patient from another provider in my specialty: no     Care discussed with: admitting provider      IMPRESSION / MDM / [place] / ED COURSE  I reviewed the triage vital signs and the nursing notes.  56 y.o. female history of severe anxiety, agoraphobia, depression who presents for evaluation of right hand and wrist pain.  Patient sustained a laceration 2 weeks ago and now presents with 1 week of progressively worsening swelling, erythema and pain.  Patient does report cleaning her fish tank 3 days after she sustained a laceration.  She is not a diabetic.  Ddx: Necrotizing infection versus cellulitis versus septic arthritis versus deep  infection/abscess   Plan: CBC, chemistry panel, lactic acidosis, blood cultures, CT with IV contrast.  Patient placed on telemetry for close monitoring of cardiorespiratory status and hemodynamic status.  Will initiate broad-spectrum antibiotics with IV cefepime and clindamycin.  Unfortunately patient is allergic to vancomycin.  We will initiate IV fluids for hypotension. Tetanus is up to date   [facility] ED: Medications  lactated ringers infusion ( Intravenous New Bag/Given 11/26/21 0151)  meropenem (MERREM) 1 g in sodium chloride 0.9 % 100 mL IVPB (1 g Intravenous New Bag/Given 11/26/21 0417)  HYDROcodone-acetaminophen (NORCO/VICODIN) 5-325 MG per tablet 1 tablet (1 tablet Oral Given 11/25/21 1810)  ondansetron (ZOFRAN-ODT) disintegrating tablet 4 mg (4 mg Oral Given 11/25/21 1810)  ondansetron (ZOFRAN-ODT) 4 MG disintegrating tablet (  Given by Other 11/25/21 1815)  lactated ringers bolus 2,000 mL (0 mLs Intravenous Stopped 11/26/21 0338)  ceFEPIme (MAXIPIME) 1 g in sodium chloride 0.9 % 100 mL IVPB (0 g Intravenous Stopped 11/26/21 0151)  clindamycin (CLEOCIN) IVPB 900 mg (0 mg Intravenous Stopped 11/26/21 0247)  iohexol (OMNIPAQUE) 300 MG/ML solution 75 mL (75 mLs Intravenous Contrast Given 11/26/21 0128)     ED COURSE: Imaging concerning for necrotizing cellulitis.  Normal white count with a lactic of 2.4 concerning for sepsis.  Blood cultures are pending.  I consulted orthopedics and had a discussion about need of emergent OR washout with Dr. Mack Guise.  After reviewing the case and imaging he felt the patient needed to go to a tertiary care hospital where a hand specialist available.  Patient requested to go to [facility].  We have contacted the transfer center and were waiting for a call back.    _________________________ 2:38 AM on 11/26/2021 ----------------------------------------- Spoke with Dr. Mack Guise from Ortho who does not feel comfortable keeping patient here and feels patient  needs to go to tertiary care level hospital where a hand surgeon is available. Patient requesting [facility]   _________________________ 3:03 AM on 11/26/2021 ----------------------------------------- No beds available at [facility]. [facility] has been called   _________________________ 3:20 AM on 11/26/2021 ----------------------------------------- Could [facility] transfer center who says they have patients waiting up to 48 hours for transfer.  I reached back out to Dr. Mack Guise and explained to him the now we have a very time sensitive infection with no available near transfer option. He will come in to evaluate patient and is considering taking her to OR for surgical debridement   _________________________ 4:29 AM on 11/26/2021 ----------------------------------------- Patient  taken to the OR by Dr. Mack Guise   Consults: Orthopedics   EMR reviewed including last visit with primary care doctor from May 2022 where patient was seen for an annual checkup          FINAL CLINICAL IMPRESSION(S) / ED DIAGNOSES   Final diagnoses:  Necrotizing cellulitis  Sepsis, due to unspecified organism, unspecified whether acute organ dysfunction present [facility])     Rx / DC Orders   ED Discharge Orders     None        Note:  This document was prepared using Dragon voice recognition software and may include unintentional dictation errors.    Rudene Re, MD 11/26/21 [phone]

## 2021-11-25 NOTE — ED Provider Triage Note (Signed)
Emergency Medicine Provider Triage Evaluation Note  Ezariah Weltman , a 56 y.o. female  was evaluated in triage.  Pt complains of right hand pain after falling about a week ago and hitting it on some picture frames. She had been wrapping it and then applied a heating pad. No relief. Symptoms worsened.   Review of Systems  Positive: Right hand pain, right wrist pain Negative: Elbow pain, shoulder pain  Physical Exam  BP (!) 87/66 Comment: hx of same. Jaicob Dia beth aware   Pulse 98    Temp 99.3 F (37.4 C) (Oral)    Resp 18    SpO2 96%  Gen:   Awake, no distress   Resp:  Normal effort  MSK:   Moves extremities without difficulty  Other:    Medical Decision Making  Medically screening exam initiated at 6:39 PM.  Appropriate orders placed.  Mckenze Oregel was informed that the remainder of the evaluation will be completed by another provider, this initial triage assessment does not replace that evaluation, and the importance of remaining in the ED until their evaluation is complete.    Victorino Dike, FNP 11/25/21 1840

## 2021-11-25 NOTE — ED Notes (Signed)
Reviewed pt's xray & discussed with Dr Erma Heritage....acuity level changed and will take pt to next available exam room

## 2021-11-26 ENCOUNTER — Encounter
Admission: EM | Disposition: A | Discharge status: Home / Self Care | Payer: Self-pay | Source: Home / Self Care | Attending: Emergency Medicine

## 2021-11-26 ENCOUNTER — Emergency Department: Payer: BC Managed Care – PPO

## 2021-11-26 ENCOUNTER — Emergency Department: Payer: BC Managed Care – PPO | Admitting: Anesthesiology

## 2021-11-26 ENCOUNTER — Encounter: Payer: Self-pay | Admitting: Radiology

## 2021-11-26 ENCOUNTER — Other Ambulatory Visit: Payer: Self-pay

## 2021-11-26 DIAGNOSIS — F32A Depression, unspecified: Secondary | ICD-10-CM | POA: Diagnosis not present

## 2021-11-26 DIAGNOSIS — S61011A Laceration without foreign body of right thumb without damage to nail, initial encounter: Secondary | ICD-10-CM | POA: Diagnosis present

## 2021-11-26 DIAGNOSIS — Z8543 Personal history of malignant neoplasm of ovary: Secondary | ICD-10-CM | POA: Diagnosis not present

## 2021-11-26 DIAGNOSIS — F419 Anxiety disorder, unspecified: Secondary | ICD-10-CM

## 2021-11-26 DIAGNOSIS — Z981 Arthrodesis status: Secondary | ICD-10-CM | POA: Diagnosis not present

## 2021-11-26 DIAGNOSIS — R569 Unspecified convulsions: Secondary | ICD-10-CM

## 2021-11-26 DIAGNOSIS — I252 Old myocardial infarction: Secondary | ICD-10-CM | POA: Diagnosis not present

## 2021-11-26 DIAGNOSIS — Z20822 Contact with and (suspected) exposure to covid-19: Secondary | ICD-10-CM | POA: Diagnosis present

## 2021-11-26 DIAGNOSIS — L03113 Cellulitis of right upper limb: Secondary | ICD-10-CM

## 2021-11-26 DIAGNOSIS — A419 Sepsis, unspecified organism: Secondary | ICD-10-CM | POA: Diagnosis not present

## 2021-11-26 DIAGNOSIS — R112 Nausea with vomiting, unspecified: Secondary | ICD-10-CM | POA: Diagnosis not present

## 2021-11-26 DIAGNOSIS — G40909 Epilepsy, unspecified, not intractable, without status epilepticus: Secondary | ICD-10-CM

## 2021-11-26 DIAGNOSIS — Z79899 Other long term (current) drug therapy: Secondary | ICD-10-CM | POA: Diagnosis not present

## 2021-11-26 DIAGNOSIS — F329 Major depressive disorder, single episode, unspecified: Secondary | ICD-10-CM | POA: Diagnosis present

## 2021-11-26 DIAGNOSIS — R6521 Severe sepsis with septic shock: Secondary | ICD-10-CM | POA: Diagnosis present

## 2021-11-26 DIAGNOSIS — E871 Hypo-osmolality and hyponatremia: Secondary | ICD-10-CM | POA: Diagnosis present

## 2021-11-26 DIAGNOSIS — D649 Anemia, unspecified: Secondary | ICD-10-CM | POA: Diagnosis present

## 2021-11-26 DIAGNOSIS — M726 Necrotizing fasciitis: Secondary | ICD-10-CM | POA: Diagnosis present

## 2021-11-26 DIAGNOSIS — Z881 Allergy status to other antibiotic agents status: Secondary | ICD-10-CM | POA: Diagnosis not present

## 2021-11-26 DIAGNOSIS — I69328 Other speech and language deficits following cerebral infarction: Secondary | ICD-10-CM | POA: Diagnosis not present

## 2021-11-26 DIAGNOSIS — I96 Gangrene, not elsewhere classified: Secondary | ICD-10-CM | POA: Diagnosis not present

## 2021-11-26 DIAGNOSIS — F4000 Agoraphobia, unspecified: Secondary | ICD-10-CM | POA: Diagnosis present

## 2021-11-26 DIAGNOSIS — W25XXXA Contact with sharp glass, initial encounter: Secondary | ICD-10-CM | POA: Diagnosis present

## 2021-11-26 DIAGNOSIS — L0390 Cellulitis, unspecified: Secondary | ICD-10-CM | POA: Diagnosis present

## 2021-11-26 HISTORY — PX: INCISION AND DRAINAGE ABSCESS: SHX5864

## 2021-11-26 LAB — CBC WITH DIFFERENTIAL/PLATELET
Abs Immature Granulocytes: 0.01 10*3/uL (ref 0.00–0.07)
Basophils Absolute: 0 10*3/uL (ref 0.0–0.1)
Basophils Relative: 0 %
Eosinophils Absolute: 0 10*3/uL (ref 0.0–0.5)
Eosinophils Relative: 0 %
HCT: 35.9 % — ABNORMAL LOW (ref 36.0–46.0)
Hemoglobin: 11.6 g/dL — ABNORMAL LOW (ref 12.0–15.0)
Immature Granulocytes: 0 %
Lymphocytes Relative: 32 %
Lymphs Abs: 2.5 10*3/uL (ref 0.7–4.0)
MCH: 29.3 pg (ref 26.0–34.0)
MCHC: 32.3 g/dL (ref 30.0–36.0)
MCV: 90.7 fL (ref 80.0–100.0)
Monocytes Absolute: 0.5 10*3/uL (ref 0.1–1.0)
Monocytes Relative: 7 %
Neutro Abs: 4.7 10*3/uL (ref 1.7–7.7)
Neutrophils Relative %: 61 %
Platelets: 212 10*3/uL (ref 150–400)
RBC: 3.96 MIL/uL (ref 3.87–5.11)
RDW: 14.6 % (ref 11.5–15.5)
WBC: 7.7 10*3/uL (ref 4.0–10.5)
nRBC: 0 % (ref 0.0–0.2)

## 2021-11-26 LAB — COMPREHENSIVE METABOLIC PANEL
ALT: 9 U/L (ref 0–44)
AST: 15 U/L (ref 15–41)
Albumin: 3.8 g/dL (ref 3.5–5.0)
Alkaline Phosphatase: 66 U/L (ref 38–126)
Anion gap: 5 (ref 5–15)
BUN: 7 mg/dL (ref 6–20)
CO2: 24 mmol/L (ref 22–32)
Calcium: 8.5 mg/dL — ABNORMAL LOW (ref 8.9–10.3)
Chloride: 105 mmol/L (ref 98–111)
Creatinine, Ser: 0.68 mg/dL (ref 0.44–1.00)
GFR, Estimated: >60 mL/min (ref >60)
Glucose, Bld: 118 mg/dL — ABNORMAL HIGH (ref 70–99)
Potassium: 3.7 mmol/L (ref 3.5–5.1)
Sodium: 134 mmol/L — ABNORMAL LOW (ref 135–145)
Total Bilirubin: 0.4 mg/dL (ref 0.3–1.2)
Total Protein: 6.7 g/dL (ref 6.5–8.1)

## 2021-11-26 LAB — LACTIC ACID, PLASMA
Lactic Acid, Venous: 2.4 mmol/L (ref 0.5–1.9)
Lactic Acid, Venous: 4.8 mmol/L (ref 0.5–1.9)

## 2021-11-26 LAB — RESP PANEL BY RT-PCR (FLU A&B, COVID) ARPGX2
Influenza A by PCR: NEGATIVE
Influenza B by PCR: NEGATIVE
SARS Coronavirus 2 by RT PCR: NEGATIVE

## 2021-11-26 LAB — CK: Total CK: 37 U/L — ABNORMAL LOW (ref 38–234)

## 2021-11-26 SURGERY — INCISION AND DRAINAGE, ABSCESS
Anesthesia: General | Site: Hand | Laterality: Right

## 2021-11-26 MED ORDER — ONDANSETRON HCL 4 MG/2ML IJ SOLN: 4.0000 mg | Freq: Once | INTRAMUSCULAR | Status: DC | PRN

## 2021-11-26 MED ORDER — HYDROMORPHONE HCL 1 MG/ML IJ SOLN
INTRAMUSCULAR | Status: AC
  Filled 2021-11-26: qty 1

## 2021-11-26 MED ORDER — TRAZODONE HCL 50 MG PO TABS
25.0000 mg | ORAL_TABLET | Freq: Every evening | ORAL | Status: DC | PRN
  Administered 2021-11-26: 25 mg via ORAL
  Filled 2021-11-26: qty 0.5
  Filled 2021-11-26 (×2): qty 1

## 2021-11-26 MED ORDER — ONDANSETRON HCL 4 MG/2ML IJ SOLN
4.0000 mg | Freq: Four times a day (QID) | INTRAMUSCULAR | Status: DC | PRN
  Administered 2021-11-26 – 2021-11-28 (×6): 4 mg via INTRAVENOUS
  Filled 2021-11-26 (×7): qty 2

## 2021-11-26 MED ORDER — ONDANSETRON HCL 4 MG/2ML IJ SOLN
INTRAMUSCULAR | Status: AC
  Filled 2021-11-26: qty 2

## 2021-11-26 MED ORDER — SODIUM CHLORIDE FLUSH 0.9 % IV SOLN
INTRAVENOUS | Status: AC
  Filled 2021-11-26: qty 10

## 2021-11-26 MED ORDER — MIDAZOLAM HCL 2 MG/2ML IJ SOLN
INTRAMUSCULAR | Status: DC | PRN
  Administered 2021-11-26: 2 mg via INTRAVENOUS

## 2021-11-26 MED ORDER — PROPOFOL 500 MG/50ML IV EMUL
INTRAVENOUS | Status: DC | PRN
  Administered 2021-11-26: 25 ug/kg/min via INTRAVENOUS

## 2021-11-26 MED ORDER — OXYCODONE HCL 5 MG PO TABS: 5.0000 mg | ORAL_TABLET | Freq: Once | ORAL | Status: DC | PRN

## 2021-11-26 MED ORDER — LACTATED RINGERS IV SOLN
INTRAVENOUS | Status: DC | PRN
Start: 2021-11-26 — End: 2021-11-26

## 2021-11-26 MED ORDER — OXYCODONE HCL 5 MG/5ML PO SOLN: 5.0000 mg | Freq: Once | ORAL | Status: DC | PRN

## 2021-11-26 MED ORDER — SODIUM CHLORIDE 0.9 % IV SOLN
1.0000 g | Freq: Once | INTRAVENOUS | Status: AC
  Administered 2021-11-26: 1 g via INTRAVENOUS
  Filled 2021-11-26: qty 1

## 2021-11-26 MED ORDER — FENTANYL CITRATE (PF) 100 MCG/2ML IJ SOLN
INTRAMUSCULAR | Status: AC
  Filled 2021-11-26: qty 2

## 2021-11-26 MED ORDER — METOCLOPRAMIDE HCL 5 MG/ML IJ SOLN
10.0000 mg | Freq: Four times a day (QID) | INTRAMUSCULAR | Status: DC | PRN
  Administered 2021-11-27: 10 mg via INTRAVENOUS
  Filled 2021-11-26: qty 2

## 2021-11-26 MED ORDER — SODIUM CHLORIDE 0.9 % IV SOLN
2.0000 g | Freq: Three times a day (TID) | INTRAVENOUS | Status: DC
  Filled 2021-11-26 (×4): qty 2

## 2021-11-26 MED ORDER — LIDOCAINE HCL (CARDIAC) PF 100 MG/5ML IV SOSY
PREFILLED_SYRINGE | INTRAVENOUS | Status: DC | PRN
  Administered 2021-11-26: 60 mg via INTRAVENOUS

## 2021-11-26 MED ORDER — ONDANSETRON HCL 4 MG/2ML IJ SOLN
INTRAMUSCULAR | Status: DC | PRN
  Administered 2021-11-26 (×2): 4 mg via INTRAVENOUS

## 2021-11-26 MED ORDER — DEXMEDETOMIDINE HCL IN NACL 200 MCG/50ML IV SOLN
INTRAVENOUS | Status: DC | PRN
  Administered 2021-11-26 (×2): 20 ug via INTRAVENOUS

## 2021-11-26 MED ORDER — PROPOFOL 10 MG/ML IV BOLUS
INTRAVENOUS | Status: AC
  Filled 2021-11-26: qty 20

## 2021-11-26 MED ORDER — LACTATED RINGERS IV SOLN: INTRAVENOUS | Status: AC

## 2021-11-26 MED ORDER — ACETAMINOPHEN 325 MG PO TABS
650.0000 mg | ORAL_TABLET | Freq: Four times a day (QID) | ORAL | Status: DC | PRN
  Administered 2021-11-27: 650 mg via ORAL
  Filled 2021-11-26: qty 2

## 2021-11-26 MED ORDER — PROPOFOL 500 MG/50ML IV EMUL
INTRAVENOUS | Status: AC
  Filled 2021-11-26: qty 50

## 2021-11-26 MED ORDER — ENOXAPARIN SODIUM 40 MG/0.4ML IJ SOSY: 40.0000 mg | PREFILLED_SYRINGE | INTRAMUSCULAR | Status: DC

## 2021-11-26 MED ORDER — SODIUM CHLORIDE 0.9 % IV SOLN
1.0000 g | INTRAVENOUS | Status: DC
  Administered 2021-11-26: 1 g via INTRAVENOUS
  Filled 2021-11-26: qty 10
  Filled 2021-11-26: qty 1

## 2021-11-26 MED ORDER — PROPOFOL 500 MG/50ML IV EMUL: INTRAVENOUS | Status: DC | PRN

## 2021-11-26 MED ORDER — ACETAMINOPHEN 10 MG/ML IV SOLN: 1000.0000 mg | Freq: Once | INTRAVENOUS | Status: DC | PRN

## 2021-11-26 MED ORDER — ACETAMINOPHEN 650 MG RE SUPP
650.0000 mg | Freq: Four times a day (QID) | RECTAL | Status: DC | PRN
  Filled 2021-11-26: qty 1

## 2021-11-26 MED ORDER — EPHEDRINE SULFATE 50 MG/ML IJ SOLN
INTRAMUSCULAR | Status: DC | PRN
  Administered 2021-11-26 (×4): 5 mg via INTRAVENOUS

## 2021-11-26 MED ORDER — PROPOFOL 10 MG/ML IV BOLUS
INTRAVENOUS | Status: DC | PRN
Start: 2021-11-26 — End: 2021-11-26
  Administered 2021-11-26: 200 mg via INTRAVENOUS

## 2021-11-26 MED ORDER — FENTANYL CITRATE (PF) 100 MCG/2ML IJ SOLN
INTRAMUSCULAR | Status: DC | PRN
  Administered 2021-11-26: 25 ug via INTRAVENOUS
  Administered 2021-11-26 (×3): 50 ug via INTRAVENOUS
  Administered 2021-11-26: 75 ug via INTRAVENOUS
  Administered 2021-11-26 (×2): 25 ug via INTRAVENOUS

## 2021-11-26 MED ORDER — SODIUM CHLORIDE 0.9 % IV SOLN
1.0000 g | Freq: Three times a day (TID) | INTRAVENOUS | Status: DC
  Administered 2021-11-26 – 2021-11-28 (×6): 1 g via INTRAVENOUS
  Filled 2021-11-26 (×8): qty 1

## 2021-11-26 MED ORDER — ACETAMINOPHEN 10 MG/ML IV SOLN
INTRAVENOUS | Status: AC
  Filled 2021-11-26: qty 100

## 2021-11-26 MED ORDER — FENTANYL CITRATE (PF) 100 MCG/2ML IJ SOLN: 25.0000 ug | INTRAMUSCULAR | Status: DC | PRN

## 2021-11-26 MED ORDER — CLINDAMYCIN PHOSPHATE 900 MG/50ML IV SOLN
900.0000 mg | Freq: Three times a day (TID) | INTRAVENOUS | Status: DC
  Administered 2021-11-26: 900 mg via INTRAVENOUS
  Filled 2021-11-26 (×3): qty 50

## 2021-11-26 MED ORDER — LACTATED RINGERS IV SOLN: INTRAVENOUS | Status: DC

## 2021-11-26 MED ORDER — NEOMYCIN-POLYMYXIN B GU 40-200000 IR SOLN
Status: AC
  Filled 2021-11-26: qty 4

## 2021-11-26 MED ORDER — SUCCINYLCHOLINE CHLORIDE 200 MG/10ML IV SOSY
PREFILLED_SYRINGE | INTRAVENOUS | Status: DC | PRN
  Administered 2021-11-26: 60 mg via INTRAVENOUS

## 2021-11-26 MED ORDER — NEOMYCIN-POLYMYXIN B GU 40-200000 IR SOLN
Status: DC | PRN
  Administered 2021-11-26: 12 mL

## 2021-11-26 MED ORDER — CLINDAMYCIN PHOSPHATE 600 MG/50ML IV SOLN: 600.0000 mg | Freq: Three times a day (TID) | INTRAVENOUS | Status: DC

## 2021-11-26 MED ORDER — CLINDAMYCIN PHOSPHATE 600 MG/50ML IV SOLN
600.0000 mg | Freq: Three times a day (TID) | INTRAVENOUS | Status: DC
  Administered 2021-11-26 – 2021-11-28 (×5): 600 mg via INTRAVENOUS
  Filled 2021-11-26 (×7): qty 50

## 2021-11-26 MED ORDER — EPHEDRINE 5 MG/ML INJ
INTRAVENOUS | Status: AC
  Filled 2021-11-26: qty 10

## 2021-11-26 MED ORDER — HYDROMORPHONE HCL 1 MG/ML IJ SOLN
0.5000 mg | INTRAMUSCULAR | Status: DC | PRN
  Administered 2021-11-26 (×2): 0.5 mg via INTRAVENOUS

## 2021-11-26 MED ORDER — GENTAMICIN SULFATE 40 MG/ML IJ SOLN
INTRAMUSCULAR | Status: AC
  Filled 2021-11-26: qty 2

## 2021-11-26 MED ORDER — LORAZEPAM 2 MG/ML IJ SOLN
0.5000 mg | Freq: Once | INTRAMUSCULAR | Status: AC | PRN
  Administered 2021-11-26: 0.5 mg via INTRAVENOUS

## 2021-11-26 MED ORDER — ONDANSETRON HCL 4 MG PO TABS: 4.0000 mg | ORAL_TABLET | Freq: Four times a day (QID) | ORAL | Status: DC | PRN

## 2021-11-26 MED ORDER — MORPHINE SULFATE (PF) 2 MG/ML IV SOLN
2.0000 mg | INTRAVENOUS | Status: DC | PRN
  Administered 2021-11-26 – 2021-11-27 (×3): 2 mg via INTRAVENOUS
  Filled 2021-11-26 (×3): qty 1

## 2021-11-26 MED ORDER — HYDROMORPHONE HCL 1 MG/ML IJ SOLN
INTRAMUSCULAR | Status: AC
  Administered 2021-11-26: 0.5 mg via INTRAVENOUS
  Filled 2021-11-26: qty 1

## 2021-11-26 MED ORDER — ENOXAPARIN SODIUM 40 MG/0.4ML IJ SOSY
40.0000 mg | PREFILLED_SYRINGE | INTRAMUSCULAR | Status: DC
  Administered 2021-11-26 – 2021-11-27 (×2): 40 mg via SUBCUTANEOUS
  Filled 2021-11-26 (×2): qty 0.4

## 2021-11-26 MED ORDER — MAGNESIUM HYDROXIDE 400 MG/5ML PO SUSP: 30.0000 mL | Freq: Every day | ORAL | Status: DC | PRN

## 2021-11-26 MED ORDER — MIDAZOLAM HCL 2 MG/2ML IJ SOLN
INTRAMUSCULAR | Status: AC
  Filled 2021-11-26: qty 2

## 2021-11-26 MED ORDER — LORAZEPAM 2 MG/ML IJ SOLN
1.0000 mg | INTRAMUSCULAR | Status: DC | PRN
  Administered 2021-11-26 – 2021-11-27 (×3): 1 mg via INTRAVENOUS
  Filled 2021-11-26 (×2): qty 1
  Filled 2021-11-26: qty 0.5
  Filled 2021-11-26: qty 1

## 2021-11-26 MED ORDER — ACETAMINOPHEN 10 MG/ML IV SOLN
INTRAVENOUS | Status: DC | PRN
  Administered 2021-11-26: 1000 mg via INTRAVENOUS

## 2021-11-26 MED ORDER — SODIUM CHLORIDE 0.9 % IV SOLN: INTRAVENOUS | Status: DC

## 2021-11-26 MED ORDER — SODIUM CHLORIDE 0.9 % IV SOLN: 2.0000 g | Freq: Two times a day (BID) | INTRAVENOUS | Status: DC

## 2021-11-26 MED ORDER — LORAZEPAM 2 MG/ML IJ SOLN
INTRAMUSCULAR | Status: AC
  Filled 2021-11-26: qty 1

## 2021-11-26 MED ORDER — PHENYLEPHRINE HCL (PRESSORS) 10 MG/ML IV SOLN
INTRAVENOUS | Status: DC | PRN
  Administered 2021-11-26: 100 ug via INTRAVENOUS

## 2021-11-26 MED ORDER — IOHEXOL 300 MG/ML  SOLN
75.0000 mL | Freq: Once | INTRAMUSCULAR | Status: AC | PRN
  Administered 2021-11-26: 75 mL via INTRAVENOUS

## 2021-11-26 SURGICAL SUPPLY — 51 items
BLADE SURG MINI STRL (BLADE) ×2 IMPLANT
BNDG CMPR STD VLCR NS LF 5.8X4 (GAUZE/BANDAGES/DRESSINGS)
BNDG ELASTIC 2X5.8 VLCR STR LF (GAUZE/BANDAGES/DRESSINGS) ×1 IMPLANT
BNDG ELASTIC 4X5.8 VLCR NS LF (GAUZE/BANDAGES/DRESSINGS) ×2 IMPLANT
BNDG ESMARK 4X12 TAN STRL LF (GAUZE/BANDAGES/DRESSINGS) ×1 IMPLANT
CORD BIP STRL DISP 12FT (MISCELLANEOUS) ×2 IMPLANT
CUFF TOURN SGL QUICK 18X4 (TOURNIQUET CUFF) IMPLANT
DRAPE ORTHO SPLIT 77X108 STRL (DRAPES) ×2
DRAPE SURG 17X11 SM STRL (DRAPES) ×1 IMPLANT
DRAPE SURG ORHT 6 SPLT 77X108 (DRAPES) ×1 IMPLANT
DRSG GAUZE FLUFF 36X18 (GAUZE/BANDAGES/DRESSINGS) ×1 IMPLANT
DURAPREP 26ML APPLICATOR (WOUND CARE) ×3 IMPLANT
ELECT REM PT RETURN 9FT ADLT (ELECTROSURGICAL) ×2
ELECTRODE REM PT RTRN 9FT ADLT (ELECTROSURGICAL) ×1 IMPLANT
FORCEPS JEWEL BIP 4-3/4 STR (INSTRUMENTS) ×2 IMPLANT
GAUZE 4X4 16PLY ~~LOC~~+RFID DBL (SPONGE) ×4 IMPLANT
GAUZE SPONGE 4X4 12PLY STRL (GAUZE/BANDAGES/DRESSINGS) ×3 IMPLANT
GAUZE XEROFORM 1X8 LF (GAUZE/BANDAGES/DRESSINGS) ×2 IMPLANT
GLOVE SURG ORTHO LTX SZ9 (GLOVE) ×6 IMPLANT
GLOVE SURG UNDER POLY LF SZ9 (GLOVE) ×3 IMPLANT
GOWN STRL REUS TWL 2XL XL LVL4 (GOWN DISPOSABLE) ×3 IMPLANT
GOWN STRL REUS W/ TWL LRG LVL3 (GOWN DISPOSABLE) ×1 IMPLANT
GOWN STRL REUS W/TWL LRG LVL3 (GOWN DISPOSABLE) ×2
KIT TURNOVER KIT A (KITS) ×2 IMPLANT
MANIFOLD NEPTUNE II (INSTRUMENTS) ×2 IMPLANT
NDL FILTER BLUNT 18X1 1/2 (NEEDLE) ×1 IMPLANT
NEEDLE FILTER BLUNT 18X 1/2SAF (NEEDLE) ×2
NEEDLE FILTER BLUNT 18X1 1/2 (NEEDLE) ×2 IMPLANT
NS IRRIG 500ML POUR BTL (IV SOLUTION) ×3 IMPLANT
PACK EXTREMITY ARMC (MISCELLANEOUS) ×2 IMPLANT
PAD CAST CTTN 4X4 STRL (SOFTGOODS) ×2 IMPLANT
PADDING CAST 4IN STRL (MISCELLANEOUS)
PADDING CAST BLEND 4X4 STRL (MISCELLANEOUS) ×3 IMPLANT
PADDING CAST COTTON 4X4 STRL (SOFTGOODS)
SLING ARM LRG DEEP (SOFTGOODS) ×1 IMPLANT
SLING ARM M TX990204 (SOFTGOODS) ×2 IMPLANT
SPLINT CAST 1 STEP 3X12 (MISCELLANEOUS) ×1 IMPLANT
STOCKINETTE 48X4 2 PLY STRL (GAUZE/BANDAGES/DRESSINGS) ×1 IMPLANT
STOCKINETTE STRL 4IN 9604848 (GAUZE/BANDAGES/DRESSINGS) ×2 IMPLANT
STRIP CLOSURE SKIN 1/2X4 (GAUZE/BANDAGES/DRESSINGS) ×2 IMPLANT
SUT ETHILON 3-0 FS-10 30 BLK (SUTURE)
SUT ETHILON 4-0 (SUTURE) ×8
SUT ETHILON 4-0 FS2 18XMFL BLK (SUTURE) ×4
SUT ETHILON 5-0 FS-2 18 BLK (SUTURE) ×1 IMPLANT
SUTURE EHLN 3-0 FS-10 30 BLK (SUTURE) ×1 IMPLANT
SUTURE ETHLN 4-0 FS2 18XMF BLK (SUTURE) ×1 IMPLANT
SWAB CULTURE AMIES ANAERIB BLU (MISCELLANEOUS) ×1 IMPLANT
SYR 10ML LL (SYRINGE) ×2 IMPLANT
SYR 3ML LL SCALE MARK (SYRINGE) ×2 IMPLANT
SYR BULB IRRIG 60ML STRL (SYRINGE) ×1 IMPLANT
WATER STERILE IRR 500ML POUR (IV SOLUTION) ×2 IMPLANT

## 2021-11-26 NOTE — ED Notes (Signed)
Patient transported to CT 

## 2021-11-26 NOTE — Plan of Care (Signed)

## 2021-11-26 NOTE — Progress Notes (Signed)
Notified bedside nurse of need to draw repeat lactic acid. 

## 2021-11-26 NOTE — Progress Notes (Signed)
Sepsis tracking by eLINK 

## 2021-11-26 NOTE — Anesthesia Procedure Notes (Signed)
Procedure Name: LMA Insertion Date/Time: 11/26/2021 5:13 AM Performed by: Waldo Laine, CRNA Pre-anesthesia Checklist: Patient identified, Patient being monitored, Timeout performed, Emergency Drugs available and Suction available Patient Re-evaluated:Patient Re-evaluated prior to induction Oxygen Delivery Method: Circle system utilized Preoxygenation: Pre-oxygenation with 100% oxygen Induction Type: IV induction Ventilation: Mask ventilation without difficulty LMA: LMA inserted LMA Size: 3.5 Tube type: Oral Number of attempts: 1 Placement Confirmation: positive ETCO2 and breath sounds checked- equal and bilateral Tube secured with: Tape Dental Injury: Teeth and Oropharynx as per pre-operative assessment

## 2021-11-26 NOTE — H&P (Signed)
PREOPERATIVE H&P  Chief Complaint: Necrotizing fasciitis right hand, wrist and forearm  HPI: Chelsea Stanley is a 56 y.o. female who presented to the ER tonight with a worsening infection involving her right hand wrist and forearm.  Patient explains that she cut her right thumb on glass approximately a week ago and had worsening swelling pain and erythema over the past 24 to 48 hours.  The initial laceration is over the radial side of the right thumb.  Patient reports cleaning a fish tank after sustaining a laceration.  Patient presented to the [facility] emergency department this evening where x-rays and a CT scan showed possible gas formation in the soft tissues of the right hand and wrist.  Attempts were made to transfer the patient to [facility] for hand specialty but were full and could not accept transfers.  Patient is being brought emergently to the OR for debridement.  Patient reports anaphylactic reactions to clindamycin and penicillin according to EMR.  She reports allergies to vancomycin, levofloxacin, sulfa and latex.  Patient received cefepime and clindamycin in the ER.  She received for meropenem as well.  Past Medical History:  Diagnosis Date   Depression    Past Surgical History:  Procedure Laterality Date   BREAST SURGERY     Social History   Socioeconomic History   Marital status: Single    Spouse name: Not on file   Number of children: Not on file   Years of education: Not on file   Highest education level: Not on file  Occupational History   Not on file  Tobacco Use   Smoking status: Never   Smokeless tobacco: Never  Substance and Sexual Activity   Alcohol use: No   Drug use: Not on file   Sexual activity: Not on file  Other Topics Concern   Not on file  Social History Narrative   Not on file   Social Determinants of Health   Financial Resource Strain: Not on file  Food Insecurity: Not on file  Transportation Needs: Not on file   Physical Activity: Not on file  Stress: Not on file  Social Connections: Not on file   No family history on file. Allergies  Allergen Reactions   Clindamycin/Lincomycin Anaphylaxis   Peach [Prunus Persica] Anaphylaxis    Any fuzzy fruit   Penicillins Anaphylaxis    Has patient had a PCN reaction causing immediate rash, facial/tongue/throat swelling, SOB or lightheadedness with hypotension: Yes Has patient had a PCN reaction causing severe rash involving mucus membranes or skin necrosis: Yes Has patient had a PCN reaction that required hospitalization: Yes Has patient had a PCN reaction occurring within the last 10 years: No If all of the above answers are "NO", then may proceed with Cephalosporin use.    Strawberry (Diagnostic) Anaphylaxis   Honey Bee Venom [Bee Venom]    Peanut-Containing Drug Products Swelling   Benadryl [Diphenhydramine] Hives, Anxiety and Palpitations   Latex Rash   Levofloxacin Rash   Sulfa Antibiotics Rash   Vancomycin Rash   Voltaren [Diclofenac] Rash   Prior to Admission medications   Medication Sig Start Date End Date Taking? Authorizing Provider  Butalbital-APAP-Caffeine 50-325-40 MG capsule Take 1 capsule by mouth every 4 (four) hours as needed.    [provider]  clonazePAM (KLONOPIN) 0.5 MG tablet Take 3 tablets by mouth at bedtime.    [provider]  doxycycline (VIBRA-TABS) 100 MG tablet Take 1 tablet (100 mg total) by mouth  2 (two) times daily. 09/13/20   Menshew, Charlesetta Ivory, PA-C  hydrOXYzine (ATARAX/VISTARIL) 50 MG tablet Take 50 mg by mouth daily as needed. 09/22/16   [provider]  Melatonin 5 MG TABS Take 5 mg by mouth at bedtime as needed.    [provider]  mirtazapine (REMERON) 15 MG tablet Take 15 mg by mouth at bedtime.    [provider]  oxymetazoline (AFRIN) 0.05 % nasal spray Place 1 spray into both nostrils 2 (two) times daily as needed for congestion.    [provider]   polyethylene glycol (MIRALAX / GLYCOLAX) packet Take 17 g by mouth every other day.    [provider]  promethazine (PHENERGAN) 12.5 MG tablet Take 12.5 mg by mouth every 6 (six) hours as needed for nausea or vomiting.    [provider]  sertraline (ZOLOFT) 50 MG tablet Take 50 mg by mouth at bedtime.    [provider]  topiramate (TOPAMAX) 25 MG tablet Take 75 mg by mouth daily.    [provider]  traZODone (DESYREL) 100 MG tablet Take 100 mg by mouth daily. 09/22/16   [provider]     Positive ROS: All other systems have been reviewed and were otherwise negative with the exception of those mentioned in the HPI and as above.  Physical Exam: General: Alert, no acute distress Cardiovascular: Regular rate and rhythm, no murmurs rubs or gallops.  No pedal edema Respiratory: Clear to auscultation bilaterally, no wheezes rales or rhonchi. No cyanosis, no use of accessory musculature GI: No organomegaly, abdomen is soft and non-tender nondistended with positive bowel sounds. Skin: Skin intact, no lesions within the operative field. Neurologic: Sensation intact distally Psychiatric: Patient is competent for consent with normal mood and affect Lymphatic: No cervical lymphadenopathy  MUSCULOSKELETAL: Right hand: Patient has a small laceration over the radial aspect of the proximal phalanx of the right thumb without active drainage.  She has diffuse swelling involving the proximal phalanx thenar eminence and proximal radial forearm.  Dorsally she has erythema extending to approximately the fourth metacarpal and extends into the distal forearm.  Patient is tender to palpation.  She can flex and extend her digits and has intact sensation light touch.  Her fingers well-perfused.  Assessment: Necrotizing fasciitis, right hand, wrist and forearm  Plan: Plan for Procedure(s): Incision and debridement of necrotizing fasciitis of the right hand wrist and  forearm  I reviewed the details of the operation as well as the postoperative course with the patient.  The surgical site was marked according to hospital's correct site of surgery protocol.  Answered all questions by the patient.  I discussed the risks and benefits of surgery. The risks include but are not limited to spread of the infection, loss of limb or life, bleeding, nerve or blood vessel injury, joint stiffness or loss of motion, persistent pain, weakness or instability and the need for further surgery. Medical risks include but are not limited to DVT and pulmonary embolism, myocardial infarction, stroke, pneumonia, respiratory failure and death. Patient understood these risks and was in agreement with the plan for surgical debridement.Juanell Fairly, MD   11/26/2021 5:00 AM

## 2021-11-26 NOTE — Consult Note (Addendum)
NAME: Chelsea Stanley  DOB: 10-25-1966  MRN: [id number]  Date/Time: 11/26/2021 2:04 PM  REQUESTING PROVIDER: Dr.Krasinski Subjective:  REASON FOR CONSULT: rt hand infection ? Chelsea Stanley is a 56 y.o. female with a history of anxiety, seizure disorder since 2016,  possible stroke, h/o recurrent skin and soft tissue abscesses in the past ,  presents to the ED with pain, swelling, redness of rt hand of 2 days duration- pt initally culture her rt hand while cleaning a picture frame-it bled and she took care of it locally . A few days later she cleaned her fish tank without any gloves- within 2 days the had started to become red, painful are swollen. She also had some chills and sweats BP 97/60, Temp 99.3, HR 71 Wbc 7.7, HB 11.6, PLT 212, cr 0.68 The rt hand    Blood culture sent. Imaging showed gas in the tissue. 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 EF was 10% and later normalized- concern for takatsubo   Says she had ovarian cancer and had oophorectomy when she was young  Past Surgical History:  Procedure Laterality Date   BREAST SURGERY      Social History    Socioeconomic History   Marital status: Single    Spouse name: Not on file   Number of children: Not on file   Years of education: Not on file   Highest education level: Not on file  Occupational History   Not on file  Tobacco Use   Smoking status: Never   Smokeless tobacco: Never  Substance and Sexual Activity   Alcohol use: No   Drug use: Not on file   Sexual activity: Not on file  Other Topics Concern   Not on file  Social History Narrative   Not on file   Social Determinants of Health   Financial Resource Strain: Not on file  Food Insecurity: Not on file  Transportation Needs: Not on file  Physical Activity: Not on file  Stress: Not on file  Social Connections: Not on file  Intimate Partner Violence: Not on file    No family history on file. Allergies  Allergen Reactions   Clindamycin/Lincomycin Anaphylaxis   Peach [Prunus Persica] Anaphylaxis    Any fuzzy fruit   Penicillins Anaphylaxis    Has patient had a PCN reaction causing immediate rash, facial/tongue/throat swelling, SOB or lightheadedness with hypotension: Yes Has patient had a PCN reaction causing severe rash involving mucus membranes or skin necrosis: Yes Has patient had a PCN reaction that required hospitalization: Yes Has patient had a PCN reaction occurring within the last 10 years: No If all of the above answers are "NO", then may proceed with Cephalosporin use.    Strawberry (Diagnostic) Anaphylaxis   Honey Bee  Venom [Bee Venom]    Peanut-Containing Drug Products Swelling   Benadryl [Diphenhydramine] Hives, Anxiety and Palpitations   Latex Rash   Levofloxacin Rash   Sulfa Antibiotics Rash   Vancomycin Rash   Voltaren [Diclofenac] Rash   I? Current Facility-Administered Medications  Medication Dose Route Frequency Provider Last Rate Last Admin   0.9 %  sodium chloride infusion   Intravenous Continuous Mansy, Jan A, MD       acetaminophen (TYLENOL) tablet 650 mg  650 mg Oral Q6H PRN  Mansy, Jan A, MD       Or   acetaminophen (TYLENOL) suppository 650 mg  650 mg Rectal Q6H PRN Mansy, Jan A, MD       ceFEPIme (MAXIPIME) 2 g in sodium chloride 0.9 % 100 mL IVPB  2 g Intravenous Q8H Aigner Horseman, Joellyn Quails, MD       clindamycin (CLEOCIN) IVPB 900 mg  900 mg Intravenous Q8H Tsosie Billing, MD       [START ON 11/27/2021] enoxaparin (LOVENOX) injection 40 mg  40 mg Subcutaneous Q24H [place], MD       HYDROmorphone (DILAUDID) 1 MG/ML injection            lactated ringers infusion   Intravenous Continuous Alfred Levins, [place], MD 150 mL/hr at 11/26/21 0151 New Bag at 11/26/21 0151   LORazepam (ATIVAN) 2 MG/ML injection            LORazepam (ATIVAN) injection 1 mg  1 mg Intravenous Q1H PRN Mansy, Jan A, MD       magnesium hydroxide (MILK OF MAGNESIA) suspension 30 mL  30 mL Oral Daily PRN Mansy, Jan A, MD       morphine 2 MG/ML injection 2 mg  2 mg Intravenous Q4H PRN Mansy, Jan A, MD   2 mg at 11/26/21 1234   ondansetron (ZOFRAN) tablet 4 mg  4 mg Oral Q6H PRN Mansy, Jan A, MD       Or   ondansetron [facility]) injection 4 mg  4 mg Intravenous Q6H PRN Mansy, Jan A, MD   4 mg at 11/26/21 1236   sodium chloride flush 0.9 % injection            traZODone (DESYREL) tablet 25 mg  25 mg Oral QHS PRN Mansy, Jan A, MD         Abtx:  Anti-infectives (From admission, onward)    Start     Dose/Rate Route Frequency Ordered Stop   11/26/21 1600  ceFEPIme (MAXIPIME) 2 g in sodium chloride 0.9 % 100 mL IVPB  Status:  Discontinued        2 g 200 mL/hr over 30 Minutes Intravenous Every 12 hours 11/26/21 0948 11/26/21 0949   11/26/21 1400  ceFEPIme (MAXIPIME) 2 g in sodium chloride 0.9 % 100 mL IVPB        2 g 200 mL/hr over 30 Minutes Intravenous Every 8 hours 11/26/21 0949     11/26/21 1400  clindamycin (CLEOCIN) IVPB 900 mg        900 mg 100 mL/hr over 30 Minutes Intravenous Every 8 hours 11/26/21 0949     11/26/21 0800  cefTRIAXone (ROCEPHIN) 1 g in sodium chloride 0.9 % 100  mL IVPB  Status:  Discontinued        1 g 200 mL/hr over 30 Minutes Intravenous Every 24 hours 11/26/21 0750 11/26/21 0948   11/26/21 0800  clindamycin (CLEOCIN) IVPB 600 mg  Status:  Discontinued  600 mg 100 mL/hr over 30 Minutes Intravenous Every 8 hours 11/26/21 0750 11/26/21 0949   11/26/21 0400  meropenem (MERREM) 1 g in sodium chloride 0.9 % 100 mL IVPB        1 g 200 mL/hr over 30 Minutes Intravenous  Once 11/26/21 0352 11/26/21 0447   11/26/21 0000  ceFEPIme (MAXIPIME) 1 g in sodium chloride 0.9 % 100 mL IVPB        1 g 200 mL/hr over 30 Minutes Intravenous  Once 11/25/21 2351 11/26/21 0151   11/26/21 0000  clindamycin (CLEOCIN) IVPB 900 mg        900 mg 100 mL/hr over 30 Minutes Intravenous  Once 11/25/21 2351 11/26/21 0247       REVIEW OF SYSTEMS:  Const:  fever,  chills, negative weight loss Eyes: negative diplopia or visual changes, negative eye pain ENT: negative coryza, negative sore throat Resp: negative cough, hemoptysis, dyspnea Cards: negative for chest pain, palpitations, lower extremity edema GU: negative for frequency, dysuria and hematuria GI: Negative for abdominal pain, diarrhea, bleeding, constipation Skin: as above Heme: negative for easy bruising and gum/nose bleeding MS:rt hand pain and swelling Neurolo: headaches, dizziness,  Psych:  anxiety, depression agarophobia Endocrine: negative for thyroid, diabetes Allergy/Immunology- multiple antibiotic allergies Clinda was listed as anaphylaxis but she tolerated it Objective:  VITALS:  BP 95/63 (BP Location: Left Arm)    Pulse 81    Temp 97.7 F (36.5 C)    Resp 16    SpO2 99%  PHYSICAL EXAM:  General: Alert, cooperative, no distress, appears stated age. Pale, frail Head: Normocephalic, without obvious abnormality, atraumatic. Eyes: Conjunctivae clear, anicteric sclerae. Pupils are equal ENT Nares normal. No drainage or sinus tenderness. Lips, mucosa, and tongue normal. No Thrush Neck: Supple,  symmetrical, no adenopathy, thyroid: non tender no carotid bruit and no JVD. Scars breast Back: No CVA tenderness. Lungs: Clear to auscultation bilaterally. No Wheezing or Rhonchi. No rales. Heart: Regular rate and rhythm, no murmur, rub or gallop. Abdomen: Soft, non-tender,not distended. Bowel sounds normal. No masses Extremities: rt arm/hand surgical dressing Skin: No rashes or lesions. Or bruising Lymph: Cervical, supraclavicular normal. Neurologic: Grossly non-focal Pertinent Labs Lab Results CBC    Component Value Date/Time   WBC 7.7 11/26/2021 0008   RBC 3.96 11/26/2021 0008   HGB 11.6 (L) 11/26/2021 0008   HCT 35.9 (L) 11/26/2021 0008   PLT 212 11/26/2021 0008   MCV 90.7 11/26/2021 0008   MCH 29.3 11/26/2021 0008   MCHC 32.3 11/26/2021 0008   RDW 14.6 11/26/2021 0008   LYMPHSABS 2.5 11/26/2021 0008   MONOABS 0.5 11/26/2021 0008   EOSABS 0.0 11/26/2021 0008   BASOSABS 0.0 11/26/2021 0008    CMP Latest Ref Rng & Units 11/26/2021 09/13/2020 07/16/2018  Glucose 70 - 99 mg/dL 118(H) 85 90  BUN 6 - 20 mg/dL 7 9 11   Creatinine 0.44 - 1.00 mg/dL 0.68 0.67 0.63  Sodium 135 - 145 mmol/L 134(L) 139 138  Potassium 3.5 - 5.1 mmol/L 3.7 3.6 4.7  Chloride 98 - 111 mmol/L 105 107 110  CO2 22 - 32 mmol/L 24 25 24   Calcium 8.9 - 10.3 mg/dL 8.5(L) 8.9 8.5(L)  Total Protein 6.5 - 8.1 g/dL 6.7 7.0 6.3(L)  Total Bilirubin 0.3 - 1.2 mg/dL 0.4 0.6 0.3  Alkaline Phos 38 - 126 U/L 66 84 57  AST 15 - 41 U/L 15 21 30   ALT 0 - 44 U/L 9 16 11  Microbiology: Recent Results (from the past 240 hour(s))  Blood culture (routine x 2)     Status: None (Preliminary result)   Collection Time: 11/26/21 12:08 AM   Specimen: BLOOD  Result Value Ref Range Status   Specimen Description BLOOD LEFT ASSIST CONTROL  Final   Special Requests   Final    BOTTLES DRAWN AEROBIC AND ANAEROBIC Blood Culture results may not be optimal due to an excessive volume of blood received in culture bottles    Culture   Final    NO GROWTH < 12 HOURS Performed at [facility], [address], [place], [place] [address]    Report Status PENDING  Incomplete  Blood culture (routine x 2)     Status: None (Preliminary result)   Collection Time: 11/26/21 12:08 AM   Specimen: BLOOD  Result Value Ref Range Status   Specimen Description BLOOD LEFT FOREARM  Final   Special Requests   Final    BOTTLES DRAWN AEROBIC AND ANAEROBIC Blood Culture results may not be optimal due to an excessive volume of blood received in culture bottles   Culture   Final    NO GROWTH < 12 HOURS Performed at [facility], [address], [place], [place] [address]    Report Status PENDING  Incomplete  Resp Panel by RT-PCR (Flu A&B, Covid) Nasopharyngeal Swab     Status: None   Collection Time: 11/26/21 12:08 AM   Specimen: Nasopharyngeal Swab; Nasopharyngeal(NP) swabs in vial transport medium  Result Value Ref Range Status   SARS Coronavirus 2 by RT PCR NEGATIVE NEGATIVE Final    Comment: (NOTE) SARS-CoV-2 target nucleic acids are NOT DETECTED.  The SARS-CoV-2 RNA is generally detectable in upper respiratory specimens during the acute phase of infection. The lowest concentration of SARS-CoV-2 viral copies this assay can detect is 138 copies/mL. A negative result does not preclude SARS-Cov-2 infection and should not be used as the sole basis for treatment or other patient management decisions. A negative result may occur with  improper specimen collection/handling, submission of specimen other than nasopharyngeal swab, presence of viral mutation(s) within the areas targeted by this assay, and inadequate number of viral copies(<138 copies/mL). A negative result must be combined with clinical observations, patient history, and epidemiological information. The expected result is Negative.  Fact Sheet for Patients:  EntrepreneurPulse.com.au  Fact Sheet for Healthcare Providers:   IncredibleEmployment.be  This test is no t yet approved or cleared by the [place] FDA and  has been authorized for detection and/or diagnosis of SARS-CoV-2 by FDA under an Emergency Use Authorization (EUA). This EUA will remain  in effect (meaning this test can be used) for the duration of the COVID-19 declaration under Section 564(b)(1) of the Act, 21 U.S.C.section 360bbb-3(b)(1), unless the authorization is terminated  or revoked sooner.       Influenza A by PCR NEGATIVE NEGATIVE Final   Influenza B by PCR NEGATIVE NEGATIVE Final    Comment: (NOTE) The Xpert Xpress SARS-CoV-2/FLU/RSV plus assay is intended as an aid in the diagnosis of influenza from Nasopharyngeal swab specimens and should not be used as a sole basis for treatment. Nasal washings and aspirates are unacceptable for Xpert Xpress SARS-CoV-2/FLU/RSV testing.  Fact Sheet for Patients: EntrepreneurPulse.com.au  Fact Sheet for Healthcare Providers: IncredibleEmployment.be  This test is not yet approved or cleared by the [place] FDA and has been authorized for detection and/or diagnosis of SARS-CoV-2 by FDA under an Emergency Use Authorization (EUA). This EUA  will remain in effect (meaning this test can be used) for the duration of the COVID-19 declaration under Section 564(b)(1) of the Act, 21 U.S.C. section 360bbb-3(b)(1), unless the authorization is terminated or revoked.  Performed at [facility], [address] [place], [place] [address]   Aerobic/Anaerobic Culture w Gram Stain (surgical/deep wound)     Status: None (Preliminary result)   Collection Time: 11/26/21  6:26 AM   Specimen: PATH Other; Wound  Result Value Ref Range Status   Specimen Description   Final    WOUND Performed at [facility], [address] [place], [place] [address]    Special Requests   Final    RIGHT HAND Performed at [facility], [address] [place], [place] [address]    Gram Stain   Final    NO WBC SEEN NO ORGANISMS SEEN Performed at [facility], [place] [address] [place], [place] [address]    Culture PENDING  Incomplete   Report Status PENDING  Incomplete    IMAGING RESULTS:  I have personally reviewed the films Soft tissue edema in the distal forearm, wrist and hand, with small scattered air pockets in the soft tissues laterally, findings worrisome for a gas-forming cellulitis ? Impression/Recommendation ? RT hand and foream infection following a glass cult and then cleaning a fish tank- necrotizing soft tissue infection Gas in tissue Common organisms are gram negatives including aeromonas, pseudomonas, kleb,  and other common organisms like staph, strep, anerobes  Mycobacterium marinum is a concern but usually does not present acutely- will ask lab to do AFB culture   Was taken for surgery- no purulence noted Pt has been on many antibiotics today- cefepime/meropenem/ceftriaxone and clinda  Seizures this morning Will avoid cefepime  Will restart meropenem Continue clinda for antitoxin effect Add dapto for MRSA coverage Once culture results available  can de-escalate antibiotics No leucocytosis, no fever, normal creatinine, and normal LFTS  Multiple antibiotic allergies noted- vanco/PCN/levaquin Ignacia Bayley and clinda- but she is tolerating clindamycin- says she could take others while in the hospital  Anxiety/depression/agarophobia- on triple SSRIs at home ? ___________________________________________________ Discussed with patient, requesting provider Note:  This document was prepared using Dragon voice recognition software and may include unintentional dictation errors.

## 2021-11-26 NOTE — Consult Note (Signed)
Neurology Consult H&P  Chelsea Stanley MR# [id number] 11/26/2021   CC: seizure like activity  History is obtained from: patient and chart.  HPI: Chelsea Stanley is a 56 y.o. female PMHx as reviewed below severe anxiety, agoraphobia, depression sustained a laceration 2 weeks PTA and presented for 1 week of progressively worsening swelling, erythema and pain found to have right hand cellulitis s/p debridement with postop seizure like activity.  The following was taken from today's nursing progress note 09:03:  "Informed Dr. Wynetta Emery pt appeared to have seizure activity lying in bed.  Pt body started shaking for approximately 30 seconds with jaws clenched and eyes closed.   Pt not responding during episode to voice. Pt stated after episode "I;m in pain."  Pt was comforted by nursing staff.   Dr. Wynetta Emery order 0.5 mg ativan."   ROS: A complete ROS was performed and is negative except as noted in the HPI.   Past Medical History:  Diagnosis Date   Depression      No family history on file.  Social History:  reports that she has never smoked. She has never used smokeless tobacco. She reports that she does not drink alcohol. No history on file for drug use.   Prior to Admission medications   Medication Sig Start Date End Date Taking? Authorizing Provider  Butalbital-APAP-Caffeine 50-325-40 MG capsule Take 1 capsule by mouth every 4 (four) hours as needed.    [provider]  clonazePAM (KLONOPIN) 0.5 MG tablet Take 3 tablets by mouth at bedtime.    [provider]  doxycycline (VIBRA-TABS) 100 MG tablet Take 1 tablet (100 mg total) by mouth 2 (two) times daily. 09/13/20   Menshew, Dannielle Karvonen, PA-C  hydrOXYzine (ATARAX/VISTARIL) 50 MG tablet Take 50 mg by mouth daily as needed. 09/22/16   [provider]  Melatonin 5 MG TABS Take 5 mg by mouth at bedtime as needed.    [provider]  mirtazapine (REMERON) 15 MG tablet Take 15 mg by mouth at bedtime.     [provider]  oxymetazoline (AFRIN) 0.05 % nasal spray Place 1 spray into both nostrils 2 (two) times daily as needed for congestion.    [provider]  polyethylene glycol (MIRALAX / GLYCOLAX) packet Take 17 g by mouth every other day.    [provider]  promethazine (PHENERGAN) 12.5 MG tablet Take 12.5 mg by mouth every 6 (six) hours as needed for nausea or vomiting.    [provider]  sertraline (ZOLOFT) 50 MG tablet Take 50 mg by mouth at bedtime.    [provider]  topiramate (TOPAMAX) 25 MG tablet Take 75 mg by mouth daily.    [provider]  traZODone (DESYREL) 100 MG tablet Take 100 mg by mouth daily. 09/22/16   [provider]    Exam: Current vital signs: BP 95/63 (BP Location: Left Arm)    Pulse 81    Temp 97.7 F (36.5 C)    Resp 16    SpO2 99%   Physical Exam  Constitutional: Appears well-developed and well-nourished.  Psych: Affect appropriate to situation Eyes: No scleral injection HENT: No OP obstruction. Head: Normocephalic.  Cardiovascular: Normal rate and regular rhythm.  Respiratory: Effort normal, symmetric excursions bilaterally, no audible wheezing. GI: Soft.  No distension. There is no tenderness.  Skin: WDI  Neuro: Mental Status: Patient is awake, alert, oriented to person, place, month, year, and situation. Patient is able to give a clear and coherent  history. Speech  fluent, intact comprehension and repetition. No signs of aphasia or neglect. Visual Fields are full. Pupils are equal, round, and reactive to light. EOMI without ptosis or diploplia.  Facial sensation is symmetric to temperature Facial movement is symmetric.  Hearing is intact to voice. Uvula midline and palate elevates symmetrically. Shoulder shrug is symmetric. Tongue is midline without atrophy or fasciculations.  Tone is normal. Bulk is normal. 5/5 strength was present in all four extremities. Right arm bandaged  and currently not in pain. Sensation is symmetric to light touch and temperature in the arms and legs. Deep Tendon Reflexes: 2+ and symmetric in the biceps and patellae. Toes are downgoing bilaterally. FNF and HKS are intact bilaterally. Gait - Deferred  I have reviewed labs in epic and the pertinent results are: Lactic acid trending up 2.4--->4.8   Assessment: Chelsea Stanley is a 56 y.o. female PMHx as noted above found to have right hand cellulitis s/p debridement with seizure like activity in the PACU. She is at her baseline and has a diagnosis of epilepsy and followed for years by Dr. Gordy Clement neurologist. She said she had many EEGs in the past that were negative and does not feel that she needs another one. She has also tried several different antiseizure medications without benefit and would rather not try another and prefers to continue her current regimen.  She does have postop nausea and vomited twice.  Plan: - Continue to monitor. - Ativan 2mg  for seizure lasting more than 3 minutes or if she does not return to baseline and please call neurology..  - Seizure precautions. - Neurology will remain available for questions.  Electronically signed by:  Lynnae Sandhoff, MD Page: [id number] 11/26/2021, 1:22 PM  If 7pm- 7am, please page neurology on call as listed in [place].

## 2021-11-26 NOTE — ED Notes (Signed)
Spoke with Stark Klein @ [facility] for possible transfer

## 2021-11-26 NOTE — Progress Notes (Signed)
Eeg done 

## 2021-11-26 NOTE — Anesthesia Preprocedure Evaluation (Signed)
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   Normal right ventricular systolic function.   No significant valvular abnormalities.   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  Reproductive/Obstetrics                             Anesthesia Physical Anesthesia Plan  ASA: 3  Anesthesia Plan: General   Post-op Pain Management:    Induction: Intravenous  PONV Risk Score and Plan: 3 and Ondansetron, Dexamethasone and Treatment may vary due to age or medical condition  Airway Management Planned: LMA  Additional Equipment:   Intra-op Plan:    Post-operative Plan: Extubation in OR  Informed Consent: I have reviewed the patients History and Physical, chart, labs and discussed the procedure including the risks, benefits and alternatives for the proposed anesthesia with the patient or authorized representative who has indicated his/her understanding and acceptance.     Dental advisory given  Plan Discussed with: CRNA  Anesthesia Plan Comments: (Patient consented for risks of anesthesia including but not limited to:  - adverse reactions to medications - damage to eyes, teeth, lips or other oral mucosa - nerve damage due to positioning  - sore throat or hoarseness - damage to heart, brain, nerves, lungs, other parts of body or loss of life  Informed patient about role of CRNA in peri- and intra-operative care.  Patient voiced understanding.)        Anesthesia Quick Evaluation

## 2021-11-26 NOTE — Anesthesia Procedure Notes (Signed)
Procedure Name: Intubation Date/Time: 11/26/2021 5:25 AM Performed by: Waldo Laine, CRNA Pre-anesthesia Checklist: Patient identified, Patient being monitored, Timeout performed, Emergency Drugs available and Suction available Patient Re-evaluated:Patient Re-evaluated prior to induction Oxygen Delivery Method: Circle system utilized Preoxygenation: Pre-oxygenation with 100% oxygen Induction Type: IV induction Ventilation: Mask ventilation without difficulty Laryngoscope Size: 3 and McGraph Grade View: Grade I Tube type: Oral Tube size: 7.0 mm Number of attempts: 1 Airway Equipment and Method: Stylet Placement Confirmation: ETT inserted through vocal cords under direct vision, positive ETCO2 and breath sounds checked- equal and bilateral Secured at: 21 cm Tube secured with: Tape Dental Injury: Teeth and Oropharynx as per pre-operative assessment

## 2021-11-26 NOTE — Progress Notes (Addendum)
Subjective:  POST OP CHECK s/p incision and drainage of right hand cellulitis versus necrotizing fasciitis.   Patient reports right hand pain as moderate.  Patient was seen with Dr. Steva Ready from infectious disease this evening.  Patient's dressing was changed at the bedside to evaluate for infection progression.  Patient was asleep but easily arousable.  Patient is appropriate response to questions and can participate with examination.  Patient was reported to have seizure activity earlier today and was seen by Dr. Theda Sers from neurology.  An EEG was ordered.  Patient is noted  Objective:   VITALS:   Vitals:   11/26/21 1200 11/26/21 1215 11/26/21 1558 11/26/21 1800  BP:  95/63 104/70   Pulse: 85 81 84   Resp: 19 16 15    Temp:  97.7 F (36.5 C) 98.1 F (36.7 C)   TempSrc:      SpO2: 100% 99% 100%   Weight:    49.9 kg  Height:    5\' 5"  (1.651 m)    PHYSICAL EXAM: Right upper extremity: There is dramatic improvement in swelling and erythema of the right hand and distal forearm.  Dr. Steva Ready took photos for comparison to her ER films.  The improvement is dramatic.  There is no evidence of infection progression. Neurovascular intact Sensation intact distally Intact pulses distally Dorsiflexion/Plantar flexion intact Incision: no drainage No cellulitis present Compartment soft       LABS  Results for orders placed or performed during the hospital encounter of 11/25/21 (from the past 24 hour(s))  CBC with Differential     Status: Abnormal   Collection Time: 11/26/21 12:08 AM  Result Value Ref Range   WBC 7.7 4.0 - 10.5 K/uL   RBC 3.96 3.87 - 5.11 MIL/uL   Hemoglobin 11.6 (L) 12.0 - 15.0 g/dL   HCT 35.9 (L) 36.0 - 46.0 %   MCV 90.7 80.0 - 100.0 fL   MCH 29.3 26.0 - 34.0 pg   MCHC 32.3 30.0 - 36.0 g/dL   RDW 14.6 11.5 - 15.5 %   Platelets 212 150 - 400 K/uL   nRBC 0.0 0.0 - 0.2 %   Neutrophils Relative % 61 %   Neutro Abs 4.7 1.7 - 7.7 K/uL   Lymphocytes Relative  32 %   Lymphs Abs 2.5 0.7 - 4.0 K/uL   Monocytes Relative 7 %   Monocytes Absolute 0.5 0.1 - 1.0 K/uL   Eosinophils Relative 0 %   Eosinophils Absolute 0.0 0.0 - 0.5 K/uL   Basophils Relative 0 %   Basophils Absolute 0.0 0.0 - 0.1 K/uL   Immature Granulocytes 0 %   Abs Immature Granulocytes 0.01 0.00 - 0.07 K/uL  Comprehensive metabolic panel     Status: Abnormal   Collection Time: 11/26/21 12:08 AM  Result Value Ref Range   Sodium 134 (L) 135 - 145 mmol/L   Potassium 3.7 3.5 - 5.1 mmol/L   Chloride 105 98 - 111 mmol/L   CO2 24 22 - 32 mmol/L   Glucose, Bld 118 (H) 70 - 99 mg/dL   BUN 7 6 - 20 mg/dL   Creatinine, Ser 0.68 0.44 - 1.00 mg/dL   Calcium 8.5 (L) 8.9 - 10.3 mg/dL   Total Protein 6.7 6.5 - 8.1 g/dL   Albumin 3.8 3.5 - 5.0 g/dL   AST 15 15 - 41 U/L   ALT 9 0 - 44 U/L   Alkaline Phosphatase 66 38 - 126 U/L   Total Bilirubin 0.4 0.3 -  1.2 mg/dL   GFR, Estimated >60 >60 mL/min   Anion gap 5 5 - 15  Lactic acid, plasma     Status: Abnormal   Collection Time: 11/26/21 12:08 AM  Result Value Ref Range   Lactic Acid, Venous 2.4 (HH) 0.5 - 1.9 mmol/L  Blood culture (routine x 2)     Status: None (Preliminary result)   Collection Time: 11/26/21 12:08 AM   Specimen: BLOOD  Result Value Ref Range   Specimen Description BLOOD LEFT ASSIST CONTROL    Special Requests      BOTTLES DRAWN AEROBIC AND ANAEROBIC Blood Culture results may not be optimal due to an excessive volume of blood received in culture bottles   Culture      NO GROWTH < 12 HOURS Performed at [facility], [address] [place], [place] [address]    Report Status PENDING   Blood culture (routine x 2)     Status: None (Preliminary result)   Collection Time: 11/26/21 12:08 AM   Specimen: BLOOD  Result Value Ref Range   Specimen Description BLOOD LEFT FOREARM    Special Requests      BOTTLES DRAWN AEROBIC AND ANAEROBIC Blood Culture results may not be optimal due to an excessive volume of blood  received in culture bottles   Culture      NO GROWTH < 12 HOURS Performed at [facility], [address] [place], [place] [address]    Report Status PENDING   Resp Panel by RT-PCR (Flu A&B, Covid) Nasopharyngeal Swab     Status: None   Collection Time: 11/26/21 12:08 AM   Specimen: Nasopharyngeal Swab; Nasopharyngeal(NP) swabs in vial transport medium  Result Value Ref Range   SARS Coronavirus 2 by RT PCR NEGATIVE NEGATIVE   Influenza A by PCR NEGATIVE NEGATIVE   Influenza B by PCR NEGATIVE NEGATIVE  Lactic acid, plasma     Status: Abnormal   Collection Time: 11/26/21  4:03 AM  Result Value Ref Range   Lactic Acid, Venous 4.8 (HH) 0.5 - 1.9 mmol/L  Aerobic/Anaerobic Culture w Gram Stain (surgical/deep wound)     Status: None (Preliminary result)   Collection Time: 11/26/21  6:26 AM   Specimen: PATH Other; Wound  Result Value Ref Range   Specimen Description      WOUND Performed at [facility], [address] [place], [place] [address]    Special Requests      RIGHT HAND Performed at [facility], [address] [place], [place] [address]    Gram Stain      NO WBC SEEN NO ORGANISMS SEEN Performed at [facility], [place] [address], [place], [place] [address]    Culture PENDING    Report Status PENDING     CT WRIST RIGHT W CONTRAST  Result Date: 11/26/2021 CLINICAL DATA:  Right hand trauma 1 week ago with increasing lateral-sided hand and wrist swelling since then. EXAM: CT OF THE UPPER RIGHT EXTREMITY WITH CONTRAST TECHNIQUE: Multidetector CT imaging of the upper right forearm, wrist and hand was performed according to the standard protocol following intravenous contrast administration. CONTRAST:  19mL OMNIPAQUE IOHEXOL 300 MG/ML  SOLN COMPARISON:  Right hand series yesterday's the only previous. FINDINGS: Bones/Joint/Cartilage There is normal bone mineralization. The fingers are flexed limiting evaluation of the phalanges. No fractures or  focal bone lesions are observed. There are no erosive lesions or focal periosteal reactions. Joint spaces are maintained. Ligaments Suboptimally assessed by CT.  However, osseous structures are normally aligned. Muscles and Tendons All regional tendons are grossly intact. No intramuscular masses or fluid collections are seen. Soft tissues There is moderate superficial edema along the dorsal/dorsolateral, ventral and lateral aspect of the wrist and proximal hand, ventrally in the distal forearm. There are small scattered air pockets in the soft tissues at the lateral and ventrolateral aspect at the level of the wrist concerning for a gas-forming infection. No localized abscess is seen. IMPRESSION: 1. Soft tissue edema in the distal forearm, wrist and hand, with small scattered air pockets in the soft tissues laterally, findings worrisome for a gas-forming cellulitis. No localized fluid collection is seen in the superficial soft tissues and regional muscles, but underlying myositis is difficult to exclude on CT. Consider follow-up MRI if clinically warranted. There are no findings suspicious for acute osteomyelitis. 2. No evidence of fracture, dislocation or significant degenerative changes, with limited view of the phalanges due to flexion of the fingers. Electronically Signed   By: Telford Nab M.D.   On: 11/26/2021 02:10   DG Hand Complete Right  Result Date: 11/25/2021 CLINICAL DATA:  Pain EXAM: RIGHT HAND - COMPLETE  VIEW COMPARISON:  None. FINDINGS: There is no evidence of fracture or dislocation. There is no evidence of arthropathy or other focal bone abnormality. Soft tissue swelling with associated soft tissue lucencies of the radial aspect of the wrist. IMPRESSION: 1. Soft tissue swelling with associated soft tissue gas, concerning for infection with gas-forming organism. 2. No acute osseous abnormality. Electronically Signed   By: Yetta Glassman M.D.   On: 11/25/2021 18:56    Assessment/Plan: Day  of Surgery   Principal Problem:   Right arm cellulitis  Patient doing well postop.  Patient has no fever or elevated white blood cell count. Gram stain from the OR culture swab shows no white cells or organisms.  Cultures are pending.  Appreciate hospitalist service for admitting patient for management of possible sepsis and ID and neurology consultation.  Antibiotic management per infectious disease.  Continue to elevate the right upper extremity whenever possible.  Continue neurovascular checks.  I will reevaluate tomorrow.     [place] Park , MD 11/26/2021, 6:39 PM

## 2021-11-26 NOTE — Progress Notes (Signed)
CODE SEPSIS - PHARMACY COMMUNICATION  **Broad Spectrum Antibiotics should be administered within 1 hour of Sepsis diagnosis**  Time Code Sepsis Called/Page Received: 0126  Antibiotics Ordered: Cefepime and Clindamycin  Time of 1st antibiotic administration: 0108  Otelia Sergeant, PharmD, MBA 11/26/2021 1:35 AM

## 2021-11-26 NOTE — H&P (Signed)
[place]   PATIENT NAME: Chelsea Stanley    MR#:  [id number]  DATE OF BIRTH:  03-08-1966  DATE OF ADMISSION:  11/25/2021  PRIMARY CARE PHYSICIAN: Romualdo Bolk, FNP   Patient is coming from: Home  REQUESTING/REFERRING PHYSICIAN: [place] Park, MD  CHIEF COMPLAINT:   Chief Complaint  Patient presents with   Arm Injury    HISTORY OF PRESENT ILLNESS:  Chelsea Stanley is a 56 y.o. Caucasian female with medical history significant for multiple medical problems mentioned below including depression, anxiety and seizure disorder, who presented #acute onset of worsening right wrist and hand orthopod call the wrist and ER and the forearm swelling with associated erythema, tenderness and pain, progressing for more than a week.  She admitted to fever and chills.  She denied any chest pain or dyspnea or cough or wheezing or hemoptysis.  No nausea or vomiting or abdominal pain.  For suspicion about necrotizing fasciitis orthopedic consultation was obtained and the patient was taken to the OR for potential debridement.  No abscess or necrotizing fasciitis were found.  The patient had a postoperative tonic-clonic seizure in the PACU with no tongue bites or urinary or stool incontinence.  She was not postictal though after that.  She stated that she had a seizure about once a week.  ED Course: When she initially came to the ER vital signs were within normal.  Labs revealed mild anemia.  Influenza antigens and COVID-19 PCR came back negative.  Lactic acid was 2.4 and later 4.8 and CMP revealed mild hyponatremia 134 and was otherwise unremarkable.  Blood cultures were drawn. EKG as reviewed by me : EKG showed normal sinus rhythm with rate of 65 with prolonged PR interval and poor R wave progression Imaging: Right hand x-ray revealed soft tissue swelling with associated soft tissue gas concerning for infection with gas-forming organism and no acute osseous abnormality. CT of the right wrist  with contrast revealed the following: 1. Soft tissue edema in the distal forearm, wrist and hand, with small scattered air pockets in the soft tissues laterally, findings worrisome for a gas-forming cellulitis. No localized fluid collection is seen in the superficial soft tissues and regional muscles, but underlying myositis is difficult to exclude on CT. Consider follow-up MRI if clinically warranted. There are no findings suspicious for acute osteomyelitis. 2. No evidence of fracture, dislocation or significant degenerative changes, with limited view of the phalanges due to flexion of the fingers.  The patient was given IV cefepime and clindamycin as well as Norco, 2 L bolus of IV lactated ringer a gram of IV meropenem and 4 mg of IV Zofran.  She will be admitted to a medical monitored bed for further evaluation and management. PAST MEDICAL HISTORY:   Past Medical History:  Diagnosis Date   Depression   -Seizure disorder  Neuromuscular disorder (CMS-HCC) 2018  Neck/back associated to prior cervical fusion   NSTEMI (non-ST elevated myocardial infarction) (CMS-HCC)   Ovarian cancer in remission  s/p unilateral salpingoophorectomy in early 58s in [place]   Seizures (CMS-HCC) 12/2014  Epilepsy   Spine pain   Stroke (CMS-HCC) 2016  Stroke possibly  PAST SURGICAL HISTORY:   Past Surgical History:  Procedure Laterality Date   BREAST SURGERY      SOCIAL HISTORY:   Social History   Tobacco Use   Smoking status: Never   Smokeless tobacco: Never  Substance Use Topics   Alcohol use: No    FAMILY HISTORY:  Positive for cancer.  DRUG ALLERGIES:   Allergies  Allergen Reactions   Clindamycin/Lincomycin Anaphylaxis   Peach [Prunus Persica] Anaphylaxis    Any fuzzy fruit   Penicillins Anaphylaxis    Has patient had a PCN reaction causing immediate rash, facial/tongue/throat swelling, SOB or lightheadedness with hypotension: Yes Has patient had a PCN reaction  causing severe rash involving mucus membranes or skin necrosis: Yes Has patient had a PCN reaction that required hospitalization: Yes Has patient had a PCN reaction occurring within the last 10 years: No If all of the above answers are "NO", then may proceed with Cephalosporin use.    Strawberry (Diagnostic) Anaphylaxis   Honey Bee Venom [Bee Venom]    Peanut-Containing Drug Products Swelling   Benadryl [Diphenhydramine] Hives, Anxiety and Palpitations   Latex Rash   Levofloxacin Rash   Sulfa Antibiotics Rash   Vancomycin Rash   Voltaren [Diclofenac] Rash    REVIEW OF SYSTEMS:   ROS As per history of present illness. All pertinent systems were reviewed above. Constitutional, HEENT, cardiovascular, respiratory, GI, GU, musculoskeletal, neuro, psychiatric, endocrine, integumentary and hematologic systems were reviewed and are otherwise negative/unremarkable except for positive findings mentioned above in the HPI.   MEDICATIONS AT HOME:   Prior to Admission medications   Medication Sig Start Date End Date Taking? Authorizing Provider  Butalbital-APAP-Caffeine 50-325-40 MG capsule Take 1 capsule by mouth every 4 (four) hours as needed.    [provider]  clonazePAM (KLONOPIN) 0.5 MG tablet Take 3 tablets by mouth at bedtime.    [provider]  doxycycline (VIBRA-TABS) 100 MG tablet Take 1 tablet (100 mg total) by mouth 2 (two) times daily. 09/13/20   Menshew, Dannielle Karvonen, PA-C  hydrOXYzine (ATARAX/VISTARIL) 50 MG tablet Take 50 mg by mouth daily as needed. 09/22/16   [provider]  Melatonin 5 MG TABS Take 5 mg by mouth at bedtime as needed.    [provider]  mirtazapine (REMERON) 15 MG tablet Take 15 mg by mouth at bedtime.    [provider]  oxymetazoline (AFRIN) 0.05 % nasal spray Place 1 spray into both nostrils 2 (two) times daily as needed for congestion.    [provider]  polyethylene glycol (MIRALAX /  GLYCOLAX) packet Take 17 g by mouth every other day.    [provider]  promethazine (PHENERGAN) 12.5 MG tablet Take 12.5 mg by mouth every 6 (six) hours as needed for nausea or vomiting.    [provider]  sertraline (ZOLOFT) 50 MG tablet Take 50 mg by mouth at bedtime.    [provider]  topiramate (TOPAMAX) 25 MG tablet Take 75 mg by mouth daily.    [provider]  traZODone (DESYREL) 100 MG tablet Take 100 mg by mouth daily. 09/22/16   [provider]      VITAL SIGNS:  Blood pressure 108/61, pulse 77, temperature (!) 97.5 F (36.4 C), resp. rate (!) 22, SpO2 100 %.  PHYSICAL EXAMINATION:  Physical Exam  GENERAL:  56 y.o.-year-old Caucasian female patient lying in the bed with no acute distress.  EYES: Pupils equal, round, reactive to light and accommodation. No scleral icterus. Extraocular muscles intact.  HEENT: Head atraumatic, normocephalic. Oropharynx and nasopharynx clear.  NECK:  Supple, no jugular venous distention. No thyroid enlargement, no tenderness.  LUNGS: Normal breath sounds bilaterally, no wheezing, rales,rhonchi or crepitation. No use of accessory muscles of respiration.  CARDIOVASCULAR: Regular rate and rhythm, S1, S2 normal. No murmurs,  rubs, or gallops.  ABDOMEN: Soft, nondistended, nontender. Bowel sounds present. No organomegaly or mass.  EXTREMITIES: No pedal edema, cyanosis, or clubbing.  NEUROLOGIC: Cranial nerves II through XII are intact. Muscle strength 5/5 in all extremities. Sensation intact. Gait not checked.  PSYCHIATRIC: The patient is alert and oriented x 3.  Normal affect and good eye contact. SKIN: Right wrist and hand erythema, warmth, induration and tenderness extending over the dorsum as well as anterior/palmar side without fluctuation.       LABORATORY PANEL:   CBC Recent Labs  Lab 11/26/21 0008  WBC 7.7  HGB 11.6*  HCT 35.9*  PLT 212    ------------------------------------------------------------------------------------------------------------------  Chemistries  Recent Labs  Lab 11/26/21 0008  NA 134*  K 3.7  CL 105  CO2 24  GLUCOSE 118*  BUN 7  CREATININE 0.68  CALCIUM 8.5*  AST 15  ALT 9  ALKPHOS 66  BILITOT 0.4   ------------------------------------------------------------------------------------------------------------------  Cardiac Enzymes No results for input(s): TROPONINI in the last 168 hours. ------------------------------------------------------------------------------------------------------------------  RADIOLOGY:  CT WRIST RIGHT W CONTRAST  Result Date: 11/26/2021 CLINICAL DATA:  Right hand trauma 1 week ago with increasing lateral-sided hand and wrist swelling since then. EXAM: CT OF THE UPPER RIGHT EXTREMITY WITH CONTRAST TECHNIQUE: Multidetector CT imaging of the upper right forearm, wrist and hand was performed according to the standard protocol following intravenous contrast administration. CONTRAST:  76mL OMNIPAQUE IOHEXOL 300 MG/ML  SOLN COMPARISON:  Right hand series yesterday's the only previous. FINDINGS: Bones/Joint/Cartilage There is normal bone mineralization. The fingers are flexed limiting evaluation of the phalanges. No fractures or focal bone lesions are observed. There are no erosive lesions or focal periosteal reactions. Joint spaces are maintained. Ligaments Suboptimally assessed by CT. However, osseous structures are normally aligned. Muscles and Tendons All regional tendons are grossly intact. No intramuscular masses or fluid collections are seen. Soft tissues There is moderate superficial edema along the dorsal/dorsolateral, ventral and lateral aspect of the wrist and proximal hand, ventrally in the distal forearm. There are small scattered air pockets in the soft tissues at the lateral and ventrolateral aspect at the level of the wrist concerning for a gas-forming infection. No  localized abscess is seen. IMPRESSION: 1. Soft tissue edema in the distal forearm, wrist and hand, with small scattered air pockets in the soft tissues laterally, findings worrisome for a gas-forming cellulitis. No localized fluid collection is seen in the superficial soft tissues and regional muscles, but underlying myositis is difficult to exclude on CT. Consider follow-up MRI if clinically warranted. There are no findings suspicious for acute osteomyelitis. 2. No evidence of fracture, dislocation or significant degenerative changes, with limited view of the phalanges due to flexion of the fingers. Electronically Signed   By: Telford Nab M.D.   On: 11/26/2021 02:10   DG Hand Complete Right  Result Date: 11/25/2021 CLINICAL DATA:  Pain EXAM: RIGHT HAND - COMPLETE  VIEW COMPARISON:  None. FINDINGS: There is no evidence of fracture or dislocation. There is no evidence of arthropathy or other focal bone abnormality. Soft tissue swelling with associated soft tissue lucencies of the radial aspect of the wrist. IMPRESSION: 1. Soft tissue swelling with associated soft tissue gas, concerning for infection with gas-forming organism. 2. No acute osseous abnormality. Electronically Signed   By: Yetta Glassman M.D.   On: 11/25/2021 18:56      IMPRESSION AND PLAN:  Principal Problem:   Right arm cellulitis  1.  Severe nonpurulent right wrist and hand cellulitis. -  The patient will be admitted to a medical monitored bed. - We will continue antibiotic therapy with IV cefepime and clindamycin. - The patient received clindamycin without any reaction in the ER and therefore it will be removed from her allergies. - Pain management will be provided. - Warm compresses will be applied. - ID consult was obtained by Dr. Tama High.  2.  Seizure disorder. - The patient had seizure-like activity or pseudoseizure in the ER. - She will be placed on as needed IV Ativan. - Seizure precautions will be applied. - EEG  and neurology consult to be obtained. - I notified Dr. Corine Shelter about the patient.  3.  Anxiety and major depression. - We will continue her Zoloft, Topamax, Seroquel and Klonopin.  DVT prophylaxis: Lovenox. Code Status: full code. Family Communication:  The plan of care was discussed in details with the patient (and family). I answered all questions. The patient agreed to proceed with the above mentioned plan. Further management will depend upon hospital course. Disposition Plan: Back to previous home environment Consults called: Orthopedic and ID consults. All the records are reviewed and case discussed with ED provider.  Status is: Inpatient   At the time of the admission, it appears that the appropriate admission status for this patient is inpatient.  This is judged to be reasonable and necessary in order to provide the required intensity of service to ensure the patient's safety given the presenting symptoms, physical exam findings and initial radiographic and laboratory data in the context of comorbid conditions.  The patient requires inpatient status due to high intensity of service, high risk of further deterioration and high frequency of surveillance required.  I certify that at the time of admission, it is my clinical judgment that the patient will require inpatient hospital care extending more than 2 midnights.                            Dispo: The patient is from: Home              Anticipated d/c is to: Home              Patient currently is not medically stable to d/c.              Difficult to place patient: No    Christel Mormon M.D on 11/26/2021 at 8:44 AM  [facility]   From 7 PM-7 AM, contact night-coverage [email or web address]  CC: Primary care physician; Romualdo Bolk, FNP

## 2021-11-26 NOTE — Op Note (Signed)
11/26/2021  8:07 AM  PATIENT:  Chelsea Stanley    PRE-OPERATIVE DIAGNOSIS:  NECROTIZING FASCIITIS RIGHT HAND AND WRIST  POST-OPERATIVE DIAGNOSIS:  CELLULITIS RIGHT HAND, WRIST AND FOREARM  PROCEDURE:  IRRIGATION AND DEBRIDEMENT RIGHT HAND, WRIST AND DISTAL FOREARM  SURGEON:  Juanell Fairly, MD  ANESTHESIA:   General  PREOPERATIVE INDICATIONS:  Chelsea Stanley is a  56 y.o. female who presented to the emergency room yesterday evening with increased pain and redness in the right hand.  Described the laceration to the right hand from broken glass approximately a week ago.  She cleaned a fish tank with her right hand a few days later.  Patient has developed increasing pain swelling and redness with significant progression over the past 24 to 48 hours.  Patient had an x-ray and CT scan in the ER which demonstrated possible subcutaneous air consistent with necrotizing fasciitis.  Patient was therefore taken emergently to the operating room for irrigation and debridement after unsuccessful attempts were made to transfer the patient to a hand specialist at [facility] in [facility].  I discussed the details of the surgery as well as the risks and benefits with the patient.  She understood that she had a severe infection requiring emergent surgery.  Patient understood the risks include but are not limited to spreading infection, recurrent infection, bleeding , nerve or blood vessel injury, joint stiffness or loss of motion, persistent pain, weakness or instability, the need for further surgery and possible amputation of her right upper extremity and death.  Patient understood these risks and wished to proceed.   OPERATIVE FINDINGS: No purulent fluid, abscess or necrotic tissue was encountered.  All tissue was viable during the case.  OPERATIVE PROCEDURE:  Patient was brought to the operating room.  She was placed supine on the operative table.  She underwent general anesthesia.   Patient's right arm was placed on a hand table.  A tourniquet was placed to the right upper arm.  The patient was prepped and draped in sterile fashion.  A timeout was performed to verify the patient's name, date of birth and medical record number, correct site of surgery and correct procedure to be performed.  Once all in attendance were in agreement the case began.  The tourniquet was inflated to 250 mm for a total of 61 minutes.  An Esmarch was not used prior to tourniquet inflation.  Patient had already received clindamycin, cefepime and meropenem in the ER.  A volar incision was made over the thenar eminence extending from the original puncture wound on the proximal phalanx to the proximal forearm.  The incision was "Z'd" over the wrist crease.  All bleeding vessels were cauterized during exposure.  Care was taken to avoid injury to neurovascular structures in the wrist and forearm.  The thenar muscles were exposed and demonstrated no pyomyositis.  No purulent fluid was encountered around the thenar eminence, within the webspace between the thumb and index finger and in the distal forearm.  The forearm fascia was incised over the FCR tendon.  The floor of the tendon sheath was also opened to allow exposure of the underlying pronator quadratus muscle.  The fascia to the pronator quadratus muscle was opened.  There was no abscess or purulent fluid encountered.  There was no muscle necrosis encountered.  A culture swab was taken of the volar wound.  The volar wound was copiously irrigated including in the webspace, between the thenar muscles and in the forearm down to the  pronator quadratus muscle.  This was irrigated with GU infused saline x1500 cc.  The attention was then turned to the dorsal hand which also had extensive erythema and swelling preop.  A longitudinal incision was made in line with the index metacarpal and extended to the wrist.  The dissection extended to the fourth metacarpal.  Care was  taken to avoid injury to subcutaneous neurovascular structures.  Dissection also extended into the webspace between the thumb and index finger. No abscess purulent fluid or necrosis was encountered during the dorsal dissection.  The dorsal wound was copiously irrigated with 1500 cc of GU infused saline.    The tourniquet was then deflated.  All bleeding vessels were cauterized.  The skin was loosely approximated with 4-0 nylon and a dry sterile dressing.  Patient was awoken and brought to the PACU in stable condition.  I scrubbed and present for the entire case.  All sharp, sponge and instrument counts were correct at the conclusion of the case.

## 2021-11-26 NOTE — Transfer of Care (Signed)
Immediate Anesthesia Transfer of Care Note  Patient: Chelsea Stanley  Procedure(s) Performed: INCISION AND DEBRIDEMENT RIGHT THUMB, WRIST, AND FOREARM (Right: Hand)  Patient Location: PACU  Anesthesia Type:General  Level of Consciousness: awake, pateint uncooperative and confused  Airway & Oxygen Therapy: Patient Spontanous Breathing  Post-op Assessment: Report given to RN and Post -op Vital signs reviewed and stable  Post vital signs: stable  Last Vitals:  Vitals Value Taken Time  BP 95/55 11/26/21 0730  Temp 36.4 C 11/26/21 0723  Pulse 64 11/26/21 0734  Resp 12 11/26/21 0734  SpO2 95 % 11/26/21 0734  Vitals shown include unvalidated device data.  Last Pain:  Vitals:   11/26/21 0429  TempSrc:   PainSc: 10-Worst pain ever         Complications: No notable events documented.

## 2021-11-26 NOTE — Progress Notes (Signed)
Informed Dr. Wynetta Emery pt appeared to have seizure activity lying in bed.  Pt body started shaking for approximately 30 seconds with jaws clenched and eyes closed.   Pt not responding during episode to voice. Pt stated after episode "I;m in pain."  Pt was comforted by nursing staff.   Dr. Wynetta Emery order 0.5 mg ativan.

## 2021-11-26 NOTE — ED Notes (Signed)
[facility] returned call at capacity

## 2021-11-26 NOTE — ED Notes (Signed)
Spoke with [place] @ [facility] for possible transfer

## 2021-11-26 NOTE — Sepsis Progress Note (Signed)
Pt remains in OR at the end of Code Sepsis.

## 2021-11-26 NOTE — Progress Notes (Signed)
PHARMACY -  BRIEF ANTIBIOTIC NOTE   Pharmacy has received consult(s) for meropenem from an ED provider.  The patient's profile has been reviewed for ht/wt/allergies/indication/available labs.    One time order(s) placed for meropenem 1 gm.  Further antibiotics/pharmacy consults should be ordered by admitting physician if indicated.                       Thank you, Lauretta Sallas,Natha S 11/26/2021  3:51 AM

## 2021-11-27 DIAGNOSIS — A419 Sepsis, unspecified organism: Secondary | ICD-10-CM | POA: Diagnosis not present

## 2021-11-27 DIAGNOSIS — L03113 Cellulitis of right upper limb: Secondary | ICD-10-CM | POA: Diagnosis not present

## 2021-11-27 DIAGNOSIS — I96 Gangrene, not elsewhere classified: Secondary | ICD-10-CM | POA: Diagnosis not present

## 2021-11-27 LAB — BASIC METABOLIC PANEL
Anion gap: 2 — ABNORMAL LOW (ref 5–15)
BUN: <5 mg/dL — ABNORMAL LOW (ref 6–20)
CO2: 28 mmol/L (ref 22–32)
Calcium: 8.1 mg/dL — ABNORMAL LOW (ref 8.9–10.3)
Chloride: 109 mmol/L (ref 98–111)
Creatinine, Ser: 0.54 mg/dL (ref 0.44–1.00)
GFR, Estimated: >60 mL/min (ref >60)
Glucose, Bld: 104 mg/dL — ABNORMAL HIGH (ref 70–99)
Potassium: 4.2 mmol/L (ref 3.5–5.1)
Sodium: 139 mmol/L (ref 135–145)

## 2021-11-27 LAB — CBC
HCT: 30.6 % — ABNORMAL LOW (ref 36.0–46.0)
Hemoglobin: 10.1 g/dL — ABNORMAL LOW (ref 12.0–15.0)
MCH: 29.1 pg (ref 26.0–34.0)
MCHC: 33 g/dL (ref 30.0–36.0)
MCV: 88.2 fL (ref 80.0–100.0)
Platelets: 158 10*3/uL (ref 150–400)
RBC: 3.47 MIL/uL — ABNORMAL LOW (ref 3.87–5.11)
RDW: 14.8 % (ref 11.5–15.5)
WBC: 5 10*3/uL (ref 4.0–10.5)
nRBC: 0 % (ref 0.0–0.2)

## 2021-11-27 LAB — HIV ANTIBODY (ROUTINE TESTING W REFLEX): HIV Screen 4th Generation wRfx: NONREACTIVE

## 2021-11-27 MED ORDER — SERTRALINE HCL 50 MG PO TABS
50.0000 mg | ORAL_TABLET | Freq: Every day | ORAL | Status: DC
  Administered 2021-11-27: 50 mg via ORAL
  Filled 2021-11-27: qty 1

## 2021-11-27 MED ORDER — MIRTAZAPINE 15 MG PO TABS
15.0000 mg | ORAL_TABLET | Freq: Every day | ORAL | Status: DC
  Administered 2021-11-27: 15 mg via ORAL
  Filled 2021-11-27: qty 1

## 2021-11-27 MED ORDER — CLONAZEPAM 0.5 MG PO TABS
1.5000 mg | ORAL_TABLET | Freq: Every day | ORAL | Status: DC
  Administered 2021-11-27: 1.5 mg via ORAL
  Filled 2021-11-27: qty 3

## 2021-11-27 MED ORDER — TOPIRAMATE 25 MG PO TABS
75.0000 mg | ORAL_TABLET | Freq: Every day | ORAL | Status: DC
  Administered 2021-11-27 – 2021-11-28 (×2): 75 mg via ORAL
  Filled 2021-11-27 (×2): qty 3

## 2021-11-27 NOTE — Progress Notes (Signed)
Subjective:  POD #1 s/p I&D of right hand for cellulitis versus necrotizing fasciitis.  Patient sitting up in bed eating lunch today.  Patient complains of nausea..   Patient reports right hand pain as moderate to severe.    Objective:   VITALS:   Vitals:   11/26/21 1800 11/26/21 2048 11/27/21 0218 11/27/21 0808  BP:  107/68 100/60 (!) 101/57  Pulse:  80 74 74  Resp:  16 15 16   Temp:  98.6 F (37 C) 98.3 F (36.8 C) 98.3 F (36.8 C)  TempSrc:      SpO2:  100% 100% 99%  Weight: 49.9 kg     Height: 5\' 5"  (1.651 m)       PHYSICAL EXAM: Right hand/wrist:   Patient's dressings are clean dry and intact.  I have personally changed the patient's dressings today.  There is no significant erythema or swelling today.  Her incisions are clean dry and intact without active drainage.  Patient has intact sensation to light touch in the index middle, ring and small fingers.  She has slightly diminished sensation to light touch over the dorsum of her hand extending into her thumb.  Patient can flex and extend all 5 digits but range of motion is limited due to pain.  Patient has intact sensation on the palmar side of her right hand as well as her forearm.  Her digits and hand are well-perfused.   LABS  Results for orders placed or performed during the hospital encounter of 11/25/21 (from the past 24 hour(s))  HIV Antibody (routine testing w rflx)     Status: None   Collection Time: 11/27/21  4:42 AM  Result Value Ref Range   HIV Screen 4th Generation wRfx Non Reactive Non Reactive  Basic metabolic panel     Status: Abnormal   Collection Time: 11/27/21  4:42 AM  Result Value Ref Range   Sodium 139 135 - 145 mmol/L   Potassium 4.2 3.5 - 5.1 mmol/L   Chloride 109 98 - 111 mmol/L   CO2 28 22 - 32 mmol/L   Glucose, Bld 104 (H) 70 - 99 mg/dL   BUN <5 (L) 6 - 20 mg/dL   Creatinine, Ser 0.54 0.44 - 1.00 mg/dL   Calcium 8.1 (L) 8.9 - 10.3 mg/dL   GFR, Estimated >60 >60 mL/min   Anion gap 2 (L) 5  - 15  CBC     Status: Abnormal   Collection Time: 11/27/21  4:42 AM  Result Value Ref Range   WBC 5.0 4.0 - 10.5 K/uL   RBC 3.47 (L) 3.87 - 5.11 MIL/uL   Hemoglobin 10.1 (L) 12.0 - 15.0 g/dL   HCT 30.6 (L) 36.0 - 46.0 %   MCV 88.2 80.0 - 100.0 fL   MCH 29.1 26.0 - 34.0 pg   MCHC 33.0 30.0 - 36.0 g/dL   RDW 14.8 11.5 - 15.5 %   Platelets 158 150 - 400 K/uL   nRBC 0.0 0.0 - 0.2 %    CT WRIST RIGHT W CONTRAST  Result Date: 11/26/2021 CLINICAL DATA:  Right hand trauma 1 week ago with increasing lateral-sided hand and wrist swelling since then. EXAM: CT OF THE UPPER RIGHT EXTREMITY WITH CONTRAST TECHNIQUE: Multidetector CT imaging of the upper right forearm, wrist and hand was performed according to the standard protocol following intravenous contrast administration. CONTRAST:  38mL OMNIPAQUE IOHEXOL 300 MG/ML  SOLN COMPARISON:  Right hand series yesterday's the only previous. FINDINGS: Bones/Joint/Cartilage There is normal  bone mineralization. The fingers are flexed limiting evaluation of the phalanges. No fractures or focal bone lesions are observed. There are no erosive lesions or focal periosteal reactions. Joint spaces are maintained. Ligaments Suboptimally assessed by CT. However, osseous structures are normally aligned. Muscles and Tendons All regional tendons are grossly intact. No intramuscular masses or fluid collections are seen. Soft tissues There is moderate superficial edema along the dorsal/dorsolateral, ventral and lateral aspect of the wrist and proximal hand, ventrally in the distal forearm. There are small scattered air pockets in the soft tissues at the lateral and ventrolateral aspect at the level of the wrist concerning for a gas-forming infection. No localized abscess is seen. IMPRESSION: 1. Soft tissue edema in the distal forearm, wrist and hand, with small scattered air pockets in the soft tissues laterally, findings worrisome for a gas-forming cellulitis. No localized fluid  collection is seen in the superficial soft tissues and regional muscles, but underlying myositis is difficult to exclude on CT. Consider follow-up MRI if clinically warranted. There are no findings suspicious for acute osteomyelitis. 2. No evidence of fracture, dislocation or significant degenerative changes, with limited view of the phalanges due to flexion of the fingers. Electronically Signed   By: Telford Nab M.D.   On: 11/26/2021 02:10   DG Hand Complete Right  Result Date: 11/25/2021 CLINICAL DATA:  Pain EXAM: RIGHT HAND - COMPLETE  VIEW COMPARISON:  None. FINDINGS: There is no evidence of fracture or dislocation. There is no evidence of arthropathy or other focal bone abnormality. Soft tissue swelling with associated soft tissue lucencies of the radial aspect of the wrist. IMPRESSION: 1. Soft tissue swelling with associated soft tissue gas, concerning for infection with gas-forming organism. 2. No acute osseous abnormality. Electronically Signed   By: Yetta Glassman M.D.   On: 11/25/2021 18:56    Assessment/Plan: 1 Day Post-Op   Principal Problem:   Right arm cellulitis  Patient doing well postop.  No signs of necrotizing fasciitis or progressing infection.  Patient has had dramatic improvement in swelling and erythema in her right hand since surgery.  Continue to elevate the right upper extremity.  Patient was encouraged to move her fingers to reduce swelling.  Continue IV antibiotics per infectious disease.    Thornton Park , MD 11/27/2021, 1:34 PM

## 2021-11-27 NOTE — Progress Notes (Signed)
PROGRESS NOTE  Chelsea BeathSilvia Froelich WUJ:[id number] DOB: Feb 22, 1966 DOA: 11/25/2021 PCP: Olena LeatherwoodFarrug, [name] D, FNP  HPI/Recap of past 24 hours: Chelsea Stanley is a 56 y.o. Caucasian female with medical history significant for depression, anxiety and seizure disorder, who presented acute onset of worsening right wrist and hand and forearm swelling with associated erythema, tenderness and pain, progressing for more than a week.  She admitted to fever and chills. For suspicion about necrotizing fasciitis, orthopedic consultation was obtained and the patient was taken to the OR for potential debridement.  No abscess or necrotizing fasciitis were found.  The patient had a postoperative tonic-clonic seizure in the PACU with no tongue bites or urinary or stool incontinence.  She was not postictal. Pt stated that she had a seizure about once a week.  Patient admitted for further management.    Today, patient reports feeling okay, reports some nausea/vomiting and relates this to clindamycin.  Denies any other new complaints, no further seizure episode noted.  Assessment/Plan: Principal Problem:   Right arm cellulitis  Severe nonpurulent right wrist and hand cellulitis S/p debridement on 11/26/2021 by orthopedics Currently afebrile, with no leukocytosis BC X2 NGTD Aerobic/anaerobic culture no growth so far ID on board, continue meropenem, clindamycin Orthopedics on board Pain management  History of seizure disorder Had seizure-like activity or pseudoseizure in PACU EEG done, pending results Neurology consulted, appreciate recs, IV Ativan as needed Continue home topiramate Seizure precautions  Anxiety and major depression Continue Zoloft, Klonopin, Remeron     Estimated body mass index is 18.3 kg/m as calculated from the following:   Height as of this encounter: 5\' 5"  (1.651 m).   Weight as of this encounter: 49.9 kg.     Code Status: Full  Family Communication: None at  bedside  Disposition Plan: Status is: Inpatient  Remains inpatient appropriate because: Level of care     Consultants: Orthopedics ID  Procedures: Debridement by orthopedics on 11/26/2021  Antimicrobials: Meropenem Clindamycin  DVT prophylaxis: Lovenox   Objective: Vitals:   11/26/21 2048 11/27/21 0218 11/27/21 0808 11/27/21 1344  BP: 107/68 100/60 (!) 101/57 100/62  Pulse: 80 74 74 72  Resp: 16 15 16 15   Temp: 98.6 F (37 C) 98.3 F (36.8 C) 98.3 F (36.8 C) 98.2 F (36.8 C)  TempSrc:      SpO2: 100% 100% 99% 98%  Weight:      Height:        Intake/Output Summary (Last 24 hours) at 11/27/2021 1522 Last data filed at 11/27/2021 0548 Gross per 24 hour  Intake 1622.56 ml  Output 1003 ml  Net 619.56 ml   Filed Weights   11/26/21 1800  Weight: 49.9 kg    Exam: General: NAD  Cardiovascular: S1, S2 present Respiratory: CTAB Abdomen: Soft, nontender, nondistended, bowel sounds present Musculoskeletal: No bilateral pedal edema noted, noted dressing on right upper extremity C/D/I Skin: As above Psychiatry: Normal mood     Data Reviewed: CBC: Recent Labs  Lab 11/26/21 0008 11/27/21 0442  WBC 7.7 5.0  NEUTROABS 4.7  --   HGB 11.6* 10.1*  HCT 35.9* 30.6*  MCV 90.7 88.2  PLT 212 158   Basic Metabolic Panel: Recent Labs  Lab 11/26/21 0008 11/27/21 0442  NA 134* 139  K 3.7 4.2  CL 105 109  CO2 24 28  GLUCOSE 118* 104*  BUN 7 <5*  CREATININE 0.68 0.54  CALCIUM 8.5* 8.1*   GFR: Estimated Creatinine Clearance: 64.1 mL/min (by C-G formula based on SCr  of 0.54 mg/dL). Liver Function Tests: Recent Labs  Lab 11/26/21 0008  AST 15  ALT 9  ALKPHOS 66  BILITOT 0.4  PROT 6.7  ALBUMIN 3.8   No results for input(s): LIPASE, AMYLASE in the last 168 hours. No results for input(s): AMMONIA in the last 168 hours. Coagulation Profile: No results for input(s): INR, PROTIME in the last 168 hours. Cardiac Enzymes: Recent Labs  Lab 11/26/21 0008   CKTOTAL 37*   BNP (last 3 results) No results for input(s): PROBNP in the last 8760 hours. HbA1C: No results for input(s): HGBA1C in the last 72 hours. CBG: No results for input(s): GLUCAP in the last 168 hours. Lipid Profile: No results for input(s): CHOL, HDL, LDLCALC, TRIG, CHOLHDL, LDLDIRECT in the last 72 hours. Thyroid Function Tests: No results for input(s): TSH, T4TOTAL, FREET4, T3FREE, THYROIDAB in the last 72 hours. Anemia Panel: No results for input(s): VITAMINB12, FOLATE, FERRITIN, TIBC, IRON, RETICCTPCT in the last 72 hours. Urine analysis:    Component Value Date/Time   COLORURINE STRAW (A) 07/16/2018 0923   APPEARANCEUR CLEAR (A) 07/16/2018 0923   LABSPEC 1.004 (L) 07/16/2018 0923   PHURINE 5.0 07/16/2018 0923   GLUCOSEU NEGATIVE 07/16/2018 0923   HGBUR NEGATIVE 07/16/2018 0923   BILIRUBINUR NEGATIVE 07/16/2018 0923   KETONESUR NEGATIVE 07/16/2018 0923   PROTEINUR NEGATIVE 07/16/2018 0923   NITRITE NEGATIVE 07/16/2018 0923   LEUKOCYTESUR NEGATIVE 07/16/2018 0923   Sepsis Labs: @LABRCNTIP (procalcitonin:4,lacticidven:4)  ) Recent Results (from the past 240 hour(s))  Blood culture (routine x 2)     Status: None (Preliminary result)   Collection Time: 11/26/21 12:08 AM   Specimen: BLOOD  Result Value Ref Range Status   Specimen Description BLOOD LEFT ASSIST CONTROL  Final   Special Requests   Final    BOTTLES DRAWN AEROBIC AND ANAEROBIC Blood Culture results may not be optimal due to an excessive volume of blood received in culture bottles   Culture   Final    NO GROWTH 1 DAY Performed at [facility], [address] [place], [place]    Report Status PENDING  Incomplete  Blood culture (routine x 2)     Status: None (Preliminary result)   Collection Time: 11/26/21 12:08 AM   Specimen: BLOOD  Result Value Ref Range Status   Specimen Description BLOOD LEFT FOREARM  Final   Special Requests   Final    BOTTLES DRAWN AEROBIC AND  ANAEROBIC Blood Culture results may not be optimal due to an excessive volume of blood received in culture bottles   Culture   Final    NO GROWTH 1 DAY Performed at [facility], [address], [place], [place]    Report Status PENDING  Incomplete  Resp Panel by RT-PCR (Flu A&B, Covid) Nasopharyngeal Swab     Status: None   Collection Time: 11/26/21 12:08 AM   Specimen: Nasopharyngeal Swab; Nasopharyngeal(NP) swabs in vial transport medium  Result Value Ref Range Status   SARS Coronavirus 2 by RT PCR NEGATIVE NEGATIVE Final    Comment: (NOTE) SARS-CoV-2 target nucleic acids are NOT DETECTED.  The SARS-CoV-2 RNA is generally detectable in upper respiratory specimens during the acute phase of infection. The lowest concentration of SARS-CoV-2 viral copies this assay can detect is 138 copies/mL. A negative result does not preclude SARS-Cov-2 infection and should not be used as the sole basis for treatment or other patient management decisions. A negative result may occur with  improper specimen collection/handling,  submission of specimen other than nasopharyngeal swab, presence of viral mutation(s) within the areas targeted by this assay, and inadequate number of viral copies(<138 copies/mL). A negative result must be combined with clinical observations, patient history, and epidemiological information. The expected result is Negative.  Fact Sheet for Patients:  BloggerCourse.com  Fact Sheet for Healthcare Providers:  SeriousBroker.it  This test is no t yet approved or cleared by the [place] FDA and  has been authorized for detection and/or diagnosis of SARS-CoV-2 by FDA under an Emergency Use Authorization (EUA). This EUA will remain  in effect (meaning this test can be used) for the duration of the COVID-19 declaration under Section 564(b)(1) of the Act, 21 U.S.C.section 360bbb-3(b)(1), unless the  authorization is terminated  or revoked sooner.       Influenza A by PCR NEGATIVE NEGATIVE Final   Influenza B by PCR NEGATIVE NEGATIVE Final    Comment: (NOTE) The Xpert Xpress SARS-CoV-2/FLU/RSV plus assay is intended as an aid in the diagnosis of influenza from Nasopharyngeal swab specimens and should not be used as a sole basis for treatment. Nasal washings and aspirates are unacceptable for Xpert Xpress SARS-CoV-2/FLU/RSV testing.  Fact Sheet for Patients: BloggerCourse.com  Fact Sheet for Healthcare Providers: SeriousBroker.it  This test is not yet approved or cleared by the [place] FDA and has been authorized for detection and/or diagnosis of SARS-CoV-2 by FDA under an Emergency Use Authorization (EUA). This EUA will remain in effect (meaning this test can be used) for the duration of the COVID-19 declaration under Section 564(b)(1) of the Act, 21 U.S.C. section 360bbb-3(b)(1), unless the authorization is terminated or revoked.  Performed at [facility], [address] [place], [place] [address]   Aerobic/Anaerobic Culture w Gram Stain (surgical/deep wound)     Status: None (Preliminary result)   Collection Time: 11/26/21  6:26 AM   Specimen: PATH Other; Wound  Result Value Ref Range Status   Specimen Description   Final    WOUND Performed at [facility], [address] [place], [place] [address]    Special Requests   Final    RIGHT HAND Performed at [facility], [address] [place], [place] [address]    Gram Stain NO WBC SEEN NO ORGANISMS SEEN   Final   Culture   Final    NO GROWTH < 24 HOURS Performed at [facility], [address], [place], [place] [address]    Report Status PENDING  Incomplete      Studies: No results found.  Scheduled Meds:  enoxaparin (LOVENOX) injection  40 mg Subcutaneous Q24H    Continuous Infusions:  sodium chloride 125  mL/hr at 11/27/21 1326   clindamycin (CLEOCIN) IV 600 mg (11/27/21 1328)   meropenem (MERREM) IV 1 g (11/27/21 1415)     LOS: 1 day     Briant Cedar, MD [facility]  If 7PM-7AM, please contact night-coverage [email or web address] 11/27/2021, 3:22 PM

## 2021-11-27 NOTE — Progress Notes (Signed)
ID Pt doing very well No fever No nausea no vomiting Pain rt arm better  O/e awake and alert No distress Patient Vitals for the past 24 hrs:  BP Temp Pulse Resp SpO2 Height Weight  11/27/21 1344 100/62 98.2 F (36.8 C) 72 15 98 % -- --  11/27/21 0808 (!) 101/57 98.3 F (36.8 C) 74 16 99 % -- --  11/27/21 0218 100/60 98.3 F (36.8 C) 74 15 100 % -- --  11/26/21 2048 107/68 98.6 F (37 C) 80 16 100 % -- --  11/26/21 1800 -- -- -- -- -- 5\' 5"  (1.651 m) 49.9 kg  11/26/21 1558 104/70 98.1 F (36.7 C) 84 15 100 % -- --   Chest CTA  HS s1s2 Abd soft Examined the arm last evening with Dr.Krasinski Today I did not remove the bandage Erythema and edema much improved Incision site covered with steristrips      Labs CBC Latest Ref Rng & Units 11/27/2021 11/26/2021 09/13/2020  WBC 4.0 - 10.5 K/uL 5.0 7.7 6.1  Hemoglobin 12.0 - 15.0 g/dL 10.1(L) 11.6(L) 12.6  Hematocrit 36.0 - 46.0 % 30.6(L) 35.9(L) 39.9  Platelets 150 - 400 K/uL 158 212 288     CMP Latest Ref Rng & Units 11/27/2021 11/26/2021 09/13/2020  Glucose 70 - 99 mg/dL 104(H) 118(H) 85  BUN 6 - 20 mg/dL <5(L) 7 9  Creatinine 0.44 - 1.00 mg/dL 0.54 0.68 0.67  Sodium 135 - 145 mmol/L 139 134(L) 139  Potassium 3.5 - 5.1 mmol/L 4.2 3.7 3.6  Chloride 98 - 111 mmol/L 109 105 107  CO2 22 - 32 mmol/L 28 24 25   Calcium 8.9 - 10.3 mg/dL 8.1(L) 8.5(L) 8.9  Total Protein 6.5 - 8.1 g/dL - 6.7 7.0  Total Bilirubin 0.3 - 1.2 mg/dL - 0.4 0.6  Alkaline Phos 38 - 126 U/L - 66 84  AST 15 - 41 U/L - 15 21  ALT 0 - 44 U/L - 9 16     Micro 11/26/21 BC- NG Surgical culture wound- no growth  Impression/recommendation RT hand and foream infection following a glass cult and then cleaning a fish tank- cellulitis with necrosis in the soft tissue Gas in tissue Common organisms are gram negatives including aeromonas, pseudomonas, kleb,  and other common organisms like staph, strep, anerobes Was taken for surgery- no purulence noted The arm  was looking much better after surgery On meropenem and clinda- will continue till cultures are available   Mycobacterium marinum is a concern but usually does not present acutely- l asked lab to do AFB culture     Once culture results available  can de-escalate antibiotics No leucocytosis, no fever, normal creatinine, and normal LFTS  Seizures yesterday Will avoid cefepime EEG done     Multiple antibiotic allergies noted- vanco/PCN/levaquin Ignacia Bayley and clinda- but she is tolerating clindamycin- says she could take others while in the hospital   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

## 2021-11-27 NOTE — Anesthesia Postprocedure Evaluation (Signed)
Anesthesia Post Note  Patient: Proofreader  Procedure(s) Performed: INCISION AND DEBRIDEMENT RIGHT THUMB, WRIST, AND FOREARM (Right: Hand)  Patient location during evaluation: PACU Anesthesia Type: General Level of consciousness: awake and alert Pain management: pain level controlled Vital Signs Assessment: post-procedure vital signs reviewed and stable Respiratory status: spontaneous breathing, nonlabored ventilation and respiratory function stable Cardiovascular status: blood pressure returned to baseline and stable Postop Assessment: no apparent nausea or vomiting Anesthetic complications: no Comments: Pt intermittently agitated and uncooperative for short duration and becomes alert and oriented immediately. She has seen a neurologist for seizure like activity and is treated for anxiety and pseudoseizures. The primary provider this hospital stay was made aware.    No notable events documented.   Last Vitals:  Vitals:   11/26/21 2048 11/27/21 0218  BP: 107/68 100/60  Pulse: 80 74  Resp: 16 15  Temp: 37 C 36.8 C  SpO2: 100% 100%    Last Pain:  Vitals:   11/27/21 0137  TempSrc:   PainSc: Asleep                 [place] Ouch

## 2021-11-28 ENCOUNTER — Other Ambulatory Visit (HOSPITAL_COMMUNITY): Payer: Self-pay

## 2021-11-28 DIAGNOSIS — R6521 Severe sepsis with septic shock: Secondary | ICD-10-CM

## 2021-11-28 DIAGNOSIS — F39 Unspecified mood [affective] disorder: Secondary | ICD-10-CM | POA: Diagnosis present

## 2021-11-28 DIAGNOSIS — A419 Sepsis, unspecified organism: Secondary | ICD-10-CM | POA: Diagnosis not present

## 2021-11-28 DIAGNOSIS — G40909 Epilepsy, unspecified, not intractable, without status epilepticus: Secondary | ICD-10-CM

## 2021-11-28 DIAGNOSIS — L03113 Cellulitis of right upper limb: Secondary | ICD-10-CM | POA: Diagnosis not present

## 2021-11-28 LAB — BASIC METABOLIC PANEL
Anion gap: 3 — ABNORMAL LOW (ref 5–15)
BUN: 7 mg/dL (ref 6–20)
CO2: 25 mmol/L (ref 22–32)
Calcium: 8.2 mg/dL — ABNORMAL LOW (ref 8.9–10.3)
Chloride: 114 mmol/L — ABNORMAL HIGH (ref 98–111)
Creatinine, Ser: 0.55 mg/dL (ref 0.44–1.00)
GFR, Estimated: >60 mL/min (ref >60)
Glucose, Bld: 105 mg/dL — ABNORMAL HIGH (ref 70–99)
Potassium: 4.5 mmol/L (ref 3.5–5.1)
Sodium: 142 mmol/L (ref 135–145)

## 2021-11-28 LAB — CBC WITH DIFFERENTIAL/PLATELET
Abs Immature Granulocytes: 0.01 10*3/uL (ref 0.00–0.07)
Basophils Absolute: 0 10*3/uL (ref 0.0–0.1)
Basophils Relative: 0 %
Eosinophils Absolute: 0 10*3/uL (ref 0.0–0.5)
Eosinophils Relative: 0 %
HCT: 30.4 % — ABNORMAL LOW (ref 36.0–46.0)
Hemoglobin: 10 g/dL — ABNORMAL LOW (ref 12.0–15.0)
Immature Granulocytes: 0 %
Lymphocytes Relative: 53 %
Lymphs Abs: 2.5 10*3/uL (ref 0.7–4.0)
MCH: 29.9 pg (ref 26.0–34.0)
MCHC: 32.9 g/dL (ref 30.0–36.0)
MCV: 91 fL (ref 80.0–100.0)
Monocytes Absolute: 0.4 10*3/uL (ref 0.1–1.0)
Monocytes Relative: 9 %
Neutro Abs: 1.9 10*3/uL (ref 1.7–7.7)
Neutrophils Relative %: 38 %
Platelets: 159 10*3/uL (ref 150–400)
RBC: 3.34 MIL/uL — ABNORMAL LOW (ref 3.87–5.11)
RDW: 14.6 % (ref 11.5–15.5)
WBC: 4.9 10*3/uL (ref 4.0–10.5)
nRBC: 0 % (ref 0.0–0.2)

## 2021-11-28 LAB — ACID FAST SMEAR (AFB, MYCOBACTERIA): Acid Fast Smear: NEGATIVE

## 2021-11-28 LAB — LACTIC ACID, PLASMA: Lactic Acid, Venous: 0.9 mmol/L (ref 0.5–1.9)

## 2021-11-28 MED ORDER — OXYCODONE HCL 5 MG PO TABS: 7.5000 mg | ORAL_TABLET | Freq: Four times a day (QID) | ORAL | 0 refills | Status: AC | PRN

## 2021-11-28 MED ORDER — OXYCODONE HCL 5 MG PO TABS: 7.5000 mg | ORAL_TABLET | ORAL | Status: DC | PRN

## 2021-11-28 MED ORDER — ACETAMINOPHEN 500 MG PO TABS
1000.0000 mg | ORAL_TABLET | Freq: Three times a day (TID) | ORAL | Status: DC
  Administered 2021-11-28: 1000 mg via ORAL
  Filled 2021-11-28: qty 2

## 2021-11-28 MED ORDER — CEFPODOXIME PROXETIL 200 MG PO TABS
200.0000 mg | ORAL_TABLET | Freq: Two times a day (BID) | ORAL | Status: DC
  Filled 2021-11-28: qty 1

## 2021-11-28 MED ORDER — ONDANSETRON HCL 4 MG PO TABS: 4.0000 mg | ORAL_TABLET | Freq: Four times a day (QID) | ORAL | 0 refills | Status: DC | PRN

## 2021-11-28 MED ORDER — OXYCODONE HCL 5 MG PO TABS
2.5000 mg | ORAL_TABLET | Freq: Once | ORAL | Status: AC
  Administered 2021-11-28: 2.5 mg via ORAL
  Filled 2021-11-28: qty 1

## 2021-11-28 MED ORDER — ACETAMINOPHEN 500 MG PO TABS: 1000.0000 mg | ORAL_TABLET | Freq: Three times a day (TID) | ORAL | 0 refills | Status: DC

## 2021-11-28 MED ORDER — OXYCODONE HCL 5 MG PO TABS
5.0000 mg | ORAL_TABLET | ORAL | Status: DC | PRN
  Administered 2021-11-28: 5 mg via ORAL

## 2021-11-28 MED ORDER — CEFPODOXIME PROXETIL 200 MG PO TABS: 200.0000 mg | ORAL_TABLET | Freq: Two times a day (BID) | ORAL | 0 refills | Status: DC

## 2021-11-28 MED ORDER — SODIUM CHLORIDE 0.9 % IV SOLN: INTRAVENOUS | Status: DC | PRN

## 2021-11-28 NOTE — Progress Notes (Signed)
Subjective:  POD #2 s/p I&D of right hand for possible necrotizing fasciitis.  No findings of necrotizing fasciitis were found during surgery.  Findings more consistent with cellulitis.  Patient reports right hand pain as mild to moderate.  Patient explains that she is able to move her fingers better today.  She is regaining feeling in her right thumb.  Patient is seen with Dr. Joylene Draft and Dr. Maryfrances Bunnell.  Objective:   VITALS:   Vitals:   11/27/21 1953 11/27/21 2150 11/28/21 0459 11/28/21 0823  BP: 108/69 131/73 95/61 103/75  Pulse: 83 83 68 64  Resp: 17 17 17 16   Temp: 98.1 F (36.7 C)  98 F (36.7 C) 97.6 F (36.4 C)  TempSrc:   Oral   SpO2: 99% 100% 99% 99%  Weight:      Height:        PHYSICAL EXAM: Right extremity: Dressings were removed.  Patient's incisions are clean dry and intact.  There is no skin necrosis.  There is no active drainage.  The erythema is dramatically improved.  There is no significant swelling.  Dr. took images of the patient's hand for the epic chart. Neurovascular intact Sensation intact distally Intact pulses distally Dorsiflexion/Plantar flexion intact Incision: no drainage No cellulitis present Compartment soft     LABS  Results for orders placed or performed during the hospital encounter of 11/25/21 (from the past 24 hour(s))  CBC with Differential/Platelet     Status: Abnormal   Collection Time: 11/28/21  4:27 AM  Result Value Ref Range   WBC 4.9 4.0 - 10.5 K/uL   RBC 3.34 (L) 3.87 - 5.11 MIL/uL   Hemoglobin 10.0 (L) 12.0 - 15.0 g/dL   HCT 01/26/22 (L) 99.8 - 33.8 %   MCV 91.0 80.0 - 100.0 fL   MCH 29.9 26.0 - 34.0 pg   MCHC 32.9 30.0 - 36.0 g/dL   RDW 25.0 53.9 - 76.7 %   Platelets 159 150 - 400 K/uL   nRBC 0.0 0.0 - 0.2 %   Neutrophils Relative % 38 %   Neutro Abs 1.9 1.7 - 7.7 K/uL   Lymphocytes Relative 53 %   Lymphs Abs 2.5 0.7 - 4.0 K/uL   Monocytes Relative 9 %   Monocytes Absolute 0.4 0.1 - 1.0 K/uL    Eosinophils Relative 0 %   Eosinophils Absolute 0.0 0.0 - 0.5 K/uL   Basophils Relative 0 %   Basophils Absolute 0.0 0.0 - 0.1 K/uL   Immature Granulocytes 0 %   Abs Immature Granulocytes 0.01 0.00 - 0.07 K/uL  Basic metabolic panel     Status: Abnormal   Collection Time: 11/28/21  4:27 AM  Result Value Ref Range   Sodium 142 135 - 145 mmol/L   Potassium 4.5 3.5 - 5.1 mmol/L   Chloride 114 (H) 98 - 111 mmol/L   CO2 25 22 - 32 mmol/L   Glucose, Bld 105 (H) 70 - 99 mg/dL   BUN 7 6 - 20 mg/dL   Creatinine, Ser 01/26/22 0.44 - 1.00 mg/dL   Calcium 8.2 (L) 8.9 - 10.3 mg/dL   GFR, Estimated 9.37 >90 mL/min   Anion gap 3 (L) 5 - 15  Lactic acid, plasma     Status: None   Collection Time: 11/28/21  4:27 AM  Result Value Ref Range   Lactic Acid, Venous 0.9 0.5 - 1.9 mmol/L    No results found.  Assessment/Plan: 2 Days Post-Op   Principal Problem:  Right arm cellulitis  Patient doing well following I&D of the right hand.  Blood cultures and wound culture from the OR are negative to date.  Patient has had dramatic clinical improvement of her right hand and wrist.  Patient will be switched to cefpodoxime per Dr. Rivka Safer (ID) and the patient will be discharged home.  She will keep her dressing on and dry until follow-up.  Patient will follow-up in my office in 7 to 10 days for wound check and suture removal.  Patient should cover her bandages the plastic bag for showering.    Juanell Fairly , MD 11/28/2021, 2:31 PM

## 2021-11-28 NOTE — Assessment & Plan Note (Addendum)
Patient given prompt fluids, antibiotics.  Cultures obtained before the administration of antibiotics.  She admitted and taken to the OR.  She had successful I&D of the wound.    Blood cultures remained negative and intraoperative cultures obtained after the administration of antibiotics remained negative. AFB cultures were sent.  ID was consulted.   The patient defervesced well and she was discharged with total 14 days antibiotics with cefpodoxime, as well as close orthopedics and Infectious Disease follow up.

## 2021-11-28 NOTE — Progress Notes (Signed)
° °  Date of Admission:  11/25/2021     ID: Chelsea Stanley is a 56 y.o. female  Principal Problem:   Right arm cellulitis    Subjective: Doing much better Arm pain and swelling much better No fever  Medications:   acetaminophen  1,000 mg Oral Q8H   clonazePAM  1.5 mg Oral QHS   enoxaparin (LOVENOX) injection  40 mg Subcutaneous Q24H   mirtazapine  15 mg Oral QHS   sertraline  50 mg Oral QHS   topiramate  75 mg Oral Daily    Objective: Vital signs in last 24 hours: Temp:  [97.6 F (36.4 C)-98.1 F (36.7 C)] 97.6 F (36.4 C) (01/12 0823) Pulse Rate:  [64-83] 64 (01/12 0823) Resp:  [15-17] 16 (01/12 0823) BP: (94-131)/(61-75) 103/75 (01/12 0823) SpO2:  [99 %-100 %] 99 % (01/12 0823)  PHYSICAL EXAM:  General: Alert, cooperative, no distress, appears stated age.  Lungs: Clear to auscultation bilaterally. No Wheezing or Rhonchi. No rales. Heart: Regular rate and rhythm, no murmur, rub or gallop. Abdomen: Soft, non-tender,not distended. Bowel sounds normal. No masses Extremities:  Rt arm- swelling/erythema almost resolved Surgical site looks good 11/28/21   11/28/21   On admission   Skin: No rashes or lesions. Or bruising Lymph: Cervical, supraclavicular normal. Neurologic: Grossly non-focal  Lab Results Recent Labs    11/27/21 0442 11/28/21 0427  WBC 5.0 4.9  HGB 10.1* 10.0*  HCT 30.6* 30.4*  NA 139 142  K 4.2 4.5  CL 109 114*  CO2 28 25  BUN <5* 7  CREATININE 0.54 0.55   Liver Panel Recent Labs    11/26/21 0008  PROT 6.7  ALBUMIN 3.8  AST 15  ALT 9  ALKPHOS 22  BILITOT 0.4   Microbiology: BC NG Wc NG so far     Assessment/Plan: Cellulitis of the rt arm/hand following a cut and contamination with fish tank water Necrotizing soft tissue was a concern as there were gas in the [place] tissue- underwent surgery but no pus seen- pt's hand/arm improved dramatically after Incision and washout and IV antibiotics within 24 hrs She is on meropenem   Cultures neg so far Will send her on cefpodoxime 200mg  PO BID for 10 days She will follow up with Dr.Krasinski as OP Will contact her if any culture including mycobacteria  comes positive .     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 Pt seen with ortho and hospitalist

## 2021-11-28 NOTE — Hospital Course (Addendum)
Chelsea Stanley is a 56 y.o. F hx depression, anxiety and seizure disorder who presented with acute swelling, redness and pain of the right wrist and hand, fever and chills.    About 1 week PTA, she cut her right wrist on a glass picture frame.  2-3 days later, she cleaned her fish tank without gloves, and in the next few days, started to notice redness, swelilng and pain.   In the ER, CT imaging showed possible gaseous infection, and the patient had hypotension, tachycardia, tachpynea and elevated lactic acid. Extensive efforts were made to transfer the patient to a tertiary center with specific hand orthopedics expertise but due to current nursing shortages this was impossible.  Dr. Martha Clan from Orthopedics was consulted who agreed to evaluate the patient.

## 2021-11-28 NOTE — TOC Benefit Eligibility Note (Signed)
Patient Product/process development scientist completed.    The patient is currently admitted and upon discharge could be taking cefpodoxime (Vantin) 200 mg tablet.  The current 20 day co-pay is, $0.00.   The patient is insured through [facility]    Roland Earl, CPhT Pharmacy Patient Advocate Specialist [facility] Patient Advocate Team Direct Number: [phone]  Fax: [phone]

## 2021-11-28 NOTE — Discharge Summary (Signed)
Physician Discharge Summary   Patient: Chelsea Stanley MRN: [id number] DOB: [birth year]  Admit date:     11/25/2021  Discharge date: 11/28/21  Discharge Physician: Edwin Dada   PCP: Romualdo Bolk, FNP   Recommendations at discharge:   Follow up with Orthopedics Dr. Mack Guise in 7-10 days for suture removal Follow-up with infectious disease, Dr. Steva Ready in 10 days      Discharge Diagnoses Principal Problem:   Severe sepsis with septic shock [facility]) Active Problems:   Seizure disorder ([place])   Mood disorder [facility])    Hospital Course   Chelsea Stanley is a 56 y.o. F hx depression, anxiety and seizure disorder who presented with acute swelling, redness and pain of the right wrist and hand, fever and chills.    About 1 week PTA, she cut her right wrist on a glass picture frame.  2-3 days later, she cleaned her fish tank without gloves, and in the next few days, started to notice redness, swelilng and pain.   In the ER, CT imaging showed possible gaseous infection, and the patient had hypotension, tachycardia, tachpynea and elevated lactic acid. Extensive efforts were made to transfer the patient to a tertiary center with specific hand orthopedics expertise but due to current nursing shortages this was impossible.  Dr. Mack Guise from Orthopedics was consulted who agreed to evaluate the patient.      * Severe sepsis with septic shock ([place])- (present on admission) Patient given prompt fluids, antibiotics.  Cultures obtained before the administration of antibiotics.  She admitted and taken to the OR.  She had successful I&D of the wound.    Blood cultures remained negative and intraoperative cultures obtained after the administration of antibiotics remained negative. AFB cultures were sent.  ID was consulted.   The patient defervesced well and she was discharged with total 14 days antibiotics with cefpodoxime, as well as close orthopedics and Infectious Disease follow  up.  Seizure disorder ([place])    Mood disorder ([place])- (present on admission)        Pain control - [place] Controlled Substance Reporting System database was reviewed. and patient was instructed, not to drive, operate heavy machinery, perform activities at heights, swimming or participation in water activities or provide baby-sitting services while on Pain, Sleep and Anxiety Medications; until their outpatient Physician has advised to do so again. Also recommended to not to take more than prescribed Pain, Sleep and Anxiety Medications.   Consultants: Orthopedics, infectious disease Procedures performed: Incision and drainage of the right wrist Disposition: Home Diet recommendation: Regular diet  DISCHARGE MEDICATION: Allergies as of 11/28/2021       Reactions   Clindamycin/lincomycin Anaphylaxis   Peach [prunus Persica] Anaphylaxis   Any fuzzy fruit   Penicillins Anaphylaxis   Has patient had a PCN reaction causing immediate rash, facial/tongue/throat swelling, SOB or lightheadedness with hypotension: Yes Has patient had a PCN reaction causing severe rash involving mucus membranes or skin necrosis: Yes Has patient had a PCN reaction that required hospitalization: Yes Has patient had a PCN reaction occurring within the last 10 years: No If all of the above answers are "NO", then may proceed with Cephalosporin use.   Strawberry (diagnostic) Anaphylaxis   Honey Bee Venom [bee Venom]    Peanut-containing Drug Products Swelling   Benadryl [diphenhydramine] Hives, Anxiety, Palpitations   Latex Rash   Levofloxacin Rash   Sulfa Antibiotics Rash   Vancomycin Rash   Voltaren [diclofenac] Rash        Medication  List     STOP taking these medications    doxycycline 100 MG tablet Commonly known as: VIBRA-TABS   hydrOXYzine 50 MG tablet Commonly known as: ATARAX       TAKE these medications    acetaminophen 500 MG tablet Commonly known as: TYLENOL Take 2 tablets  (1,000 mg total) by mouth every 8 (eight) hours.   Butalbital-APAP-Caffeine 50-325-40 MG capsule Take 1 capsule by mouth every 4 (four) hours as needed.   cefpodoxime 200 MG tablet Commonly known as: VANTIN Take 1 tablet (200 mg total) by mouth 2 (two) times daily with a meal.   clonazePAM 0.5 MG tablet Commonly known as: KLONOPIN Take 3 tablets by mouth at bedtime.   melatonin 5 MG Tabs Take 5 mg by mouth at bedtime as needed.   mirtazapine 15 MG tablet Commonly known as: REMERON Take 15 mg by mouth at bedtime.   ondansetron 4 MG tablet Commonly known as: ZOFRAN Take 1 tablet (4 mg total) by mouth every 6 (six) hours as needed for nausea.   oxyCODONE 5 MG immediate release tablet Commonly known as: Oxy IR/ROXICODONE Take 1.5 tablets (7.5 mg total) by mouth every 6 (six) hours as needed for up to 7 days for severe pain.   oxymetazoline 0.05 % nasal spray Commonly known as: AFRIN Place 1 spray into both nostrils 2 (two) times daily as needed for congestion.   polyethylene glycol 17 g packet Commonly known as: MIRALAX / GLYCOLAX Take 17 g by mouth every other day.   promethazine 12.5 MG tablet Commonly known as: PHENERGAN Take 12.5 mg by mouth every 6 (six) hours as needed for nausea or vomiting.   sertraline 50 MG tablet Commonly known as: ZOLOFT Take 50 mg by mouth at bedtime.   topiramate 25 MG tablet Commonly known as: TOPAMAX Take 75 mg by mouth daily.   traZODone 100 MG tablet Commonly known as: DESYREL Take 100 mg by mouth daily.               Discharge Care Instructions  (From admission, onward)           Start     Ordered   11/28/21 0000  Discharge wound care:       Comments: Keep bandage on the right hand in place until follow up in 7-10 days in Dr. Samuel Germany office.    Cover the bandage with a plastic bag for showers.    Please call [facility] [phone] to make her an appt for suture removal.   11/28/21 1458             Follow-up Information     Juanell Fairly, MD. Go on 12/04/2021.   Specialty: Orthopedic Surgery Why: Appt @ 3:00 pm Contact information: [address] [place] [address] [phone]         Lynn Ito, MD. Schedule an appointment as soon as possible for a visit in 2 week(s).   Specialty: Infectious Diseases Why: Office will call Patient w/. Appt. Contact information: [address] [place] [address] [phone]                Discharge Instructions     Discharge instructions   Complete by: As directed    From Dr. Maryfrances Bunnell: You were admitted for a hand infection This required surgical washout in the OR Your cultures from the surgery thankfully were not growing anything concerning.  For the infection: Take cefpodoxime 300 mg twice daily for 11 more days  Follow up with Dr. Delaine Lame from infectious disease   For the hand: Follow all instructions provided by Dr. Mack Guise Follow up in his office soon, within the time frame he recommended For pain: Take acetaminophen 1000 mg three times daily If needed, take oxycodone 7.5 mg (1 1/2 tabs) up to four times daily Do not take this with sedating medicines like trazodone or clonazepam or hydroxyzine  Do not drive while taking them   Discharge wound care:   Complete by: As directed    Keep bandage on the right hand in place until follow up in 7-10 days in Dr. Harden Mo office.    Cover the bandage with a plastic bag for showers.    Please call [facility] [phone] to make her an appt for suture removal.   Increase activity slowly   Complete by: As directed        Discharge Exam: Filed Weights   11/26/21 1800  Weight: 49.9 kg   General: Pt is alert, awake, not in acute distress Cardiovascular: RRR, nl S1-S2, no murmurs appreciated.   No LE edema.   Respiratory: Normal respiratory rate and rhythm.  CTAB without rales or wheezes. Abdominal: Abdomen soft and non-tender.  No  distension or HSM.   MSK: Right wrist was examined with orthopedics and infectious disease, swelling and redness improved, her strength and range of motion are as expected Neuro/Psych: Strength symmetric in upper and lower extremities.  Judgment and insight appear normal.   Condition at discharge: good  The results of significant diagnostics from this hospitalization (including imaging, microbiology, ancillary and laboratory) are listed below for reference.   Imaging Studies: CT WRIST RIGHT W CONTRAST  Result Date: 11/26/2021 CLINICAL DATA:  Right hand trauma 1 week ago with increasing lateral-sided hand and wrist swelling since then. EXAM: CT OF THE UPPER RIGHT EXTREMITY WITH CONTRAST TECHNIQUE: Multidetector CT imaging of the upper right forearm, wrist and hand was performed according to the standard protocol following intravenous contrast administration. CONTRAST:  83mL OMNIPAQUE IOHEXOL 300 MG/ML  SOLN COMPARISON:  Right hand series yesterday's the only previous. FINDINGS: Bones/Joint/Cartilage There is normal bone mineralization. The fingers are flexed limiting evaluation of the phalanges. No fractures or focal bone lesions are observed. There are no erosive lesions or focal periosteal reactions. Joint spaces are maintained. Ligaments Suboptimally assessed by CT. However, osseous structures are normally aligned. Muscles and Tendons All regional tendons are grossly intact. No intramuscular masses or fluid collections are seen. Soft tissues There is moderate superficial edema along the dorsal/dorsolateral, ventral and lateral aspect of the wrist and proximal hand, ventrally in the distal forearm. There are small scattered air pockets in the soft tissues at the lateral and ventrolateral aspect at the level of the wrist concerning for a gas-forming infection. No localized abscess is seen. IMPRESSION: 1. Soft tissue edema in the distal forearm, wrist and hand, with small scattered air pockets in the soft  tissues laterally, findings worrisome for a gas-forming cellulitis. No localized fluid collection is seen in the superficial soft tissues and regional muscles, but underlying myositis is difficult to exclude on CT. Consider follow-up MRI if clinically warranted. There are no findings suspicious for acute osteomyelitis. 2. No evidence of fracture, dislocation or significant degenerative changes, with limited view of the phalanges due to flexion of the fingers. Electronically Signed   By: Telford Nab M.D.   On: 11/26/2021 02:10   DG Hand Complete Right  Result Date: 11/25/2021 CLINICAL DATA:  Pain EXAM: RIGHT HAND -  COMPLETE  VIEW COMPARISON:  None. FINDINGS: There is no evidence of fracture or dislocation. There is no evidence of arthropathy or other focal bone abnormality. Soft tissue swelling with associated soft tissue lucencies of the radial aspect of the wrist. IMPRESSION: 1. Soft tissue swelling with associated soft tissue gas, concerning for infection with gas-forming organism. 2. No acute osseous abnormality. Electronically Signed   By: Yetta Glassman M.D.   On: 11/25/2021 18:56    Microbiology: Results for orders placed or performed during the hospital encounter of 11/25/21  Blood culture (routine x 2)     Status: None (Preliminary result)   Collection Time: 11/26/21 12:08 AM   Specimen: BLOOD  Result Value Ref Range Status   Specimen Description BLOOD LEFT ASSIST CONTROL  Final   Special Requests   Final    BOTTLES DRAWN AEROBIC AND ANAEROBIC Blood Culture results may not be optimal due to an excessive volume of blood received in culture bottles   Culture   Final    NO GROWTH 2 DAYS Performed at [facility], [address], [place], [place] [address]    Report Status PENDING  Incomplete  Blood culture (routine x 2)     Status: None (Preliminary result)   Collection Time: 11/26/21 12:08 AM   Specimen: BLOOD  Result Value Ref Range Status   Specimen Description BLOOD  LEFT FOREARM  Final   Special Requests   Final    BOTTLES DRAWN AEROBIC AND ANAEROBIC Blood Culture results may not be optimal due to an excessive volume of blood received in culture bottles   Culture   Final    NO GROWTH 2 DAYS Performed at [facility], [address] [place], [place] [address]    Report Status PENDING  Incomplete  Resp Panel by RT-PCR (Flu A&B, Covid) Nasopharyngeal Swab     Status: None   Collection Time: 11/26/21 12:08 AM   Specimen: Nasopharyngeal Swab; Nasopharyngeal(NP) swabs in vial transport medium  Result Value Ref Range Status   SARS Coronavirus 2 by RT PCR NEGATIVE NEGATIVE Final    Comment: (NOTE) SARS-CoV-2 target nucleic acids are NOT DETECTED.  The SARS-CoV-2 RNA is generally detectable in upper respiratory specimens during the acute phase of infection. The lowest concentration of SARS-CoV-2 viral copies this assay can detect is 138 copies/mL. A negative result does not preclude SARS-Cov-2 infection and should not be used as the sole basis for treatment or other patient management decisions. A negative result may occur with  improper specimen collection/handling, submission of specimen other than nasopharyngeal swab, presence of viral mutation(s) within the areas targeted by this assay, and inadequate number of viral copies(<138 copies/mL). A negative result must be combined with clinical observations, patient history, and epidemiological information. The expected result is Negative.  Fact Sheet for Patients:  EntrepreneurPulse.com.au  Fact Sheet for Healthcare Providers:  IncredibleEmployment.be  This test is no t yet approved or cleared by the [place] FDA and  has been authorized for detection and/or diagnosis of SARS-CoV-2 by FDA under an Emergency Use Authorization (EUA). This EUA will remain  in effect (meaning this test can be used) for the duration of the COVID-19 declaration under  Section 564(b)(1) of the Act, 21 U.S.C.section 360bbb-3(b)(1), unless the authorization is terminated  or revoked sooner.       Influenza A by PCR NEGATIVE NEGATIVE Final   Influenza B by PCR NEGATIVE NEGATIVE Final    Comment: (NOTE) The Xpert Xpress SARS-CoV-2/FLU/RSV plus assay is  intended as an aid in the diagnosis of influenza from Nasopharyngeal swab specimens and should not be used as a sole basis for treatment. Nasal washings and aspirates are unacceptable for Xpert Xpress SARS-CoV-2/FLU/RSV testing.  Fact Sheet for Patients: EntrepreneurPulse.com.au  Fact Sheet for Healthcare Providers: IncredibleEmployment.be  This test is not yet approved or cleared by the [place] FDA and has been authorized for detection and/or diagnosis of SARS-CoV-2 by FDA under an Emergency Use Authorization (EUA). This EUA will remain in effect (meaning this test can be used) for the duration of the COVID-19 declaration under Section 564(b)(1) of the Act, 21 U.S.C. section 360bbb-3(b)(1), unless the authorization is terminated or revoked.  Performed at [facility], [address] [place], [place] [address]   Aerobic/Anaerobic Culture w Gram Stain (surgical/deep wound)     Status: None (Preliminary result)   Collection Time: 11/26/21  6:26 AM   Specimen: PATH Other; Wound  Result Value Ref Range Status   Specimen Description   Final    WOUND Performed at [facility], [address] [place], [place] [address]    Special Requests   Final    RIGHT HAND Performed at [facility], [place], [place] [address]    Gram Stain NO WBC SEEN NO ORGANISMS SEEN   Final   Culture   Final    NO GROWTH 2 DAYS NO ANAEROBES ISOLATED; CULTURE IN PROGRESS FOR 5 DAYS Performed at [place] [address] [place], [place] [address]    Report Status PENDING  Incomplete  Acid Fast Smear (AFB)     Status: None    Collection Time: 11/26/21  6:26 AM   Specimen: Wound  Result Value Ref Range Status   AFB Specimen Processing Concentration  Final   Acid Fast Smear Negative  Final    Comment: (NOTE) Performed At: [facility] [place], [place] [id number] Rush Farmer MD [id number]    Source (AFB) WOUND  Final    Comment: RIGHT HAND Performed at [facility], [place] [address], [place], [place] [address]     Labs: CBC: Recent Labs  Lab 11/26/21 0008 11/27/21 0442 11/28/21 0427  WBC 7.7 5.0 4.9  NEUTROABS 4.7  --  1.9  HGB 11.6* 10.1* 10.0*  HCT 35.9* 30.6* 30.4*  MCV 90.7 88.2 91.0  PLT 212 158 [id number]   Basic Metabolic Panel: Recent Labs  Lab 11/26/21 0008 11/27/21 0442 11/28/21 0427  NA 134* 139 142  K 3.7 4.2 4.5  CL 105 109 114*  CO2 24 28 25   GLUCOSE 118* 104* 105*  BUN 7 <5* 7  CREATININE 0.68 0.54 0.55  CALCIUM 8.5* 8.1* 8.2*   Liver Function Tests: Recent Labs  Lab 11/26/21 0008  AST 15  ALT 9  ALKPHOS 66  BILITOT 0.4  PROT 6.7  ALBUMIN 3.8   CBG: No results for input(s): GLUCAP in the last 168 hours.  Discharge time spent: 40 minutes.  Signed: Edwin Dada, MD [facility] 11/28/2021

## 2021-11-28 NOTE — TOC CM/SW Note (Signed)
Patient has orders to discharge home today. Chart reviewed. PCP is [name], FNP. On room air. Will follow up with ortho regarding wound care. No TOC needs identified. CSW signing off.  Charlynn Court, CSW [phone]

## 2021-11-29 ENCOUNTER — Other Ambulatory Visit: Payer: Self-pay | Admitting: Infectious Diseases

## 2021-11-29 ENCOUNTER — Other Ambulatory Visit (HOSPITAL_COMMUNITY): Payer: Self-pay

## 2021-11-29 ENCOUNTER — Telehealth: Payer: Self-pay

## 2021-11-29 NOTE — Telephone Encounter (Signed)
Patient left voicemail stating she was unable to get her cefpodoxime from the pharmacy because it needed a PA. RN called [facility], they state the medication is ready to be picked up. Called patient to let her know, no answer. Left non-specific voicemail stating her mediation is ready to be picked up. Left [facility] phone number in case she has any further questions. Did not leave any private or health information in message.   Sandie Ano, RN

## 2021-12-01 LAB — AEROBIC/ANAEROBIC CULTURE W GRAM STAIN (SURGICAL/DEEP WOUND)
Culture: NO GROWTH
Gram Stain: NONE SEEN

## 2021-12-01 LAB — CULTURE, BLOOD (ROUTINE X 2)
Culture: NO GROWTH
Culture: NO GROWTH

## 2021-12-02 ENCOUNTER — Telehealth: Payer: Self-pay

## 2021-12-02 NOTE — Telephone Encounter (Signed)
Left vm with contact info to reach me today and schedule HFU at [facility].

## 2021-12-02 NOTE — Telephone Encounter (Signed)
-----   Message from Beryle Flock, RN sent at 11/28/2021  4:38 PM EST ----- Received VM stating patient needs 2 week HSFU appt with Ravishankar. Patient phone number [phone]

## 2022-01-10 LAB — ACID FAST CULTURE WITH REFLEXED SENSITIVITIES (MYCOBACTERIA): Acid Fast Culture: NEGATIVE

## 2022-03-26 DIAGNOSIS — M79601 Pain in right arm: Secondary | ICD-10-CM | POA: Insufficient documentation

## 2022-12-27 ENCOUNTER — Inpatient Hospital Stay: Payer: 59

## 2022-12-27 ENCOUNTER — Emergency Department (EMERGENCY_DEPARTMENT_HOSPITAL)
Admission: EM | Admit: 2022-12-27 | Discharge: 2022-12-29 | Disposition: A | Discharge status: Other Acute Inpatient Hospital | Payer: 59 | Source: Home / Self Care | Attending: Emergency Medicine | Admitting: Emergency Medicine

## 2022-12-27 ENCOUNTER — Other Ambulatory Visit: Payer: Self-pay

## 2022-12-27 DIAGNOSIS — F1010 Alcohol abuse, uncomplicated: Secondary | ICD-10-CM | POA: Insufficient documentation

## 2022-12-27 DIAGNOSIS — T50904A Poisoning by unspecified drugs, medicaments and biological substances, undetermined, initial encounter: Secondary | ICD-10-CM

## 2022-12-27 DIAGNOSIS — F19921 Other psychoactive substance use, unspecified with intoxication with delirium: Secondary | ICD-10-CM | POA: Diagnosis not present

## 2022-12-27 DIAGNOSIS — Z1152 Encounter for screening for COVID-19: Secondary | ICD-10-CM | POA: Insufficient documentation

## 2022-12-27 DIAGNOSIS — T4392XA Poisoning by unspecified psychotropic drug, intentional self-harm, initial encounter: Secondary | ICD-10-CM | POA: Insufficient documentation

## 2022-12-27 DIAGNOSIS — T50902A Poisoning by unspecified drugs, medicaments and biological substances, intentional self-harm, initial encounter: Secondary | ICD-10-CM | POA: Diagnosis present

## 2022-12-27 DIAGNOSIS — R7989 Other specified abnormal findings of blood chemistry: Secondary | ICD-10-CM

## 2022-12-27 DIAGNOSIS — F332 Major depressive disorder, recurrent severe without psychotic features: Secondary | ICD-10-CM | POA: Diagnosis not present

## 2022-12-27 LAB — COMPREHENSIVE METABOLIC PANEL
ALT: 18 U/L (ref 0–44)
AST: 24 U/L (ref 15–41)
Albumin: 3.8 g/dL (ref 3.5–5.0)
Alkaline Phosphatase: 65 U/L (ref 38–126)
Anion gap: 9 (ref 5–15)
BUN: 8 mg/dL (ref 6–20)
CO2: 17 mmol/L — ABNORMAL LOW (ref 22–32)
Calcium: 8.4 mg/dL — ABNORMAL LOW (ref 8.9–10.3)
Chloride: 115 mmol/L — ABNORMAL HIGH (ref 98–111)
Creatinine, Ser: 0.72 mg/dL (ref 0.44–1.00)
GFR, Estimated: >60 mL/min (ref >60)
Glucose, Bld: 98 mg/dL (ref 70–99)
Potassium: 2.7 mmol/L — CL (ref 3.5–5.1)
Sodium: 141 mmol/L (ref 135–145)
Total Bilirubin: 0.6 mg/dL (ref 0.3–1.2)
Total Protein: 6.5 g/dL (ref 6.5–8.1)

## 2022-12-27 LAB — ETHANOL: Alcohol, Ethyl (B): 40 mg/dL — ABNORMAL HIGH (ref <10)

## 2022-12-27 LAB — CBC
HCT: 38.2 % (ref 36.0–46.0)
Hemoglobin: 12.4 g/dL (ref 12.0–15.0)
MCH: 29 pg (ref 26.0–34.0)
MCHC: 32.5 g/dL (ref 30.0–36.0)
MCV: 89.3 fL (ref 80.0–100.0)
Platelets: 185 10*3/uL (ref 150–400)
RBC: 4.28 MIL/uL (ref 3.87–5.11)
RDW: 14.2 % (ref 11.5–15.5)
WBC: 4.9 10*3/uL (ref 4.0–10.5)
nRBC: 0 % (ref 0.0–0.2)

## 2022-12-27 LAB — HCG, QUANTITATIVE, PREGNANCY: hCG, Beta Chain, Quant, S: 6 m[IU]/mL — ABNORMAL HIGH (ref <5)

## 2022-12-27 LAB — SALICYLATE LEVEL: Salicylate Lvl: <7 mg/dL — ABNORMAL LOW (ref 7.0–30.0)

## 2022-12-27 LAB — ACETAMINOPHEN LEVEL: Acetaminophen (Tylenol), Serum: <10 ug/mL — ABNORMAL LOW (ref 10–30)

## 2022-12-27 LAB — LIPASE, BLOOD: Lipase: 34 U/L (ref 11–51)

## 2022-12-27 MED ORDER — NOREPINEPHRINE 4 MG/250ML-% IV SOLN: 2.0000 ug/min | INTRAVENOUS | Status: DC

## 2022-12-27 MED ORDER — POTASSIUM CHLORIDE 10 MEQ/100ML IV SOLN
10.0000 meq | INTRAVENOUS | Status: AC
  Administered 2022-12-27 – 2022-12-28 (×2): 10 meq via INTRAVENOUS
  Filled 2022-12-27 (×2): qty 100

## 2022-12-27 MED ORDER — POLYETHYLENE GLYCOL 3350 17 G PO PACK: 17.0000 g | PACK | Freq: Every day | ORAL | Status: DC | PRN

## 2022-12-27 MED ORDER — DOCUSATE SODIUM 100 MG PO CAPS: 100.0000 mg | ORAL_CAPSULE | Freq: Two times a day (BID) | ORAL | Status: DC | PRN

## 2022-12-27 MED ORDER — SODIUM CHLORIDE 0.9 % IV BOLUS
1000.0000 mL | Freq: Once | INTRAVENOUS | Status: AC
  Administered 2022-12-27: 1000 mL via INTRAVENOUS

## 2022-12-27 MED ORDER — SODIUM CHLORIDE 0.9 % IV SOLN
250.0000 mL | INTRAVENOUS | Status: DC
  Administered 2022-12-28: 250 mL via INTRAVENOUS

## 2022-12-27 NOTE — ED Notes (Signed)
Patient transported back from CT 

## 2022-12-27 NOTE — ED Notes (Signed)
Quale MD notified of potassium of 2.7

## 2022-12-27 NOTE — ED Notes (Signed)
Called lab to add-on ethanol. Lab states they will add-on if there is enough of the sample.

## 2022-12-27 NOTE — BH Assessment (Signed)
Comprehensive Clinical Assessment (CCA) Note  12/27/2022 Chelsea Stanley [id number] Recommendations for Services/Supports/Treatments: Consulted with Rashaun D., NP, who determined pt. meets inpatient criteria. Notified Dr. Jacqualine Code and Claris Pong, RN aware of disposition recommendation.  Chelsea Stanley is a 57 year old, English speaking, white female with a history of a mood disorder. Pt presented to [facility] ED under IVC due to being aggressive with law enforcement and endorsing suicide. Upon assessment, pt. was lethargic but agreed to participate in the assessment. Per patient report, her main behaviors stemmed from being in a bad mood. Pt was guarded, refusing to provide details of the disagreement with her husband prior to arrival. Pt would not identify why she'd become dysregulated. When asked about being depressed, pt denied having depression, explaining that she gets that way sometimes but not currently. Pt reported that she is not connected to any mental health services. Pt denied having a mental health diagnosis and refused to respond when asked about medications. Pt had poor insight and judgement. Pt's thoughts were relevant and linear. Pt did not appear to be responding to internal stimuli. Pt presented with an appropriate mood and a congruent affect. Pt had a bizarre appearance and refused to open her eyes throughout the assessment. The patient denied current SI, HI or AV/H. Pt's BAL was 41; UDS was unremarkable.  Chief Complaint:  Chief Complaint  Patient presents with   Drug Overdose   Suicidal   Aggressive Behavior   Visit Diagnosis: MDD severe    CCA Screening, Triage and Referral (STR)  Patient Reported Information How did you hear about [place]? No data recorded Referral name: No data recorded Referral phone number: No data recorded  Whom do you see for routine medical problems? No data recorded Practice/Facility Name: No data recorded Practice/Facility Phone Number: No data  recorded Name of Contact: No data recorded Contact Number: No data recorded Contact Fax Number: No data recorded Prescriber Name: No data recorded Prescriber Address (if known): No data recorded  What Is the Reason for Your Visit/Call Today? No data recorded How Long Has This Been Causing You Problems? No data recorded What Do You Feel Would Help You the Most Today? No data recorded  Have You Recently Been in Any Inpatient Treatment (Hospital/Detox/Crisis Center/28-Day Program)? No data recorded Name/Location of Program/Hospital:No data recorded How Long Were You There? No data recorded When Were You Discharged? No data recorded  Have You Ever Received Services From [facility] Before? No data recorded Who Do You See at [facility]? No data recorded  Have You Recently Had Any Thoughts About Hurting Yourself? No data recorded Are You Planning to Commit Suicide/Harm Yourself At This time? No data recorded  Have you Recently Had Thoughts About [place]? No data recorded Explanation: No data recorded  Have You Used Any Alcohol or Drugs in the Past 24 Hours? No data recorded How Long Ago Did You Use Drugs or Alcohol? No data recorded What Did You Use and How Much? No data recorded  Do You Currently Have a Therapist/Psychiatrist? No data recorded Name of Therapist/Psychiatrist: No data recorded  Have You Been Recently Discharged From Any Office Practice or Programs? No data recorded Explanation of Discharge From Practice/Program: No data recorded    CCA Screening Triage Referral Assessment Type of Contact: No data recorded Is this Initial or Reassessment? No data recorded Date Telepsych consult ordered in [facility]:  No data recorded Time Telepsych consult ordered in [facility]:  No data recorded  Patient Reported Information Reviewed? No  data recorded Patient Left Without Being Seen? No data recorded Reason for Not Completing Assessment: No data recorded  Collateral  Involvement: No data recorded  Does Patient Have a Elliott? No data recorded Name and Contact of Legal Guardian: No data recorded If Minor and Not Living with Parent(s), Who has Custody? No data recorded Is CPS involved or ever been involved? No data recorded Is APS involved or ever been involved? No data recorded  Patient Determined To Be At Risk for Harm To Self or Others Based on Review of Patient Reported Information or Presenting Complaint? No data recorded Method: No data recorded Availability of Means: No data recorded Intent: No data recorded Notification Required: No data recorded Additional Information for Danger to Others Potential: No data recorded Additional Comments for Danger to Others Potential: No data recorded Are There Guns or Other Weapons in Your Home? No data recorded Types of Guns/Weapons: No data recorded Are These Weapons Safely Secured?                            No data recorded Who Could Verify You Are Able To Have These Secured: No data recorded Do You Have any Outstanding Charges, Pending Court Dates, Parole/Probation? No data recorded Contacted To Inform of Risk of Harm To Self or Others: No data recorded  Location of Assessment: No data recorded  Does Patient Present under Involuntary Commitment? No data recorded IVC Papers Initial File Date: No data recorded  [place] of Residence: No data recorded  Patient Currently Receiving the Following Services: No data recorded  Determination of Need: No data recorded  Options For Referral: No data recorded    CCA Biopsychosocial Intake/Chief Complaint:  No data recorded Current Symptoms/Problems: No data recorded  Patient Reported Schizophrenia/Schizoaffective Diagnosis in Past: No data recorded  Strengths: No data recorded Preferences: No data recorded Abilities: No data recorded  Type of Services Patient Feels are Needed: No data recorded  Initial Clinical Notes/Concerns:  No data recorded  Mental Health Symptoms Depression:  No data recorded  Duration of Depressive symptoms: No data recorded  Mania:  No data recorded  Anxiety:   No data recorded  Psychosis:  No data recorded  Duration of Psychotic symptoms: No data recorded  Trauma:  No data recorded  Obsessions:  No data recorded  Compulsions:  No data recorded  Inattention:  No data recorded  Hyperactivity/Impulsivity:  No data recorded  Oppositional/Defiant Behaviors:  No data recorded  Emotional Irregularity:  No data recorded  Other Mood/Personality Symptoms:  No data recorded   Mental Status Exam Appearance and self-care  Stature:  No data recorded  Weight:  No data recorded  Clothing:  No data recorded  Grooming:  No data recorded  Cosmetic use:  No data recorded  Posture/gait:  No data recorded  Motor activity:  No data recorded  Sensorium  Attention:  No data recorded  Concentration:  No data recorded  Orientation:  No data recorded  Recall/memory:  No data recorded  Affect and Mood  Affect:  No data recorded  Mood:  No data recorded  Relating  Eye contact:  No data recorded  Facial expression:  No data recorded  Attitude toward examiner:  No data recorded  Thought and Language  Speech flow: No data recorded  Thought content:  No data recorded  Preoccupation:  No data recorded  Hallucinations:  No data recorded  Organization:  No data recorded  Transport planner of Knowledge:  No data recorded  Intelligence:  No data recorded  Abstraction:  No data recorded  Judgement:  No data recorded  Reality Testing:  No data recorded  Insight:  No data recorded  Decision Making:  No data recorded  Social Functioning  Social Maturity:  No data recorded  Social Judgement:  No data recorded  Stress  Stressors:  No data recorded  Coping Ability:  No data recorded  Skill Deficits:  No data recorded  Supports:  No data recorded    Religion:    Leisure/Recreation:     Exercise/Diet:     CCA Employment/Education Employment/Work Situation:    Education:     CCA Family/Childhood History Family and Relationship History:    Childhood History:     Child/Adolescent Assessment:     CCA Substance Use Alcohol/Drug Use:                           ASAM's:  Six Dimensions of Multidimensional Assessment  Dimension 1:  Acute Intoxication and/or Withdrawal Potential:      Dimension 2:  Biomedical Conditions and Complications:      Dimension 3:  Emotional, Behavioral, or Cognitive Conditions and Complications:     Dimension 4:  Readiness to Change:     Dimension 5:  Relapse, Continued use, or Continued Problem Potential:     Dimension 6:  Recovery/Living Environment:     ASAM Severity Score:    ASAM Recommended Level of Treatment:     Substance use Disorder (SUD)    Recommendations for Services/Supports/Treatments:   Automatic Data, LCAS

## 2022-12-27 NOTE — ED Provider Notes (Signed)
[facility] Provider Note    Event Date/Time   First MD Initiated Contact with Patient 12/27/22 2053     (approximate)   History   Drug Overdose, Suicidal, and Aggressive Behavior    HPI  Chelsea Stanley is a 57 y.o. female    Patient reports to me that she recalls police coming to her home after she and her boyfriend got in an argument.  She is not exactly sure why but she reports that she had recalls that she does not drink a lot or often, but tonight she had a cup of liquor and beer.  She knows that her boyfriend has a BB gun or something like that but does not recall making any threats  EMS reports the sheriff's department called him out because the patient was wielding a BB gun and making threats against her husband and also suicidal ideation  Patient reports she does not want to harm herself or anyone else.  When asked about multiple empty pill bottles she reports that she ran out of her medications a while ago but she hangs onto the bottle so she knows what medicines she needs to have refilled from time to time.  She denies any overdose or ingestion.   Patient medical history notable for a discharge from January of last year for severe sepsis and shock after previous hand injury.  Also has a history of seizure disorder and mood disorder  Physical Exam   Triage Vital Signs: ED Triage Vitals  Enc Vitals Group     BP      Pulse      Resp      Temp      Temp src      SpO2      Weight      Height      Head Circumference      Peak Flow      Pain Score      Pain Loc      Pain Edu?      Excl. in [place]?     Most recent vital signs: Vitals:   12/27/22 2310 12/27/22 2322  BP: (!) 86/55 95/63  Pulse: 77 80  Resp:  14  Temp:    SpO2: 97% 99%     General: Awake, somnolent but in no distress.  Able to provide some history does not recall exactly what happened but recalls the police coming CV:  Good peripheral perfusion.  Normal tones and  rate Resp:  Normal effort.  Clear bilateral Abd:  No distention.  Other:  No facial droop.  Speech minimally slurred.  Moves all extremities well to command.  Alerts to voice.  No acute distress  Currently denies suicidal or homicidal ideation, but report by EMS as the patient was a tempting or making threats to carry on violence against her partner   ED Results / Procedures / Treatments   Labs (all labs ordered are listed, but only abnormal results are displayed) Labs Reviewed  COMPREHENSIVE METABOLIC PANEL - Abnormal; Notable for the following components:      Result Value   Potassium 2.7 (*)    Chloride 115 (*)    CO2 17 (*)    Calcium 8.4 (*)    All other components within normal limits  HCG, QUANTITATIVE, PREGNANCY - Abnormal; Notable for the following components:   hCG, Beta Chain, Quant, S 6 (*)    All other components within normal limits  SALICYLATE LEVEL -  Abnormal; Notable for the following components:   Salicylate Lvl [id number] (*)    All other components within normal limits  ACETAMINOPHEN LEVEL - Abnormal; Notable for the following components:   Acetaminophen (Tylenol), Serum <10 (*)    All other components within normal limits  ETHANOL - Abnormal; Notable for the following components:   Alcohol, Ethyl (B) 40 (*)    All other components within normal limits  CBC  LIPASE, BLOOD  TOPIRAMATE LEVEL  URINE DRUG SCREEN, QUALITATIVE ([facility] ONLY)  HIV ANTIBODY (ROUTINE TESTING W REFLEX)  CBC  BASIC METABOLIC PANEL  MAGNESIUM  PHOSPHORUS     EKG  EKG interpreted by me at 2050 heart rate 80 QRS 100 QTc 470 Normal sinus rhythm, normal interventricular conduction with elevated R waves noted in V2.  aVR does not show terminal R wave elevation.  No evidence of acute ischemic changes noted however.  May be representative of old anteroseptal infarct though this seems unlikely.  Compared with previous EKG from November 26, 2021, QT interval appears mildly prolonged and initially  up to 6 going R waves in V2 is notably changed from previous.    PROCEDURES:  Critical Care performed: Yes, see critical care procedure note(s)  Procedures   MEDICATIONS ORDERED IN ED: Medications  potassium chloride 10 mEq in 100 mL IVPB (10 mEq Intravenous New Bag/Given 12/27/22 2325)  0.9 %  sodium chloride infusion (has no administration in time range)  norepinephrine (LEVOPHED) 63m in 2547m(0.016 mg/mL) premix infusion (has no administration in time range)  docusate sodium (COLACE) capsule 100 mg (has no administration in time range)  polyethylene glycol (MIRALAX / GLYCOLAX) packet 17 g (has no administration in time range)  sodium chloride 0.9 % bolus 1,000 mL (0 mLs Intravenous Stopped 12/27/22 2252)  sodium chloride 0.9 % bolus 1,000 mL (1,000 mLs Intravenous New Bag/Given 12/27/22 2249)     IMPRESSION / MDM / ASSESSMENT AND PLAN / ED COURSE  I reviewed the triage vital signs and the nursing notes.                              Differential diagnosis includes, but is not limited to,   Patient's presentation is most consistent with acute presentation with potential threat to life or bodily function.   The patient is on the cardiac monitor to evaluate for evidence of arrhythmia and/or significant heart rate changes.  The patient currently denies intent to harm others or self, she also does not recall the events well.  Seems she has some amnesia to some of the events tonight.  I have placed under IVC though based on reported by EMS that the patient was showing some sort of homicidal type behavior. She also seems to have high potential for a potential overdose or ingestion, reviewed her medications discussed with poison control.  The patient developed increasing hypotension and increasing lethargy during ED stay.  Still alert to voice and interacts, but seems progressively somnolent.  Will admit to the intensive care unit for ongoing care, fluid bolus she does show blood pressure  responsiveness, and given her symptomatology and hypotension no will give additional fluid bolus replete potassium and ICU team planning to start peripheral pressor support  No one obvious toxidrome.  Blood pressure improving with current interventions.  Patient admitted to the ICU team.  Notified ICU team patient will have a pending CT of the head at this time given the somewhat unclear  context though no clear evidence of trauma or injury no obvious central neurologic findings other than somnolence.         FINAL CLINICAL IMPRESSION(S) / ED DIAGNOSES   Final diagnoses:  Alcohol abuse  Overdose of undetermined intent, initial encounter  Elevated serum hCG     Rx / DC Orders   ED Discharge Orders     None        Note:  This document was prepared using Dragon voice recognition software and may include unintentional dictation errors.   Delman Kitten, MD 12/27/22 [phone]

## 2022-12-27 NOTE — ED Notes (Signed)
IVC/pending psych consulut

## 2022-12-27 NOTE — Progress Notes (Signed)
An USGPIV (ultrasound guided PIV) has been placed for short-term vasopressor infusion. A correctly placed ivWatch must be used when administering Vasopressors. Should this treatment be needed beyond 72 hours, central line access should be obtained.  It will be the responsibility of the bedside nurse to follow best practice to prevent extravasations.   

## 2022-12-27 NOTE — ED Notes (Signed)
Patient transported to CT at this time. 

## 2022-12-27 NOTE — ED Notes (Addendum)
Possible ingestion of Seroquel, Zoloft, tramadol, clonazepam, topiramate and ETOH ingestion reported to poison control. Recommendations: Observe for CNS depression e.g.- depressed respirations, anticholinergic effects e.g. urinary retention, decreased bowel sounds, observe for seizure activity, hyperreflexia and clonus. Observe for QRS and QTC prolongation. For QRS >140 give 1-2 mEq/kg sodium bicarb IV push. For Qtc >500 treat with potassium and magnesium. Give supportive care. If extended release medication obtain EKG 12 hours post- ingestion. Get EKG 6-8 hours post ingestion and an EKG prior to discharge/clearance.

## 2022-12-27 NOTE — ED Notes (Signed)
Normal saline bolus infusing with pressure bag at this time. Supportive oxygen applied at 2 liter via [place] for sats of 91-93%

## 2022-12-27 NOTE — ED Notes (Signed)
Patient moved to ED25 at this time, assumed care.

## 2022-12-27 NOTE — ED Triage Notes (Addendum)
Per EMS, [facility] department called ems- pt was reportedly very aggressive with police and was wielding a bb gun and cleaver against husband and expressing SI. Pt was combative with ems- was given 5 of haldol, 4 Zofran, and 10 of versed en route. Pt calm and cooperative on arrival. Per EMS, bottles of Seroquel, Zoloft, tramadol, clonazepam, and topiramate found taken in unknown amounts. Pt denies taking more than prescribed meds today, states she had half a glass of alcohol. Pt is alert, oriented to self and situation at this time. Bandage on right hand noted- EMS state it is from previous infection. Pt reports she had intent to hurt others and herself today due to increased depression. Pt c/o headache and fatigue only at this time, denies other pain/S/S.   BGL- 156 Vitals WNL HR 86 97% on RA

## 2022-12-27 NOTE — ED Notes (Signed)
IV team at bedside 

## 2022-12-28 DIAGNOSIS — F332 Major depressive disorder, recurrent severe without psychotic features: Secondary | ICD-10-CM | POA: Diagnosis present

## 2022-12-28 LAB — BASIC METABOLIC PANEL
Anion gap: 3 — ABNORMAL LOW (ref 5–15)
BUN: 9 mg/dL (ref 6–20)
CO2: 19 mmol/L — ABNORMAL LOW (ref 22–32)
Calcium: 7.6 mg/dL — ABNORMAL LOW (ref 8.9–10.3)
Chloride: 117 mmol/L — ABNORMAL HIGH (ref 98–111)
Creatinine, Ser: 0.66 mg/dL (ref 0.44–1.00)
GFR, Estimated: >60 mL/min (ref >60)
Glucose, Bld: 98 mg/dL (ref 70–99)
Potassium: 4 mmol/L (ref 3.5–5.1)
Sodium: 139 mmol/L (ref 135–145)

## 2022-12-28 LAB — URINE DRUG SCREEN, QUALITATIVE (ARMC ONLY)
Amphetamines, Ur Screen: NOT DETECTED
Barbiturates, Ur Screen: NOT DETECTED
Benzodiazepine, Ur Scrn: POSITIVE — AB
Cannabinoid 50 Ng, Ur ~~LOC~~: NOT DETECTED
Cocaine Metabolite,Ur ~~LOC~~: NOT DETECTED
MDMA (Ecstasy)Ur Screen: NOT DETECTED
Methadone Scn, Ur: NOT DETECTED
Opiate, Ur Screen: NOT DETECTED
Phencyclidine (PCP) Ur S: NOT DETECTED
Tricyclic, Ur Screen: POSITIVE — AB

## 2022-12-28 LAB — MAGNESIUM: Magnesium: 1.9 mg/dL (ref 1.7–2.4)

## 2022-12-28 LAB — PHOSPHORUS: Phosphorus: 3.9 mg/dL (ref 2.5–4.6)

## 2022-12-28 LAB — RESP PANEL BY RT-PCR (RSV, FLU A&B, COVID)  RVPGX2
Influenza A by PCR: NEGATIVE
Influenza B by PCR: NEGATIVE
Resp Syncytial Virus by PCR: NEGATIVE
SARS Coronavirus 2 by RT PCR: NEGATIVE

## 2022-12-28 LAB — CBC
HCT: 32.9 % — ABNORMAL LOW (ref 36.0–46.0)
Hemoglobin: 10.6 g/dL — ABNORMAL LOW (ref 12.0–15.0)
MCH: 29.5 pg (ref 26.0–34.0)
MCHC: 32.2 g/dL (ref 30.0–36.0)
MCV: 91.6 fL (ref 80.0–100.0)
Platelets: 149 10*3/uL — ABNORMAL LOW (ref 150–400)
RBC: 3.59 MIL/uL — ABNORMAL LOW (ref 3.87–5.11)
RDW: 14.3 % (ref 11.5–15.5)
WBC: 4.4 10*3/uL (ref 4.0–10.5)
nRBC: 0 % (ref 0.0–0.2)

## 2022-12-28 LAB — HIV ANTIBODY (ROUTINE TESTING W REFLEX): HIV Screen 4th Generation wRfx: NONREACTIVE

## 2022-12-28 LAB — ACETAMINOPHEN LEVEL: Acetaminophen (Tylenol), Serum: <10 ug/mL — ABNORMAL LOW (ref 10–30)

## 2022-12-28 MED ORDER — POTASSIUM CHLORIDE CRYS ER 20 MEQ PO TBCR
60.0000 meq | EXTENDED_RELEASE_TABLET | Freq: Once | ORAL | Status: AC
  Administered 2022-12-28: 60 meq via ORAL
  Filled 2022-12-28: qty 3

## 2022-12-28 MED ORDER — LACTATED RINGERS IV SOLN: INTRAVENOUS | Status: DC

## 2022-12-28 MED ORDER — LACTATED RINGERS IV BOLUS
1000.0000 mL | Freq: Once | INTRAVENOUS | Status: AC
  Administered 2022-12-28: 1000 mL via INTRAVENOUS

## 2022-12-28 NOTE — ED Notes (Signed)
Pt moved from room 25 to room 20, pt requesting to keep her neck pillow, pt had a neck surgery states that she is unable to sleep comfortably without it. Neck pillow doesn't have any tie or clamps. Allowed pt to have neck pillow at this time.

## 2022-12-28 NOTE — ED Provider Notes (Signed)
Emergency Medicine Observation Re-evaluation Note  Chelsea Stanley is a 57 y.o. female, seen on rounds today.  Pt initially presented to the ED for complaints of Drug Overdose, Suicidal, and Aggressive Behavior  Currently, the patient is calm, no acute complaints.  Physical Exam  Blood pressure 99/60, pulse (!) 53, temperature 97.7 F (36.5 C), temperature source Oral, resp. rate 14, SpO2 93 %. Physical Exam General: NAD Lungs: CTAB Psych: not agitated  ED Course / MDM  EKG:    I have reviewed the labs performed to date as well as medications administered while in observation.  Recent changes in the last 24 hours include no acute events overnight.  Seen by ICU team this morning, noted to be awake alert, normal vital signs, not requiring supplemental oxygen.  Medically clear to proceed with psychiatry evaluation and disposition.  Plan  Current plan is for psychiatry admission. Patient is under full IVC at this time.   [name] Mew, MD 12/28/22 1000

## 2022-12-28 NOTE — Consult Note (Addendum)
[facility] Face-to-Face Psychiatry Consult   Reason for Consult:  Psychiatric evaluation Referring Physician:  Dr. Jacqualine Code Patient Identification: Chelsea Stanley MRN:  [id number] Principal Diagnosis: Intentional drug overdose [facility]) Diagnosis:  Principal Problem:   Intentional drug overdose ([place])   Total Time spent with patient: 20 minutes  Subjective:  "Aggressive with police, and taken an unknown amount of medication"   HPI:  Chelsea Stanley, 57 y.o., female patient seen by this provider; chart reviewed and consulted with EDP on 12/28/22.  Patient is not a great historian during the assessment.  Decision to make inpatient is derived from IVC and chart review.  Per chart review, triage nurse states, "Chelsea Spillers" Stanley is a 57 year old, English speaking, white female with a history of a mood disorder. Pt presented to [facility] ED under IVC due to being aggressive with law enforcement and endorsing suicide. Upon assessment, pt. was lethargic but agreed to participate in the assessment. Per patient report, her main behaviors stemmed from being in a bad mood. Pt was guarded, refusing to provide details of the disagreement with her husband prior to arrival. Pt would not identify why she'd become dysregulated. When asked about being depressed, pt denied having depression, explaining that she gets that way sometimes but not currently. Pt reported that she is not connected to any mental health services. Pt denied having a mental health diagnosis and refused to respond when asked about medications. Pt had poor insight and judgement. Pt's thoughts were relevant and linear. Pt did not appear to be responding to internal stimuli. Pt presented with an appropriate mood and a congruent affect. Pt had a bizarre appearance and refused to open her eyes throughout the assessment. The patient denied current SI, HI or AV/H. Pt's BAL was 41; UDS was unremarkable.    Per TTS, Upon assessment, pt. was lethargic but agreed to  participate in the assessment. Per patient report, her main behaviors stemmed from being in a bad mood. Pt was guarded, refusing to provide details of the disagreement with her husband prior to arrival. Pt would not identify why she'd become dysregulated. When asked about being depressed, pt denied having depression, explaining that she gets that way sometimes but not currently. Pt reported that she is not connected to any mental health services. Pt denied having a mental health diagnosis and refused to respond when asked about medications.    Recommendation:  Psychiatric inpatient hospitalization when medically cleared.  Past Psychiatric History:  Risk to Self:   Risk to Others:   Prior Inpatient Therapy:   Prior Outpatient Therapy:    Past Medical History:  Past Medical History:  Diagnosis Date   Depression     Past Surgical History:  Procedure Laterality Date   BREAST SURGERY     INCISION AND DRAINAGE ABSCESS Right 11/26/2021   Procedure: INCISION AND DEBRIDEMENT RIGHT THUMB, WRIST, AND FOREARM;  Surgeon: Thornton Park, MD;  Location: [facility] ORS;  Service: Orthopedics;  Laterality: Right;   Family History: History reviewed. No pertinent family history. Family Psychiatric  History:  Social History:  Social History   Substance and Sexual Activity  Alcohol Use Yes     Social History   Substance and Sexual Activity  Drug Use Not on file    Social History   Socioeconomic History   Marital status: Single    Spouse name: Not on file   Number of children: Not on file   Years of education: Not on file   Highest education level: Not on file  Occupational  History   Not on file  Tobacco Use   Smoking status: Never   Smokeless tobacco: Never  Substance and Sexual Activity   Alcohol use: Yes   Drug use: Not on file   Sexual activity: Not on file  Other Topics Concern   Not on file  Social History Narrative   Not on file   Social Determinants of Health   Financial  Resource Strain: Not on file  Food Insecurity: Not on file  Transportation Needs: Not on file  Physical Activity: Not on file  Stress: Not on file  Social Connections: Not on file   Additional Social History:    Allergies:   Allergies  Allergen Reactions   Clindamycin/Lincomycin Anaphylaxis   Peach [Prunus Persica] Anaphylaxis    Any fuzzy fruit   Penicillins Anaphylaxis    Has patient had a PCN reaction causing immediate rash, facial/tongue/throat swelling, SOB or lightheadedness with hypotension: Yes Has patient had a PCN reaction causing severe rash involving mucus membranes or skin necrosis: Yes Has patient had a PCN reaction that required hospitalization: Yes Has patient had a PCN reaction occurring within the last 10 years: No If all of the above answers are "NO", then may proceed with Cephalosporin use.    Strawberry (Diagnostic) Anaphylaxis   Honey Bee Venom [Bee Venom]    Peanut-Containing Drug Products Swelling   Benadryl [Diphenhydramine] Hives, Anxiety and Palpitations   Latex Rash   Levofloxacin Rash   Sulfa Antibiotics Rash   Vancomycin Rash   Voltaren [Diclofenac] Rash    Labs:  Results for orders placed or performed during the hospital encounter of 12/27/22 (from the past 48 hour(s))  CBC     Status: None   Collection Time: 12/27/22  9:03 PM  Result Value Ref Range   WBC 4.9 4.0 - 10.5 K/uL   RBC 4.28 3.87 - 5.11 MIL/uL   Hemoglobin 12.4 12.0 - 15.0 g/dL   HCT 38.2 36.0 - 46.0 %   MCV 89.3 80.0 - 100.0 fL   MCH 29.0 26.0 - 34.0 pg   MCHC 32.5 30.0 - 36.0 g/dL   RDW 14.2 11.5 - 15.5 %   Platelets 185 150 - 400 K/uL   nRBC 0.0 0.0 - 0.2 %    Comment: Performed at [facility], [address] [place], [place] [address]  Comprehensive metabolic panel     Status: Abnormal   Collection Time: 12/27/22  9:03 PM  Result Value Ref Range   Sodium 141 135 - 145 mmol/L   Potassium 2.7 (LL) 3.5 - 5.1 mmol/L    Comment: CRITICAL RESULT CALLED TO,  READ BACK BY AND VERIFIED WITH ALEXANDRA OLIVER AT 2151 12/27/2022 DLB    Chloride 115 (H) 98 - 111 mmol/L   CO2 17 (L) 22 - 32 mmol/L   Glucose, Bld 98 70 - 99 mg/dL    Comment: Glucose reference range applies only to samples taken after fasting for at least 8 hours.   BUN 8 6 - 20 mg/dL   Creatinine, Ser 0.72 0.44 - 1.00 mg/dL   Calcium 8.4 (L) 8.9 - 10.3 mg/dL   Total Protein 6.5 6.5 - 8.1 g/dL   Albumin 3.8 3.5 - 5.0 g/dL   AST 24 15 - 41 U/L   ALT 18 0 - 44 U/L   Alkaline Phosphatase 65 38 - 126 U/L   Total Bilirubin 0.6 0.3 - 1.2 mg/dL   GFR, Estimated >60 >60 mL/min    Comment: (NOTE) Calculated  using the CKD-EPI Creatinine Equation (2021)    Anion gap 9 5 - 15    Comment: Performed at [facility], [address] [place], [place] [address]  Lipase, blood     Status: None   Collection Time: 12/27/22  9:03 PM  Result Value Ref Range   Lipase 34 11 - 51 U/L    Comment: Performed at [facility], [address] [place], [place] [address]  hCG, quantitative, pregnancy     Status: Abnormal   Collection Time: 12/27/22  9:03 PM  Result Value Ref Range   hCG, Beta Chain, Quant, S 6 (H) <5 mIU/mL    Comment:          GEST. AGE      CONC.  (mIU/mL)   <=1 WEEK        5 - 50     2 WEEKS       50 - 500     3 WEEKS       100 - 10,000     4 WEEKS     1,000 - 30,000     5 WEEKS     3,500 - 115,000   6-8 WEEKS     12,000 - 270,000    12 WEEKS     15,000 - 220,000        FEMALE AND NON-PREGNANT FEMALE:     LESS THAN 5 mIU/mL Performed at [facility], [address] [place], [place] [id number]   Salicylate level     Status: Abnormal   Collection Time: 12/27/22  9:03 PM  Result Value Ref Range   Salicylate Lvl [id number] (L) 7.0 - 30.0 mg/dL    Comment: Performed at [facility], [address] [place], [place] [address]  Acetaminophen level     Status: Abnormal   Collection Time: 12/27/22  9:03 PM  Result Value Ref Range   Acetaminophen  (Tylenol), Serum <10 (L) 10 - 30 ug/mL    Comment: (NOTE) Therapeutic concentrations vary significantly. A range of 10-30 ug/mL  may be an effective concentration for many patients. However, some  are best treated at concentrations outside of this range. Acetaminophen concentrations >150 ug/mL at 4 hours after ingestion  and >50 ug/mL at 12 hours after ingestion are often associated with  toxic reactions.  Performed at [facility], [address] [place], [place] [address]   Ethanol     Status: Abnormal   Collection Time: 12/27/22  9:03 PM  Result Value Ref Range   Alcohol, Ethyl (B) 40 (H) <10 mg/dL    Comment: (NOTE) Lowest detectable limit for serum alcohol is 10 mg/dL.  For medical purposes only. Performed at [facility], [address] [place],  [address]     Current Facility-Administered Medications  Medication Dose Route Frequency Provider Last Rate Last Admin   0.9 %  sodium chloride infusion  250 mL Intravenous Continuous Lang Snow, NP   Stopped at 12/28/22 [id number]   docusate sodium (COLACE) capsule 100 mg  100 mg Oral BID PRN Lang Snow, NP       norepinephrine (LEVOPHED) 40m in 2527m(0.016 mg/mL) premix infusion  2-10 mcg/min Intravenous Titrated Ouma, ElBing NeighborsNP       polyethylene glycol (MIRALAX / GLYCOLAX) packet 17 g  17 g Oral Daily PRN OuLang SnowNP       Current Outpatient Medications  Medication Sig Dispense Refill   acetaminophen (TYLENOL)  500 MG tablet Take 2 tablets (1,000 mg total) by mouth every 8 (eight) hours. 30 tablet 0   Butalbital-APAP-Caffeine 50-325-40 MG capsule Take 1 capsule by mouth every 4 (four) hours as needed.     cefpodoxime (VANTIN) 200 MG tablet Take 1 tablet (200 mg total) by mouth 2 (two) times daily with a meal. 23 tablet 0   clonazePAM (KLONOPIN) 0.5 MG tablet Take 3 tablets by mouth at bedtime.     Melatonin 5 MG TABS Take 5 mg by mouth at bedtime as  needed.     mirtazapine (REMERON) 15 MG tablet Take 15 mg by mouth at bedtime.     ondansetron (ZOFRAN) 4 MG tablet Take 1 tablet (4 mg total) by mouth every 6 (six) hours as needed for nausea. 20 tablet 0   oxymetazoline (AFRIN) 0.05 % nasal spray Place 1 spray into both nostrils 2 (two) times daily as needed for congestion.     polyethylene glycol (MIRALAX / GLYCOLAX) packet Take 17 g by mouth every other day.     promethazine (PHENERGAN) 12.5 MG tablet Take 12.5 mg by mouth every 6 (six) hours as needed for nausea or vomiting.     sertraline (ZOLOFT) 50 MG tablet Take 50 mg by mouth at bedtime.     topiramate (TOPAMAX) 25 MG tablet Take 75 mg by mouth daily.     traZODone (DESYREL) 100 MG tablet Take 100 mg by mouth daily.      Musculoskeletal: Strength & Muscle Tone: within normal limits Gait & Station: normal Patient leans: N/A            Psychiatric Specialty Exam:  Presentation  General Appearance:  Appropriate for Environment  Eye Contact: Absent  Speech: Blocked  Speech Volume: Decreased  Handedness: Right   Mood and Affect  Mood: Dysphoric  Affect: Congruent   Thought Process  Thought Processes: Disorganized  Descriptions of Associations:Circumstantial  Orientation:Partial  Thought Content:-- (unable to assess)  History of Schizophrenia/Schizoaffective disorder:-- (unable to assess)  Duration of Psychotic Symptoms:No data recorded Hallucinations:No data recorded Ideas of Reference:No data recorded Suicidal Thoughts:Suicidal Thoughts: Yes, Active  Homicidal Thoughts:No data recorded  Sensorium  Memory:No data recorded Judgment:No data recorded Insight:No data recorded  Executive Functions  Concentration:No data recorded Attention Span:No data recorded Recall:No data recorded Fund of Knowledge:No data recorded Language:No data recorded  Psychomotor Activity  Psychomotor Activity:No data recorded  Assets  Assets:No data  recorded  Sleep  Sleep: Sleep: Poor   Physical Exam: Physical Exam ROS Blood pressure 101/62, pulse 70, temperature 97.9 F (36.6 C), temperature source Axillary, resp. rate 14, SpO2 97 %. There is no height or weight on file to calculate BMI.  Treatment Plan Summary: Daily contact with patient to assess and evaluate symptoms and progress in treatment and Medication management  Disposition: Recommend psychiatric Inpatient admission when medically cleared. Supportive therapy provided about ongoing stressors. Discussed crisis plan, support from social network, calling 911, coming to the Emergency Department, and calling Suicide Hotline.  Deloria Lair, NP 12/28/2022 3:21 AM

## 2022-12-28 NOTE — ED Notes (Signed)
Patient more responsive at this time, able to have a conversation. Still unable to answer orientation questions r/t time and situation.

## 2022-12-28 NOTE — ED Notes (Signed)
Spoke with poison control at this time. States to continue plan of care and to call back if needed.

## 2022-12-28 NOTE — ED Notes (Signed)
Fluids and O2 stopped at this time per MD.

## 2022-12-28 NOTE — ED Notes (Signed)
Patient complains of burning at 20G left hand IV site. Site assessed, no phlebitis, patent. Patient educated.

## 2022-12-28 NOTE — ED Notes (Signed)
61m urine on bladder scan. OStark Klein NP aware.

## 2022-12-28 NOTE — ED Notes (Signed)
Poison control contacted this RN, aware of pt's status. Recommended repeat acetaminophen level and EKG @ 0500. NP messaged.

## 2022-12-28 NOTE — ED Notes (Signed)
Pt screaming in room. RN observed pt having intense panic attack related to outbursts from another pt. Pt shaking, screaming, tachypneic, unable to be redirected, and in the fetal position. Therapeutic communication used - after much coaching and redirection, pt is able to converse in a normal fashion with normalized respiratory rate.

## 2022-12-28 NOTE — H&P (Addendum)
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  Per ED notes, EMS reported that the [facility] department called for patient reportedly very aggressive with police and was wielding a bb gun and cleaver against husband and expressing SI. Pt was combative with EMS and was given 5 of Haldol, 74m of Zofran, and a total of 151mof versed en route. Per EMS, bottles of Seroquel, Zoloft, tramadol, clonazepam, and topiramate found taken in unknown amounts. Pt denies taking more than prescribed meds today, states she had half a glass of alcohol. Pt reports she had intent to hurt others and herself today due to increased depression    ED Course: Initial vital signs showed HR of beats/minute, BP mm Hg, the RR 30 breaths/minute, and the oxygen saturation % on and a temperature of 98.38F (36.9C). Patient was alert and oriented x 4 and denied trying to harm herself or others. Patient was placed under IVC due to being aggressive with law enforcement and endorsing suicide. Psychiatry was consulted and recommended psychiatric inpatient admission once medically cleared. She admitted by psych but shortly after patient was noted to be more lethargic and hypotensive. Patient given IVFs fluids but remained hypotensive despite IVF boluses therefore was started on Levophed. PCCM consulted.  Pertinent Labs/Diagnostics Findings: Chemistry:Na+/ K+: 141/2.7  CBC: unremarkable Other Lab findings: Ethanol level 40, UDS pending Imaging: CTH> negative Medications Administered: Potassium chloride, NS bolus  Past Medical History     Significant Hospital Events   2/10:Admitted to stepdown with Altered Mental status in the setting of Suspected Acute Drug Overdose with Suicide Intent  Consults:  Psych  Significant Diagnostic Tests:  2/10: Noncontrast CT head>No acute intracranial abnormality.   Micro Data:  None  Antimicrobials:  None  OBJECTIVE  Blood pressure 101/62, pulse 70, temperature 97.9 F (36.6 C), temperature source Axillary, resp. rate 14, SpO2 97 %.        Intake/Output Summary (Last 24 hours) at 12/28/2022 0258 Last data filed at 12/27/2022 2252 Gross per 24 hour  Intake 999 ml  Output --  Net 999 ml   There were no vitals filed for this visit.   Physical Examination  GENERAL:57year-old critically ill patient lying in the bed with no acute distress.  EYES: Pupils equal, round, reactive to light and accommodation. No scleral icterus. Extraocular muscles intact.  HEENT: Head atraumatic, normocephalic. Oropharynx and nasopharynx clear.  NECK:  Supple, no jugular venous distention. No thyroid enlargement, no tenderness.  LUNGS: Normal breath sounds bilaterally, no wheezing, rales,rhonchi or crepitation. No use of accessory muscles of respiration.  CARDIOVASCULAR: S1, S2 normal. No murmurs, rubs, or gallops.  ABDOMEN: Soft, nontender, nondistended. Bowel sounds present. No organomegaly or mass.  EXTREMITIES:atraumatic. No swelling or erythema. Capillary refill > 3 seconds in all extremities. Pulses palpable distally. NEUROLOGIC:The patient is awake, alert and oriented to person, place, and time with normal speech. Motor function is normal with muscle strength 5/5 bilaterally to upper and lower extremities. Sensation is intact bilaterally.  Cranial nerves are intact. Gait deferred.  PSYCHIATRIC: Appropriate effect SKIN: No obvious rash, lesion, or ulcer.   Labs/imaging that  I havepersonally reviewed  (right click and "Reselect all SmartList Selections" daily)     Labs   CBC: Recent  Labs  Lab 12/27/22 2103  WBC 4.9  HGB 12.4  HCT 38.2  MCV 89.3  PLT [id number]    Basic Metabolic Panel: Recent Labs  Lab 12/27/22 2103  NA 141  K 2.7*  CL 115*  CO2 17*  GLUCOSE 98  BUN 8  CREATININE 0.72  CALCIUM 8.4*   GFR: CrCl cannot be calculated (Unknown ideal weight.). Recent Labs  Lab 12/27/22 2103  WBC 4.9    Liver Function Tests: Recent Labs  Lab 12/27/22 2103  AST 24  ALT 18  ALKPHOS 65  BILITOT 0.6  PROT 6.5  ALBUMIN 3.8   Recent Labs  Lab 12/27/22 2103  LIPASE 34   No results for input(s): "AMMONIA" in the last 168 hours.  ABG No results found for: "PHART", "PCO2ART", "PO2ART", "HCO3", "TCO2", "ACIDBASEDEF", "O2SAT"   Coagulation Profile: No results for input(s): "INR", "PROTIME" in the last 168 hours.  Cardiac Enzymes: No results for input(s): "CKTOTAL", "CKMB", "CKMBINDEX", "TROPONINI" in the last 168 hours.  HbA1C: No results found for: "HGBA1C"  CBG: No results for input(s): "GLUCAP" in the last 168 hours.  Review of Systems:   PATIENT UNWILLING TO PROVIDE HISTORY  Past Medical History  She,  has a past medical history of Depression.   Surgical History    Past Surgical History:  Procedure Laterality Date   BREAST SURGERY     INCISION AND DRAINAGE ABSCESS Right 11/26/2021   Procedure: INCISION AND DEBRIDEMENT RIGHT THUMB, WRIST, AND FOREARM;  Surgeon: Thornton Park, MD;  Location: [facility] ORS;  Service: Orthopedics;  Laterality: Right;     Social History   reports that she has never smoked. She has never used smokeless tobacco. She reports current alcohol use.   Family History   Her family history is not on file.   Allergies Allergies  Allergen Reactions   Clindamycin/Lincomycin Anaphylaxis   Peach [Prunus Persica] Anaphylaxis    Any fuzzy fruit   Penicillins Anaphylaxis    Has patient had a PCN reaction causing immediate rash, facial/tongue/throat swelling, SOB or lightheadedness with hypotension: Yes Has patient  had a PCN reaction causing severe rash involving mucus membranes or skin necrosis: Yes Has patient had a PCN reaction that required hospitalization: Yes Has patient had a PCN reaction occurring within the last 10 years: No If all of the above answers are "NO", then may proceed with Cephalosporin use.    Strawberry (Diagnostic) Anaphylaxis   Honey Bee Venom [Bee Venom]    Peanut-Containing Drug Products Swelling   Benadryl [Diphenhydramine] Hives, Anxiety and Palpitations   Latex Rash   Levofloxacin Rash   Sulfa Antibiotics Rash   Vancomycin Rash   Voltaren [Diclofenac] Rash     Home Medications  Prior to Admission medications   Medication Sig Start Date End Date Taking? Authorizing Provider  acetaminophen (TYLENOL) 500 MG tablet Take 2 tablets (1,000 mg total) by mouth every 8 (eight) hours. 11/28/21   Danford, Suann Larry, MD  Butalbital-APAP-Caffeine [phone] MG capsule Take 1 capsule by mouth every 4 (four) hours as needed.    [provider]  cefpodoxime (VANTIN) 200 MG tablet Take 1 tablet (200 mg total) by mouth 2 (two) times daily with a meal. 11/28/21   Danford, Suann Larry, MD  clonazePAM (KLONOPIN) 0.5 MG tablet Take 3 tablets by mouth at bedtime.    [provider]  Melatonin 5  MG TABS Take 5 mg by mouth at bedtime as needed.    [provider]  mirtazapine (REMERON) 15 MG tablet Take 15 mg by mouth at bedtime.    [provider]  ondansetron (ZOFRAN) 4 MG tablet Take 1 tablet (4 mg total) by mouth every 6 (six) hours as needed for nausea. 11/28/21   Danford, Suann Larry, MD  oxymetazoline (AFRIN) 0.05 % nasal spray Place 1 spray into both nostrils 2 (two) times daily as needed for congestion.    [provider]  polyethylene glycol (MIRALAX / GLYCOLAX) packet Take 17 g by mouth every other day.    [provider]  promethazine (PHENERGAN) 12.5 MG tablet Take 12.5 mg by mouth every 6 (six) hours as needed for nausea or  vomiting.    [provider]  sertraline (ZOLOFT) 50 MG tablet Take 50 mg by mouth at bedtime.    [provider]  topiramate (TOPAMAX) 25 MG tablet Take 75 mg by mouth daily.    [provider]  traZODone (DESYREL) 100 MG tablet Take 100 mg by mouth daily. 09/22/16   [provider]  Scheduled Meds: Continuous Infusions:  sodium chloride Stopped (12/28/22 0307)   norepinephrine (LEVOPHED) Adult infusion     PRN Meds:.docusate sodium, polyethylene glycol  Active Hospital Problem list     Assessment & Plan:   #Toxic Metabolic Encephalopathy #Suspected Acute Drug Overdose with Suicide Intent Suspected of ingesting unknown amount of Seroquel, Zoloft, tramadol, clonazepam, topiramate and ETOH   - Follow CBC, CMP (check anion gap, organ injury), VBG/Lactate If hypoxic or sedated (check for carboxy and methemoglobinema) - Acetaminophen level [id number], Salicylate level <7, EtOH  level 40  - Urinalysis + Urine Tox pending - Serial EKG (check prolongd QRS, QT) - CTH negative - seizure precautions - monitor CBG q 4 - Psychiatry consult  - Continuous observation / Suicide precautions/IVC - Poison Control([phone]) notified with recommendations: Observe for CNS depression e.g.- depressed respirations, anticholinergic effects e.g. urinary retention, decreased bowel sounds, observe for seizure activity, hyperreflexia and clonus. Observe for QRS and QTC prolongation. For QRS >140 give 1-2 mEq/kg sodium bicarb IV push. For Qtc >500 treat with potassium and magnesium. Give supportive care. If extended release medication obtain EKG 12 hours post- ingestion. Get EKG 6-8 hours post ingestion and an EKG prior to discharge/clearance  #Hypotension Likely in the setting of above Favor medication related hypotension rather than septic. - Supplemental oxygen as needed, to maintain SpO2 > 90% - IVF hydration as needed - Pressors for MAP goal >65 - Strict I/O's - Hold  home meds for now  #Hx of Seizures Per [facility], seizures thought to be pseudoseizures. Has had multiple EEGs in the past which were all negative. Patient referred to Psychiatrist  - Hold home meds including Topiramate for now - Seizures precaution - CTH negative  #Anxiety and Depression - Hold home meds pending psych admission and medication adjustment  Best practice:  Diet:  NPO Pain/Anxiety/Delirium protocol (if indicated): No VAP protocol (if indicated): Not indicated DVT prophylaxis: SCD GI prophylaxis: N/A Glucose control:  SSI Yes Central venous access:  N/A Arterial line:  N/A Foley:  N/A Mobility:  bed rest  PT consulted: N/A Last date of multidisciplinary goals of care discussion [2/11] Code Status:  full code Disposition: Stepdown   = Goals of Care = Code Status Order: FULL  Primary Emergency Contact: Janek,Christopher, Home Phone: [phone] Wishes to pursue full aggressive treatment and intervention options, including CPR  and intubation, but goals of care will be addressed on going with family if that should become necessary.  Critical care time: 90 minutes        Rufina Falco DNP, CCRN, FNP-C, AGACNP-BC Acute Care & Family Nurse Practitioner [facility] PCCM on call pager [phone]

## 2022-12-28 NOTE — Consult Note (Signed)
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 Suspected of ingesting unknown amount of Seroquel, Zoloft, tramadol, clonazepam, topiramate and ETOH    Goal of Therapy:  WNL  Plan:  No replacement needed at this time.  F/u with AM labs.   Oswald Hillock ,PharmD Clinical Pharmacist 12/28/2022 8:35 AM

## 2022-12-28 NOTE — ED Notes (Signed)
Ronalee Belts with Poison Control called and updated at this time. Poison Control to close out case at this time.

## 2022-12-28 NOTE — ED Notes (Signed)
Pt has not urinated since coming to the ER, bladder scan showed 4ms, [place] NP notified and new orders placed for fluids. Pt's morning BMP has already been sent, will monitor for any significant creatinine changes per NP Ouma.

## 2022-12-28 NOTE — ED Notes (Signed)
Pt given a snack at this time.  

## 2022-12-28 NOTE — ED Notes (Signed)
Report received from Lindsay, RN

## 2022-12-28 NOTE — ED Notes (Signed)
Patient remains supine on stretcher with respirations even and non labored. Patient NAD, awakens to verbal. Patient remains within view of sitter.

## 2022-12-28 NOTE — Consult Note (Signed)
[facility] Face-to-Face Psychiatry Consult   Reason for Consult:  Psychiatric evaluation Referring Physician:  Dr. Jacqualine Code Patient Identification: Chelsea Stanley MRN:  [id number] Principal Diagnosis: Major depressive disorder, recurrent, severe without psychotic behavior ([place]) Diagnosis:  Principal Problem:   Major depressive disorder, recurrent, severe without psychotic behavior ([place]) Active Problems:   Intentional drug overdose ([place])   Total Time spent with patient: 45 minutes  Subjective:  "I got so upset that I lost it."  Chelsea Stanley is a 57 y.o. female patient brought into the ED with a suspected overdose on unknown substance(s) as she had empty bottles around and when the police arrived she was threatening her husband with a cleaver and a bb gun.  EMS had to provide agitation medications prior to arrival.  Today, patient was found resting in bed, alert and cooperative. When asked what brought her in, she said she had gotten upset with her boyfriend because she felt he was making fun of her. She also expressed frustration that she is unable to help out much around the house due to inability to use her right hand, and consequently feels she has no purpose. When answering questions about the event that brought her here, she stuttered and had difficulty expressing herself; speech was fluent and clear during the rest of the interview. She denied using substances or taking medications last night and endorsed having 2 alcoholic drinks, inconsistent with prior report of having a half a glass. Patient said she stopped seeing her psychiatrist 2-3 years ago, stating he betrayed her trust. 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 Her family lives in [place], but she endorsed having close friends in the area, as well as her boyfriend.   Patient minimizing her actions, continues to deny suicidal ideations  or intent and denied homicidal ideations. She denied recent changes in mood as well as auditory and visual hallucinations, but did endorse ongoing anxiety, stating she worries constantly. She presents with a cheerful affect that is incongruent with her situation. Patient minimizes recent overdose and threats to harm self and others. She expressed a desire to get psychotherapy.  Based on the severity of actions, she will ned to be psychiatrically admitted for stabilization.  HPI from NP, 12/28/22 per Anette Riedel, PMHNP:  Normajean Stanley, 57 y.o., female patient seen by this provider; chart reviewed and consulted with EDP on 12/28/22.  Patient is not a great historian during the assessment.  Decision to make inpatient is derived from IVC and chart review.  Per chart review, triage nurse states, "Chelsea Spillers" Stanley is a 57 year old, English speaking, white female with a history of a mood disorder. Pt presented to [facility] ED under IVC due to being aggressive with law enforcement and endorsing suicide. Upon assessment, pt. was lethargic but agreed to participate in the assessment. Per patient report, her main behaviors stemmed from being in a bad mood. Pt was guarded, refusing to provide details of the disagreement with her husband prior to arrival. Pt would not identify why she'd become dysregulated. When asked about being depressed, pt denied having depression, explaining that she gets that way sometimes but not currently. Pt reported that she is not connected to any mental health services. Pt denied having a mental health diagnosis and refused to respond when asked about medications. Pt had poor insight and judgement. Pt's thoughts were relevant and linear. Pt did not appear to be responding to  internal stimuli. Pt presented with an appropriate mood and a congruent affect. Pt had a bizarre appearance and refused to open her eyes throughout the assessment. The patient denied current SI, HI or AV/H. Pt's BAL was 41; UDS  was unremarkable.    Per TTS, Upon assessment, pt. was lethargic but agreed to participate in the assessment. Per patient report, her main behaviors stemmed from being in a bad mood. Pt was guarded, refusing to provide details of the disagreement with her husband prior to arrival. Pt would not identify why she'd become dysregulated. When asked about being depressed, pt denied having depression, explaining that she gets that way sometimes but not currently. Pt reported that she is not connected to any mental health services. Pt denied having a mental health diagnosis and refused to respond when asked about medications.    Recommendation:  Psychiatric inpatient hospitalization when medically cleared.  Past Psychiatric History:  Risk to Self:  yes Risk to Others:  not currently Prior Inpatient Therapy:  none Prior Outpatient Therapy:  none  Past Medical History:  Past Medical History:  Diagnosis Date   Depression     Past Surgical History:  Procedure Laterality Date   BREAST SURGERY     INCISION AND DRAINAGE ABSCESS Right 11/26/2021   Procedure: INCISION AND DEBRIDEMENT RIGHT THUMB, WRIST, AND FOREARM;  Surgeon: Thornton Park, MD;  Location: [facility] ORS;  Service: Orthopedics;  Laterality: Right;   Family History: History reviewed. No pertinent family history. Family Psychiatric  History:  Social History:  Social History   Substance and Sexual Activity  Alcohol Use Yes     Social History   Substance and Sexual Activity  Drug Use Not on file    Social History   Socioeconomic History   Marital status: Single    Spouse name: Not on file   Number of children: Not on file   Years of education: Not on file   Highest education level: Not on file  Occupational History   Not on file  Tobacco Use   Smoking status: Never   Smokeless tobacco: Never  Substance and Sexual Activity   Alcohol use: Yes   Drug use: Not on file   Sexual activity: Not on file  Other Topics Concern    Not on file  Social History Narrative   Not on file   Social Determinants of Health   Financial Resource Strain: Not on file  Food Insecurity: Not on file  Transportation Needs: Not on file  Physical Activity: Not on file  Stress: Not on file  Social Connections: Not on file   Additional Social History:    Allergies:   Allergies  Allergen Reactions   Clindamycin/Lincomycin Anaphylaxis   Peach [Prunus Persica] Anaphylaxis    Any fuzzy fruit   Penicillins Anaphylaxis    Has patient had a PCN reaction causing immediate rash, facial/tongue/throat swelling, SOB or lightheadedness with hypotension: Yes Has patient had a PCN reaction causing severe rash involving mucus membranes or skin necrosis: Yes Has patient had a PCN reaction that required hospitalization: Yes Has patient had a PCN reaction occurring within the last 10 years: No If all of the above answers are "NO", then may proceed with Cephalosporin use.    Strawberry (Diagnostic) Anaphylaxis   Honey Bee Venom [Bee Venom]    Peanut-Containing Drug Products Swelling   Benadryl [Diphenhydramine] Hives, Anxiety and Palpitations   Latex Rash   Levofloxacin Rash   Sulfa Antibiotics Rash   Vancomycin Rash  Voltaren [Diclofenac] Rash    Labs:  Results for orders placed or performed during the hospital encounter of 12/27/22 (from the past 48 hour(s))  CBC     Status: None   Collection Time: 12/27/22  9:03 PM  Result Value Ref Range   WBC 4.9 4.0 - 10.5 K/uL   RBC 4.28 3.87 - 5.11 MIL/uL   Hemoglobin 12.4 12.0 - 15.0 g/dL   HCT 38.2 36.0 - 46.0 %   MCV 89.3 80.0 - 100.0 fL   MCH 29.0 26.0 - 34.0 pg   MCHC 32.5 30.0 - 36.0 g/dL   RDW 14.2 11.5 - 15.5 %   Platelets 185 150 - 400 K/uL   nRBC 0.0 0.0 - 0.2 %    Comment: Performed at [facility], [address] [place], [place] [address]  Comprehensive metabolic panel     Status: Abnormal   Collection Time: 12/27/22  9:03 PM  Result Value Ref Range   Sodium  141 135 - 145 mmol/L   Potassium 2.7 (LL) 3.5 - 5.1 mmol/L    Comment: CRITICAL RESULT CALLED TO, READ BACK BY AND VERIFIED WITH ALEXANDRA OLIVER AT 2151 12/27/2022 DLB    Chloride 115 (H) 98 - 111 mmol/L   CO2 17 (L) 22 - 32 mmol/L   Glucose, Bld 98 70 - 99 mg/dL    Comment: Glucose reference range applies only to samples taken after fasting for at least 8 hours.   BUN 8 6 - 20 mg/dL   Creatinine, Ser 0.72 0.44 - 1.00 mg/dL   Calcium 8.4 (L) 8.9 - 10.3 mg/dL   Total Protein 6.5 6.5 - 8.1 g/dL   Albumin 3.8 3.5 - 5.0 g/dL   AST 24 15 - 41 U/L   ALT 18 0 - 44 U/L   Alkaline Phosphatase 65 38 - 126 U/L   Total Bilirubin 0.6 0.3 - 1.2 mg/dL   GFR, Estimated >60 >60 mL/min    Comment: (NOTE) Calculated using the CKD-EPI Creatinine Equation (2021)    Anion gap 9 5 - 15    Comment: Performed at [facility], [address] [place], [place] [address]  Lipase, blood     Status: None   Collection Time: 12/27/22  9:03 PM  Result Value Ref Range   Lipase 34 11 - 51 U/L    Comment: Performed at [facility], [address] [place], [place] [address]  hCG, quantitative, pregnancy     Status: Abnormal   Collection Time: 12/27/22  9:03 PM  Result Value Ref Range   hCG, Beta Chain, Quant, S 6 (H) <5 mIU/mL    Comment:          GEST. AGE      CONC.  (mIU/mL)   <=1 WEEK        5 - 50     2 WEEKS       50 - 500     3 WEEKS       100 - 10,000     4 WEEKS     1,000 - 30,000     5 WEEKS     3,500 - 115,000   6-8 WEEKS     12,000 - 270,000    12 WEEKS     15,000 - 220,000        FEMALE AND NON-PREGNANT FEMALE:     LESS THAN 5 mIU/mL Performed at [facility], [address], [place], [place] [address]  Salicylate level     Status: Abnormal   Collection Time: 12/27/22  9:03 PM  Result Value Ref Range   Salicylate Lvl [id number] (L) 7.0 - 30.0 mg/dL    Comment: Performed at [facility], [address] [place], [place] [address]  Acetaminophen level      Status: Abnormal   Collection Time: 12/27/22  9:03 PM  Result Value Ref Range   Acetaminophen (Tylenol), Serum <10 (L) 10 - 30 ug/mL    Comment: (NOTE) Therapeutic concentrations vary significantly. A range of 10-30 ug/mL  may be an effective concentration for many patients. However, some  are best treated at concentrations outside of this range. Acetaminophen concentrations >150 ug/mL at 4 hours after ingestion  and >50 ug/mL at 12 hours after ingestion are often associated with  toxic reactions.  Performed at [facility], [address] [place], [place] [address]   Ethanol     Status: Abnormal   Collection Time: 12/27/22  9:03 PM  Result Value Ref Range   Alcohol, Ethyl (B) 40 (H) <10 mg/dL    Comment: (NOTE) Lowest detectable limit for serum alcohol is 10 mg/dL.  For medical purposes only. Performed at [facility], [address] [place], [place] [address]   CBC     Status: Abnormal   Collection Time: 12/28/22  4:45 AM  Result Value Ref Range   WBC 4.4 4.0 - 10.5 K/uL   RBC 3.59 (L) 3.87 - 5.11 MIL/uL   Hemoglobin 10.6 (L) 12.0 - 15.0 g/dL   HCT 32.9 (L) 36.0 - 46.0 %   MCV 91.6 80.0 - 100.0 fL   MCH 29.5 26.0 - 34.0 pg   MCHC 32.2 30.0 - 36.0 g/dL   RDW 14.3 11.5 - 15.5 %   Platelets 149 (L) 150 - 400 K/uL   nRBC 0.0 0.0 - 0.2 %    Comment: Performed at [facility], [address], [place], [place] [id number]  Basic metabolic panel     Status: Abnormal   Collection Time: 12/28/22  4:45 AM  Result Value Ref Range   Sodium 139 135 - 145 mmol/L    Comment: ELECTROLYTES REPEATED TO VERIFY DLB   Potassium 4.0 3.5 - 5.1 mmol/L   Chloride 117 (H) 98 - 111 mmol/L   CO2 19 (L) 22 - 32 mmol/L   Glucose, Bld 98 70 - 99 mg/dL    Comment: Glucose reference range applies only to samples taken after fasting for at least 8 hours.   BUN 9 6 - 20 mg/dL   Creatinine, Ser 0.66 0.44 - 1.00 mg/dL   Calcium 7.6 (L) 8.9 - 10.3 mg/dL   GFR, Estimated  >60 >60 mL/min    Comment: (NOTE) Calculated using the CKD-EPI Creatinine Equation (2021)    Anion gap 3 (L) 5 - 15    Comment: Performed at [facility], [address], [place], [place] [address]  Magnesium     Status: None   Collection Time: 12/28/22  4:45 AM  Result Value Ref Range   Magnesium 1.9 1.7 - 2.4 mg/dL    Comment: Performed at [facility], [address] [place], [place] [address]  Phosphorus     Status: None   Collection Time: 12/28/22  4:45 AM  Result Value Ref Range   Phosphorus 3.9 2.5 - 4.6 mg/dL    Comment: Performed at [facility], [address], [place],  [address]  Acetaminophen level  Status: Abnormal   Collection Time: 12/28/22  4:45 AM  Result Value Ref Range   Acetaminophen (Tylenol), Serum <10 (L) 10 - 30 ug/mL    Comment: (NOTE) Therapeutic concentrations vary significantly. A range of 10-30 ug/mL  may be an effective concentration for many patients. However, some  are best treated at concentrations outside of this range. Acetaminophen concentrations >150 ug/mL at 4 hours after ingestion  and >50 ug/mL at 12 hours after ingestion are often associated with  toxic reactions.  Performed at [facility], [address], [place], [place] [address]     Current Facility-Administered Medications  Medication Dose Route Frequency Provider Last Rate Last Admin   0.9 %  sodium chloride infusion  250 mL Intravenous Continuous Lang Snow, NP   Stopped at 12/28/22 [id number]   docusate sodium (COLACE) capsule 100 mg  100 mg Oral BID PRN Lang Snow, NP       lactated ringers infusion   Intravenous Continuous Lang Snow, NP   Stopped at 12/28/22 0926   polyethylene glycol (MIRALAX / GLYCOLAX) packet 17 g  17 g Oral Daily PRN Lang Snow, NP       Current Outpatient Medications  Medication Sig Dispense Refill   acetaminophen (TYLENOL) 500 MG tablet Take 2 tablets  (1,000 mg total) by mouth every 8 (eight) hours. 30 tablet 0   Butalbital-APAP-Caffeine 50-325-40 MG capsule Take 1 capsule by mouth every 4 (four) hours as needed.     cefpodoxime (VANTIN) 200 MG tablet Take 1 tablet (200 mg total) by mouth 2 (two) times daily with a meal. 23 tablet 0   clonazePAM (KLONOPIN) 0.5 MG tablet Take 3 tablets by mouth at bedtime.     Melatonin 5 MG TABS Take 5 mg by mouth at bedtime as needed.     mirtazapine (REMERON) 15 MG tablet Take 15 mg by mouth at bedtime.     ondansetron (ZOFRAN) 4 MG tablet Take 1 tablet (4 mg total) by mouth every 6 (six) hours as needed for nausea. 20 tablet 0   oxymetazoline (AFRIN) 0.05 % nasal spray Place 1 spray into both nostrils 2 (two) times daily as needed for congestion.     polyethylene glycol (MIRALAX / GLYCOLAX) packet Take 17 g by mouth every other day.     promethazine (PHENERGAN) 12.5 MG tablet Take 12.5 mg by mouth every 6 (six) hours as needed for nausea or vomiting.     sertraline (ZOLOFT) 50 MG tablet Take 50 mg by mouth at bedtime.     topiramate (TOPAMAX) 25 MG tablet Take 75 mg by mouth daily.     traZODone (DESYREL) 100 MG tablet Take 100 mg by mouth daily.      Musculoskeletal: Strength & Muscle Tone: within normal limits Gait & Station: normal Patient leans: N/A  Psychiatric Specialty Exam: Physical Exam Vitals and nursing note reviewed.  Constitutional:      Appearance: Normal appearance.  HENT:     Head: Normocephalic.     Nose: Nose normal.  Pulmonary:     Effort: Pulmonary effort is normal.  Musculoskeletal:        General: Signs of injury present.     Cervical back: Normal range of motion.  Neurological:     General: No focal deficit present.     Mental Status: She is alert and oriented to person, place, and time.  Psychiatric:        Attention and Perception: Attention and perception normal.  Mood and Affect: Mood is anxious and depressed.        Speech: Speech normal.         Behavior: Behavior normal. Behavior is cooperative.        Thought Content: Thought content includes suicidal ideation. Thought content includes suicidal plan.        Cognition and Memory: Cognition and memory normal.        Judgment: Judgment normal.     Review of Systems  Psychiatric/Behavioral:  Positive for depression and suicidal ideas. The patient is nervous/anxious.   All other systems reviewed and are negative.   Blood pressure (!) 90/54, pulse (!) 56, temperature 97.7 F (36.5 C), temperature source Oral, resp. rate 17, SpO2 94 %.There is no height or weight on file to calculate BMI.  General Appearance: Fairly Groomed  Eye Contact:  Good  Speech:   Clear, coherent, accented, with stuttering at times  Volume:  Normal  Mood:   "Okay"  Affect:  Non-Congruent and Full Range  Thought Process:  Coherent and Linear  Orientation:  Full (Time, Place, and Person)  Thought Content:  Logical  Suicidal Thoughts:  overdosed  Homicidal Thoughts:  No  Memory:  Immediate;   Good Recent;   Fair Remote;   Fair  Judgement:  Fair  Insight:  Lacking  Psychomotor Activity:  Normal  Concentration:  Concentration: Good and Attention Span: Good  Recall:  [place] of Knowledge:  Good  Language:  Good  Akathisia:  Negative  Handed:  Right  AIMS (if indicated):     Assets:  Communication Skills Desire for Improvement Housing Social Support  ADL's:  Intact  Cognition:  WNL  Sleep:   not assessed      Physical Exam: Physical Exam Vitals and nursing note reviewed.  Constitutional:      Appearance: Normal appearance.  HENT:     Head: Normocephalic.     Nose: Nose normal.  Pulmonary:     Effort: Pulmonary effort is normal.  Musculoskeletal:        General: Signs of injury present.     Cervical back: Normal range of motion.  Neurological:     General: No focal deficit present.     Mental Status: She is alert and oriented to person, place, and time.  Psychiatric:         Attention and Perception: Attention and perception normal.        Mood and Affect: Mood is anxious and depressed.        Speech: Speech normal.        Behavior: Behavior normal. Behavior is cooperative.        Thought Content: Thought content includes suicidal ideation. Thought content includes suicidal plan.        Cognition and Memory: Cognition and memory normal.        Judgment: Judgment normal.    Review of Systems  Psychiatric/Behavioral:  Positive for depression and suicidal ideas. The patient is nervous/anxious.   All other systems reviewed and are negative.  Blood pressure (!) 90/54, pulse (!) 56, temperature 97.7 F (36.5 C), temperature source Oral, resp. rate 17, SpO2 94 %. There is no height or weight on file to calculate BMI.  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 Supportive therapy provided  about ongoing stressors. Discussed crisis plan, support from social network, calling 911, coming to the Emergency Department, and calling Suicide Hotline.  Waylan Boga, NP 12/28/2022 10:37 AM

## 2022-12-28 NOTE — ED Notes (Addendum)
Wrap removed from right hand, unable to locate wound, however patient refuses to allow RN assess palm/anterior fingers of left hand.

## 2022-12-28 NOTE — ED Notes (Signed)
IVC/pending psych inpatient admission when medically cleared

## 2022-12-28 NOTE — ED Notes (Signed)
Pt belongings include:  Chelsea Stanley sweater Pink/purple/blue pants Tan socks  Pt does still have her belly button ring in.

## 2022-12-28 NOTE — ED Notes (Signed)
Patient supine on stretcher with respirations even and non labored. Patient without signs of distress. Patient remains within view of sitter.

## 2022-12-29 ENCOUNTER — Inpatient Hospital Stay
Admission: AD | Admit: 2022-12-29 | Discharge: 2022-12-31 | DRG: 897 | Disposition: A | Discharge status: Home / Self Care | Payer: 59 | Source: Intra-hospital | Attending: Psychiatry | Admitting: Psychiatry

## 2022-12-29 ENCOUNTER — Other Ambulatory Visit: Payer: Self-pay

## 2022-12-29 ENCOUNTER — Encounter: Payer: Self-pay | Admitting: Psychiatric/Mental Health

## 2022-12-29 DIAGNOSIS — T4392XA Poisoning by unspecified psychotropic drug, intentional self-harm, initial encounter: Secondary | ICD-10-CM | POA: Diagnosis present

## 2022-12-29 DIAGNOSIS — F411 Generalized anxiety disorder: Secondary | ICD-10-CM | POA: Diagnosis present

## 2022-12-29 DIAGNOSIS — R569 Unspecified convulsions: Secondary | ICD-10-CM | POA: Diagnosis present

## 2022-12-29 DIAGNOSIS — F419 Anxiety disorder, unspecified: Secondary | ICD-10-CM | POA: Diagnosis present

## 2022-12-29 DIAGNOSIS — F1010 Alcohol abuse, uncomplicated: Secondary | ICD-10-CM | POA: Diagnosis present

## 2022-12-29 DIAGNOSIS — F19921 Other psychoactive substance use, unspecified with intoxication with delirium: Secondary | ICD-10-CM | POA: Diagnosis present

## 2022-12-29 DIAGNOSIS — K5900 Constipation, unspecified: Secondary | ICD-10-CM | POA: Diagnosis present

## 2022-12-29 DIAGNOSIS — F332 Major depressive disorder, recurrent severe without psychotic features: Secondary | ICD-10-CM | POA: Diagnosis present

## 2022-12-29 DIAGNOSIS — Z20822 Contact with and (suspected) exposure to covid-19: Secondary | ICD-10-CM | POA: Diagnosis present

## 2022-12-29 LAB — BASIC METABOLIC PANEL
Anion gap: 5 (ref 5–15)
BUN: 9 mg/dL (ref 6–20)
CO2: 21 mmol/L — ABNORMAL LOW (ref 22–32)
Calcium: 8.7 mg/dL — ABNORMAL LOW (ref 8.9–10.3)
Chloride: 112 mmol/L — ABNORMAL HIGH (ref 98–111)
Creatinine, Ser: 0.64 mg/dL (ref 0.44–1.00)
GFR, Estimated: >60 mL/min (ref >60)
Glucose, Bld: 103 mg/dL — ABNORMAL HIGH (ref 70–99)
Potassium: 3.6 mmol/L (ref 3.5–5.1)
Sodium: 138 mmol/L (ref 135–145)

## 2022-12-29 LAB — PHOSPHORUS: Phosphorus: 3.6 mg/dL (ref 2.5–4.6)

## 2022-12-29 LAB — MAGNESIUM: Magnesium: 2.2 mg/dL (ref 1.7–2.4)

## 2022-12-29 LAB — TOPIRAMATE LEVEL: Topiramate Lvl: 19.9 ug/mL (ref 2.0–25.0)

## 2022-12-29 MED ORDER — MAGNESIUM HYDROXIDE 400 MG/5ML PO SUSP: 30.0000 mL | Freq: Every day | ORAL | Status: DC | PRN

## 2022-12-29 MED ORDER — SERTRALINE HCL 100 MG PO TABS
100.0000 mg | ORAL_TABLET | Freq: Every day | ORAL | Status: DC
  Administered 2022-12-29 – 2022-12-31 (×3): 100 mg via ORAL
  Filled 2022-12-29 (×3): qty 1

## 2022-12-29 MED ORDER — ACETAMINOPHEN 325 MG PO TABS
650.0000 mg | ORAL_TABLET | Freq: Four times a day (QID) | ORAL | Status: DC | PRN
  Administered 2022-12-30: 650 mg via ORAL
  Filled 2022-12-29 (×2): qty 2

## 2022-12-29 MED ORDER — ALUM & MAG HYDROXIDE-SIMETH 200-200-20 MG/5ML PO SUSP: 30.0000 mL | ORAL | Status: DC | PRN

## 2022-12-29 MED ORDER — CLONAZEPAM 1 MG PO TABS
1.0000 mg | ORAL_TABLET | Freq: Two times a day (BID) | ORAL | Status: DC
  Administered 2022-12-29 – 2022-12-31 (×4): 1 mg via ORAL
  Filled 2022-12-29 (×4): qty 1

## 2022-12-29 MED ORDER — HYDROXYZINE HCL 25 MG PO TABS
25.0000 mg | ORAL_TABLET | Freq: Three times a day (TID) | ORAL | Status: DC | PRN
  Administered 2022-12-30 (×2): 25 mg via ORAL
  Filled 2022-12-29 (×2): qty 1

## 2022-12-29 MED ORDER — TRAZODONE HCL 50 MG PO TABS: 50.0000 mg | ORAL_TABLET | Freq: Every evening | ORAL | Status: DC | PRN

## 2022-12-29 NOTE — H&P (Signed)
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 index is 17.97 kg/m.  Treatment Plan Summary: Daily contact with patient to assess and evaluate symptoms and progress in treatment, Medication management, and Plan patient appears to have had an episode of delirium and agitation probably from the  combination of her multiple prescription medicines and some amount of alcohol.  No evidence that she had intentionally tried to hurt herself.  Patient is now lucid and denies any suicidal or homicidal ideation.  Clearly  has chronic problems with anxiety but at this point does not appear to be dangerous to herself or others.  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    Alethia Berthold, MD 2/12/20245:28 PM

## 2022-12-29 NOTE — BH Assessment (Signed)
Patient is to be admitted to [facility] BMU today 12/29/22 by Dr. Weber Cooks.  Attending Physician will be Dr.  Weber Cooks .   Patient has been assigned to room 306, by [place], Nicole Kindred.    ER staff is aware of the admission: Lattie Haw, ER Secretary   Dr. Jacelyn Grip, ER MD  Radonna Ricker, Patient's Nurse

## 2022-12-29 NOTE — ED Notes (Signed)
Ivc /recommend psych inpatient admission when medically cleared

## 2022-12-29 NOTE — BHH Group Notes (Signed)
[place] Group Notes:  (Nursing/MHT/Case Management/Adjunct)  Date:  12/29/2022  Time:  4:18 PM  Type of Therapy:  Psychoeducational Skills  Participation Level:  Did Not Attend  Participation Quality:    Affect:    Cognitive:    Insight:    Engagement in Group:    Modes of Intervention:    Summary of Progress/Problems: Did Not Attend  Hosie Spangle 12/29/2022, 4:18 PM

## 2022-12-29 NOTE — ED Notes (Addendum)
Report to [place], RN [facility] BMU   Pt able to independently get up from stretcher and into wheelchair in ED hallway, but is slow to ambulate upon awakening-- per her report due to her upper and neck chronic injuries and pain. Pt reports that she is intermittently incontinent at night but makes "conscious effort" to get herself to the bathroom during the day. States this intermittent issue is due to her previous neck/back injury and chronic. Pt clean and dry on transfer from ED to [facility] BMU. Pt able to change herself into new pants, underwear, and pad prior to transfer. Pt does not use cane or walker at home per her report. Taken to [facility] by ED Tech, [place] and security. All of belongings transferred with her as well as appropriate paperwork including IVC.

## 2022-12-29 NOTE — ED Notes (Signed)
Pts single belongings bag transported to designated locked patient belongings room by [place], EDT.

## 2022-12-29 NOTE — Group Note (Signed)
[facility] LCSW Group Therapy Note    Group Date: 12/29/2022 Start Time: 1300 End Time: 1400  Type of Therapy and Topic:  Group Therapy:  Overcoming Obstacles  Participation Level:  BHH PARTICIPATION LEVEL: Active   Description of Group:   In this group patients will be encouraged to explore what they see as obstacles to their own wellness and recovery. They will be guided to discuss their thoughts, feelings, and behaviors related to these obstacles. The group will process together ways to cope with barriers, with attention given to specific choices patients can make. Each patient will be challenged to identify changes they are motivated to make in order to overcome their obstacles. This group will be process-oriented, with patients participating in exploration of their own experiences as well as giving and receiving support and challenge from other group members.  Therapeutic Goals: 1. Patient will identify personal and current obstacles as they relate to admission. 2. Patient will identify barriers that currently interfere with their wellness or overcoming obstacles.  3. Patient will identify feelings, thought process and behaviors related to these barriers. 4. Patient will identify two changes they are willing to make to overcome these obstacles:    Summary of Patient Progress Patient was present for the entirety of the group process. She was actively involved in the discussion. Stigma was identified as an obstacle that she faces due to intersections of homelessness, mental illness, and lack of finances. Pt spoke about her view of the world and how people within society look at her differently because of her status. She shared that she lost her job after having an stroke at work. Pt appeared open and receptive to feedback/comments from both peers and facilitator.    Therapeutic Modalities:   Cognitive Behavioral Therapy Solution Focused  Therapy Motivational Interviewing Relapse Prevention Therapy   Shirl Harris, LCSW

## 2022-12-29 NOTE — ED Notes (Signed)
Pt clean and dry.

## 2022-12-29 NOTE — Progress Notes (Signed)
Patient ID: [name], female   DOB: 09-27-1966, 57 y.o.   MRN: [id number] Admission note: Patient is a 57 year old female, presents IVC per report of major depressive disorder. Patient is alert and oriented to unit. Patient states that she does not remember why she was brought to the hospital. Patient states " I just remember getting really irritated with spouse." When alcohol use was assessed  patient states "alcohol was involved then everything he said started irritating me."  Patient is cooperative during assessment questions. 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 Patient endorses 8/10 pain in arm.  Patient denies stressors related to admission at this time. Patient identified "self awareness and other peoples awareness of self"   Patient currently denies SI/HI/AVH. Unit policies explained and verbalized understanding. Q15 minute checks maintained and will continue to monitor.

## 2022-12-29 NOTE — BHH Suicide Risk Assessment (Signed)
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  57 year old woman came to the emergency room under IVC because of agitation and aggression directed towards her boyfriend and then towards first responders.  On interview today the patient has only vague memories of the events that brought her to the hospital.  She denies any current suicidal ideation or wish to harm anyone else.  Does not appear to be psychotic.  Complains of chronic difficulty with memory.  Patient insists that she did not intentionally take any extra medication prior to coming in.  Continued Clinical Symptoms:  Alcohol Use Disorder Identification Test Final Score (AUDIT): 0 The "Alcohol Use Disorders Identification Test", Guidelines for Use in Primary Care, Second Edition.  World Pharmacologist [facility]). Score between 0-7:  no or low risk or alcohol related problems. Score between 8-15:  moderate risk of alcohol related problems. Score between 16-19:  high risk of alcohol related problems. Score 20 or above:  warrants further diagnostic evaluation for alcohol dependence and treatment.   CLINICAL FACTORS:   Dysthymia Medical Diagnoses and Treatments/Surgeries   Musculoskeletal: Strength & Muscle Tone: decreased Gait & Station: unable to stand Patient leans: N/A  Psychiatric Specialty Exam:  Presentation  General Appearance:  Appropriate for Environment  Eye Contact: Absent  Speech: Blocked  Speech Volume: Decreased  Handedness: Right   Mood and Affect   Mood: Dysphoric  Affect: Congruent   Thought Process  Thought Processes: Disorganized  Descriptions of Associations:Circumstantial  Orientation:Partial  Thought Content:-- (unable to assess)  History of Schizophrenia/Schizoaffective disorder:-- (unable to assess)  Duration of Psychotic Symptoms:No data recorded Hallucinations:No data recorded Ideas of Reference:No data recorded Suicidal Thoughts:Suicidal Thoughts: Yes, Active  Homicidal Thoughts:No data recorded  Sensorium  Memory:No data recorded Judgment:No data recorded Insight:No data recorded  Executive Functions  Concentration:No data recorded Attention Span:No data recorded Recall:No data recorded Fund of Knowledge:No data recorded Language:No data recorded  Psychomotor Activity  Psychomotor Activity:No data recorded  Assets  Assets:No data recorded  Sleep  Sleep: Sleep: Poor    Physical Exam: Physical Exam Vitals and nursing note reviewed.  Constitutional:      Appearance: She is ill-appearing.  HENT:     Head: Normocephalic and atraumatic.     Mouth/Throat:     Pharynx: Oropharynx is clear.  Eyes:     Pupils: Pupils are equal, round, and reactive to light.  Cardiovascular:     Rate and Rhythm: Normal rate and regular rhythm.  Pulmonary:     Effort: Pulmonary effort is normal.     Breath sounds: Normal breath sounds.  Abdominal:     General: Abdomen is flat.     Palpations: Abdomen is soft.  Musculoskeletal:        General: Normal range of motion.     Comments: Patient is unable to use her right arm due to a history of extensive surgery.  Also uses a wheelchair.  Skin:    General: Skin is warm and dry.  Neurological:     General: No focal deficit present.     Mental Status: She is alert. Mental status is at baseline.  Psychiatric:  Attention and Perception: Attention normal.        Mood and Affect: Affect is tearful.        Speech: Speech normal.        Behavior:  Behavior is slowed.        Thought Content: Thought content normal. Thought content does not include homicidal or suicidal ideation.        Cognition and Memory: Cognition is impaired. Memory is impaired.    Review of Systems  Constitutional:  Positive for malaise/fatigue.  HENT: Negative.    Eyes: Negative.   Respiratory: Negative.    Cardiovascular: Negative.   Gastrointestinal: Negative.   Musculoskeletal: Negative.   Skin: Negative.   Neurological:  Positive for focal weakness.  Psychiatric/Behavioral:  Positive for depression and memory loss. Negative for hallucinations and suicidal ideas. The patient is nervous/anxious.    Blood pressure (!) 146/95, pulse 63, temperature 97.8 F (36.6 C), temperature source Oral, resp. rate 16, height 5' 5"$  (1.651 m), weight 49 kg, SpO2 100 %. Body mass index is 17.97 kg/m.   COGNITIVE FEATURES THAT CONTRIBUTE TO RISK:  None    SUICIDE RISK:   Minimal: No identifiable suicidal ideation.  Patients presenting with no risk factors but with morbid ruminations; may be classified as minimal risk based on the severity of the depressive symptoms  PLAN OF CARE: Continue 15-minute checks.  Labs reviewed.  Patient at this time does not appear to be an acute danger to herself or others nor to require further inpatient treatment.  Recommend discharge tomorrow.  I certify that inpatient services furnished can reasonably be expected to improve the patient's condition.   Alethia Berthold, MD 12/29/2022, 5:25 PM

## 2022-12-29 NOTE — Tx Team (Signed)
Initial Treatment Plan 12/29/2022 2:48 PM Chelsea Stanley [id number]    PATIENT STRESSORS: Marital or family conflict     PATIENT STRENGTHS: Motivation for treatment/growth    PATIENT IDENTIFIED PROBLEMS: Anxiety, depression                     DISCHARGE CRITERIA:  Improved stabilization in mood, thinking, and/or behavior  PRELIMINARY DISCHARGE PLAN: Return to previous living arrangement  PATIENT/FAMILY INVOLVEMENT: This treatment plan has been presented to and reviewed with the patient, [name], and/or family member. The patient and family have been given the opportunity to ask questions and make suggestions.  Donette Larry, RN 12/29/2022, 2:48 PM

## 2022-12-29 NOTE — Consult Note (Signed)
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 Suspected of ingesting unknown amount of Seroquel, Zoloft, tramadol, clonazepam, topiramate and ETOH    Goal of Therapy:  WNL  Plan:  No replacement needed at this time.  F/u with AM labs.   Alison Murray ,PharmD Clinical Pharmacist 12/29/2022 9:18 AM

## 2022-12-30 DIAGNOSIS — F19921 Other psychoactive substance use, unspecified with intoxication with delirium: Secondary | ICD-10-CM | POA: Diagnosis not present

## 2022-12-30 MED ORDER — TRAZODONE HCL 100 MG PO TABS
100.0000 mg | ORAL_TABLET | Freq: Every day | ORAL | Status: DC
  Administered 2022-12-30: 100 mg via ORAL
  Filled 2022-12-30 (×2): qty 1

## 2022-12-30 MED ORDER — OLANZAPINE 10 MG PO TABS
10.0000 mg | ORAL_TABLET | Freq: Four times a day (QID) | ORAL | Status: DC | PRN
  Administered 2022-12-30 (×2): 10 mg via ORAL
  Filled 2022-12-30 (×2): qty 1

## 2022-12-30 MED ORDER — OLANZAPINE 10 MG IM SOLR: 10.0000 mg | Freq: Four times a day (QID) | INTRAMUSCULAR | Status: DC | PRN

## 2022-12-30 NOTE — Plan of Care (Addendum)
D- Patient alert and oriented. Patient presented in an anxious, preoccupied mood on assessment reporting that she slept "good and then woke up, I couldn't go to sleep, and then there was that toilet issue. I know you heard about that". Patient denied depression, but endorsed anxiety, stating that she has some good anxiety about going home. Patient also denied SI, HI, AVH, and pain at this time. Patient's goal for today, per her self-inventory is "going home, seeing my family".  A- Scheduled medications administered to patient, per MD orders. Support and encouragement provided.  Routine safety checks conducted every 15 minutes.  Patient informed to notify staff with problems or concerns.  R- No adverse drug reactions noted. Patient contracts for safety at this time. Patient compliant with medications. Patient receptive, calm, and cooperative. Patient isolated to room, except for breakfast and morning medication. Patient remains safe at this time.  Problem: Education: Goal: Knowledge of General Education information will improve Description: Including pain rating scale, medication(s)/side effects and non-pharmacologic comfort measures Outcome: Not Progressing   Problem: Health Behavior/Discharge Planning: Goal: Ability to manage health-related needs will improve Outcome: Not Progressing   Problem: Clinical Measurements: Goal: Ability to maintain clinical measurements within normal limits will improve Outcome: Not Progressing Goal: Will remain free from infection Outcome: Not Progressing Goal: Diagnostic test results will improve Outcome: Not Progressing Goal: Respiratory complications will improve Outcome: Not Progressing Goal: Cardiovascular complication will be avoided Outcome: Not Progressing   Problem: Activity: Goal: Risk for activity intolerance will decrease Outcome: Not Progressing   Problem: Nutrition: Goal: Adequate nutrition will be maintained Outcome: Not Progressing    Problem: Coping: Goal: Level of anxiety will decrease Outcome: Not Progressing   Problem: Elimination: Goal: Will not experience complications related to bowel motility Outcome: Not Progressing Goal: Will not experience complications related to urinary retention Outcome: Not Progressing   Problem: Pain Managment: Goal: General experience of comfort will improve Outcome: Not Progressing   Problem: Safety: Goal: Ability to remain free from injury will improve Outcome: Not Progressing   Problem: Skin Integrity: Goal: Risk for impaired skin integrity will decrease Outcome: Not Progressing   Problem: Education: Goal: Knowledge of [place] General Education information/materials will improve Outcome: Not Progressing Goal: Emotional status will improve Outcome: Not Progressing Goal: Mental status will improve Outcome: Not Progressing Goal: Verbalization of understanding the information provided will improve Outcome: Not Progressing   Problem: Activity: Goal: Interest or engagement in activities will improve Outcome: Not Progressing Goal: Sleeping patterns will improve Outcome: Not Progressing   Problem: Coping: Goal: Ability to verbalize frustrations and anger appropriately will improve Outcome: Not Progressing Goal: Ability to demonstrate self-control will improve Outcome: Not Progressing   Problem: Health Behavior/Discharge Planning: Goal: Identification of resources available to assist in meeting health care needs will improve Outcome: Not Progressing Goal: Compliance with treatment plan for underlying cause of condition will improve Outcome: Not Progressing   Problem: Physical Regulation: Goal: Ability to maintain clinical measurements within normal limits will improve Outcome: Not Progressing   Problem: Safety: Goal: Periods of time without injury will increase Outcome: Not Progressing

## 2022-12-30 NOTE — Progress Notes (Signed)
MD was informed about patient's behavior and new PRN orders were placed for agitation.

## 2022-12-30 NOTE — Progress Notes (Signed)
Patient has been heard screaming in her room. When staff went to see what the patient is upset about, she was heard screaming "I can't stay here another night".

## 2022-12-30 NOTE — BHH Counselor (Signed)
Pt had a rough morning. She was heard screaming and yelling in her room, that she could not stay here for another night. Pt was seen self soothing at some points, however, was ultimately given medication per agitation protocol. When CSW attempted to meet with pt, she was sleeping soundly and appeared to be in no acute distress. Further attempts will be made at a later time.   Chalmers Guest. Guerry Bruin, MSW, [place] Pena, [place] 12/30/2022 3:26 PM

## 2022-12-30 NOTE — Group Note (Addendum)
Recreation Therapy Group Note   Group Topic:Problem Solving  Group Date: 12/30/2022 Start Time: 1000 End Time: 1055 Facilitators: Vilma Prader, LRT, CTRS Location:  Craft Room  Group Description: Life Boat. Patients were given the scenario that they are on a boat that is about to become shipwrecked, leaving them stranded on an [place]. They are asked to make a list of 15 different items that they want to take with them when they are stranded on the [place]. Patients are asked to rank their items from most important to least important, #1 being the most important and #15 being the least. Patients will work individually for the first round to come up with 15 items and then pair up with a peer(s) to condense their list and come up with one list of 15 items between the two of them. Patients or LRT will read aloud the 15 different items to the group after each round.   Affect/Mood: Congruent and Full range   Participation Level: Active   Participation Quality: Independent   Behavior: Alert, Cooperative, and Interactive    Speech/Thought Process: Coherent and Directed   Insight: Fair   Judgement: Limited   Modes of Intervention: Activity and Group work   Patient Response to Interventions:  Attentive and Receptive   Education Outcome:  In group clarification offered    Clinical Observations/Individualized Feedback: Chelsea Stanley was mostly active in their participation of session activities and group discussion. Pt identified "a lighter" as her number one priority item that she will bring with her on the [place]. Pt seemed to be somewhat hyper-verbal at times and would have a response to everything that peers said. Comments were appropriate and on task. Pt seen asking less interactive peers their opinion on things and tried to include them in group conversation.  Plan: Continue to engage patient in RT group sessions 2-3x/week.   Vilma Prader, LRT, CTRS 12/30/2022 11:31 AM

## 2022-12-30 NOTE — BHH Group Notes (Signed)
[place] Group Notes:  (Nursing/MHT/Case Management/Adjunct)  Date:  12/30/2022  Time:  4:37 AM  Type of Therapy:  Group Therapy  Participation Level:  Did Not Attend    Maglione,Alijah Akram E 12/30/2022, 4:37 AM

## 2022-12-30 NOTE — BHH Group Notes (Signed)
[place] Group Notes:  (Nursing/MHT/Case Management/Adjunct)  Date:  12/30/2022  Time:  8:03 PM  Type of Therapy:   Wrap up  Participation Level:  Did Not Attend  Summary of Progress/Problems:  Chelsea Stanley 12/30/2022, 8:03 PM
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
## 2022-12-30 NOTE — Plan of Care (Signed)
Pt rested through night with the exception of needing to move rooms due to a plumbing issue in her room.  Pt demeanor is calm and pleasant.  Denies SI, HI AVH.  Continue   to monitor via routine checks.    Problem: Coping: Goal: Level of anxiety will decrease Outcome: Progressing  Problem: Activity: Goal: Sleeping patterns will improve Outcome: Progressing   Problem: Health Behavior/Discharge Planning: Goal: Identification of resources available to assist in meeting health care needs will improve Outcome: Progressing

## 2022-12-30 NOTE — Group Note (Signed)
LCSW Group Therapy Note   Group Date: 12/30/2022 Start Time: 1310 End Time: 1400   Type of Therapy and Topic:  Group Therapy: Boundaries  Participation Level:  Did Not Attend  Description of Group: This group will address the use of boundaries in their personal lives. Patients will explore why boundaries are important, the difference between healthy and unhealthy boundaries, and negative and postive outcomes of different boundaries and will look at how boundaries can be crossed.  Patients will be encouraged to identify current boundaries in their own lives and identify what kind of boundary is being set. Facilitators will guide patients in utilizing problem-solving interventions to address and correct types boundaries being used and to address when no boundary is being used. Understanding and applying boundaries will be explored and addressed for obtaining and maintaining a balanced life. Patients will be encouraged to explore ways to assertively make their boundaries and needs known to significant others in their lives, using other group members and facilitator for role play, support, and feedback.  Therapeutic Goals:  1.  Patient will identify areas in their life where setting clear boundaries could be  used to improve their life.  2.  Patient will identify signs/triggers that a boundary is not being respected. 3.  Patient will identify two ways to set boundaries in order to achieve balance in  their lives: 4.  Patient will demonstrate ability to communicate their needs and set boundaries  through discussion and/or role plays  Summary of Patient Progress:   X  Therapeutic Modalities:   Cognitive Behavioral Therapy Solution-Focused Therapy  Rozann Lesches, [place] 12/30/2022  2:07 PM

## 2022-12-30 NOTE — Progress Notes (Signed)
Patient did not eat lunch nor dinner today.

## 2022-12-30 NOTE — [facility] Group Notes (Signed)
[facility] Group Notes:  (Nursing/MHT/Case Management/Adjunct)  Date:  12/30/2022  Time:  6:02 PM  Type of Therapy:  Psychoeducational Skills  Participation Level:  Did Not Attend   Adela Lank [facility] 12/30/2022, 6:02 PM

## 2022-12-31 DIAGNOSIS — F19921 Other psychoactive substance use, unspecified with intoxication with delirium: Secondary | ICD-10-CM | POA: Diagnosis not present

## 2022-12-31 MED ORDER — HYDROXYZINE HCL 25 MG PO TABS: 25.0000 mg | ORAL_TABLET | Freq: Three times a day (TID) | ORAL | 1 refills | Status: DC | PRN

## 2022-12-31 MED ORDER — TRAZODONE HCL 100 MG PO TABS: 100.0000 mg | ORAL_TABLET | Freq: Every day | ORAL | 1 refills | Status: DC

## 2022-12-31 MED ORDER — SERTRALINE HCL 100 MG PO TABS: 100.0000 mg | ORAL_TABLET | Freq: Every day | ORAL | 1 refills | Status: DC

## 2022-12-31 NOTE — Progress Notes (Signed)
Discharge Note:  Patient denies SI/HI/AVH at this time. Discharge instructions, AVS, prescriptions, and transition record gone over with patient. Patient agrees to comply with medication management, follow-up visit, and outpatient therapy. Patient belongings returned to patient. No questions or concerns at present. Patient wheeled in wheelchair to off unit to the parking lot where patient was discharged to home with her husband.

## 2022-12-31 NOTE — Progress Notes (Signed)
   12/30/22 2200  Psych Admission Type (Psych Patients Only)  Admission Status Involuntary  Psychosocial Assessment  Patient Complaints Anxiety;Depression  Eye Contact Fair  Facial Expression Animated;Anxious  Affect Anxious  Speech Logical/coherent  Interaction Assertive  Motor Activity Slow  Appearance/Hygiene In scrubs  Behavior Characteristics Cooperative;Appropriate to situation  Mood Anxious  Aggressive Behavior  Effect No apparent injury  Thought Process  Coherency Circumstantial  Content WDL  Delusions None reported or observed  Perception WDL  Hallucination None reported or observed  Judgment Limited  Danger to Self  Current suicidal ideation? Denies (Denies)  Danger to Others  Danger to Others None reported or observed   Patient is compliant with medications denies SI/HI/A/VH and verbally contracted for safety. No adverse drug noted. Q 15 safety checks ongoing without self harm gestures. Patient ambulating with w/c in her room and hall way.

## 2022-12-31 NOTE — Plan of Care (Signed)
  Problem: Education: Goal: Knowledge of General Education information will improve Description: Including pain rating scale, medication(s)/side effects and non-pharmacologic comfort measures Outcome: Adequate for Discharge   Problem: Health Behavior/Discharge Planning: Goal: Ability to manage health-related needs will improve Outcome: Adequate for Discharge   Problem: Clinical Measurements: Goal: Ability to maintain clinical measurements within normal limits will improve Outcome: Adequate for Discharge Goal: Will remain free from infection Outcome: Adequate for Discharge Goal: Diagnostic test results will improve Outcome: Adequate for Discharge Goal: Respiratory complications will improve Outcome: Adequate for Discharge Goal: Cardiovascular complication will be avoided Outcome: Adequate for Discharge   Problem: Activity: Goal: Risk for activity intolerance will decrease Outcome: Adequate for Discharge   Problem: Nutrition: Goal: Adequate nutrition will be maintained Outcome: Adequate for Discharge   Problem: Coping: Goal: Level of anxiety will decrease Outcome: Adequate for Discharge   Problem: Elimination: Goal: Will not experience complications related to bowel motility Outcome: Adequate for Discharge Goal: Will not experience complications related to urinary retention Outcome: Adequate for Discharge   Problem: Pain Managment: Goal: General experience of comfort will improve Outcome: Adequate for Discharge   Problem: Safety: Goal: Ability to remain free from injury will improve Outcome: Adequate for Discharge   Problem: Skin Integrity: Goal: Risk for impaired skin integrity will decrease Outcome: Adequate for Discharge   Problem: Education: Goal: Knowledge of [place] [facility] Education information/materials will improve Outcome: Adequate for Discharge Goal: Emotional status will improve Outcome: Adequate for Discharge Goal: Mental status will  improve Outcome: Adequate for Discharge Goal: Verbalization of understanding the information provided will improve Outcome: Adequate for Discharge   Problem: Activity: Goal: Interest or engagement in activities will improve Outcome: Adequate for Discharge Goal: Sleeping patterns will improve Outcome: Adequate for Discharge   Problem: Coping: Goal: Ability to verbalize frustrations and anger appropriately will improve Outcome: Adequate for Discharge Goal: Ability to demonstrate self-control will improve Outcome: Adequate for Discharge   Problem: Health Behavior/Discharge Planning: Goal: Identification of resources available to assist in meeting health care needs will improve Outcome: Adequate for Discharge Goal: Compliance with treatment plan for underlying cause of condition will improve Outcome: Adequate for Discharge   Problem: Physical Regulation: Goal: Ability to maintain clinical measurements within normal limits will improve Outcome: Adequate for Discharge   Problem: Safety: Goal: Periods of time without injury will increase Outcome: Adequate for Discharge   

## 2022-12-31 NOTE — Group Note (Signed)
Recreation Therapy Group Note   Group Topic:Self-Esteem  Group Date: 12/31/2022 Start Time: 1000 End Time: 1035 Facilitators: Vilma Prader, LRT, CTRS Location:  Dayroom  Group Description: Patients and LRT discussed the importance of self-love and self-esteem. Pt completed a worksheet that helps them identify 24 different strengths and qualities about themselves. Pt encouraged to read aloud at least 3 off their sheet to the group. Pt's then had the choice to play "Positive Affirmation Bingo" afterwards, with journals or stress balls as bingo prizes.   Affect/Mood: Appropriate and Happy   Participation Level: Active and Engaged   Participation Quality: Independent   Behavior: Alert, Appropriate, and Calm   Speech/Thought Process: Directed and Organized   Insight: Good   Judgement: Good   Modes of Intervention: Activity and Worksheet   Patient Response to Interventions:  Attentive, Engaged, and Receptive   Education Outcome:  Acknowledges education   Clinical Observations/Individualized Feedback: [name] was active in their participation of session activities and group discussion. Pt identified "my hair and my creativity" as things that make her unique. Pt was bright and cheerful throughout group and shared that she was excited to go home today.    Plan: Continue to engage patient in RT group sessions 2-3x/week.   Vilma Prader, LRT, CTRS 12/31/2022 10:57 AM

## 2022-12-31 NOTE — Group Note (Signed)
LCSW Group Therapy Note  Group Date: 12/31/2022 Start Time: 1300 End Time: 1400   Type of Therapy and Topic:  Group Therapy - Healthy vs Unhealthy Coping Skills  Participation Level:  Active   Description of Group The focus of this group was to determine what unhealthy coping techniques typically are used by group members and what healthy coping techniques would be helpful in coping with various problems. Patients were guided in becoming aware of the differences between healthy and unhealthy coping techniques. Patients were asked to identify 2-3 healthy coping skills they would like to learn to use more effectively.  Therapeutic Goals Patients learned that coping is what human beings do all day long to deal with various situations in their lives Patients defined and discussed healthy vs unhealthy coping techniques Patients identified their preferred coping techniques and identified whether these were healthy or unhealthy Patients determined 2-3 healthy coping skills they would like to become more familiar with and use more often. Patients provided support and ideas to each other   Summary of Patient Progress:   Patient was present for the entirety of the group session. Patient was an active listener and participated in the topic of discussion, provided helpful advice to others, and added nuance to topic of conversation. Patient was at times circumstantial, however, would come back to topic of discussion eventually. Presented as appropriate for group setting.    Therapeutic Modalities Cognitive Behavioral Therapy Motivational Interviewing  Larose Kells 12/31/2022  2:32 PM

## 2022-12-31 NOTE — [facility] Suicide Risk Assessment (Signed)
[place] INPATIENT:  Family/Significant Other Suicide Prevention Education  Suicide Prevention Education:  Patient Refusal for Family/Significant Other Suicide Prevention Education: The patient Chelsea Stanley has refused to provide written consent for family/significant other to be provided Family/Significant Other Suicide Prevention Education during admission and/or prior to discharge.  Physician notified.  SPE completed with pt, as pt refused to consent to family contact. SPI pamphlet provided to pt and pt was encouraged to share information with support network, ask questions, and talk about any concerns relating to SPE. Pt denies access to guns/firearms and verbalized understanding of information provided. Mobile Crisis information also provided to pt.  Shirl Harris 12/31/2022, 1:34 PM

## 2022-12-31 NOTE — Care Management Important Message (Signed)
Important Message  Patient Details  Name: Chelsea Stanley MRN: [id number] Date of Birth: 1966-08-19   Medicare Important Message Given:  Yes  Patient informed of right to appeal discharge, provided phone number to [facility]. Patient expressed no interest in appealing discharge at this time. CSW will continue to monitor situation.   Durenda Hurt, LCSWA 12/31/2022, 11:30 AM

## 2022-12-31 NOTE — Discharge Summary (Signed)
Physician Discharge Summary Note  Patient:  Chelsea Stanley is an 57 y.o., female MRN:  [id number] DOB:  1966-11-15 Patient phone:  [phone] (home)  Patient address:   [place] [address],  Total Time spent with patient: 30 minutes  Date of Admission:  12/29/2022 Date of Discharge: 12/31/2022  Reason for Admission: Patient was admitted after presenting to the emergency room with agitation and a change in her mental state that appeared to be at least in part related to her medications and substances.  History of recent aggression and threats towards her long-term partner.  Principal Problem: Substance-induced delirium [facility]) Discharge Diagnoses: Principal Problem:   Substance-induced delirium ([place])   Past Psychiatric History: History of past treatment for anxiety and depression symptoms.  Currently not seeing a psychiatrist but being prescribed clonazepam and mood medicines by another provider.  Past Medical History:  Past Medical History:  Diagnosis Date   Depression     Past Surgical History:  Procedure Laterality Date   BREAST SURGERY     INCISION AND DRAINAGE ABSCESS Right 11/26/2021   Procedure: INCISION AND DEBRIDEMENT RIGHT THUMB, WRIST, AND FOREARM;  Surgeon: Thornton Park, MD;  Location: [facility] ORS;  Service: Orthopedics;  Laterality: Right;   Family History: History reviewed. No pertinent family history. Family Psychiatric  History: See previous Social History:  Social History   Substance and Sexual Activity  Alcohol Use Yes     Social History   Substance and Sexual Activity  Drug Use Not on file    Social History   Socioeconomic History   Marital status: Single    Spouse name: Not on file   Number of children: Not on file   Years of education: Not on file   Highest education level: Not on file  Occupational History   Not on file  Tobacco Use   Smoking status: Never   Smokeless tobacco: Never  Substance and Sexual Activity    Alcohol use: Yes   Drug use: Not on file   Sexual activity: Not on file  Other Topics Concern   Not on file  Social History Narrative   Not on file   Social Determinants of Health   Financial Resource Strain: Not on file  Food Insecurity: No Food Insecurity (12/29/2022)   Hunger Vital Sign    Worried About Running Out of Food in the Last Year: Never true    Ran Out of Food in the Last Year: Never true  Transportation Needs: No Transportation Needs (12/29/2022)   PRAPARE - Hydrologist (Medical): No    Lack of Transportation (Non-Medical): No  Physical Activity: Not on file  Stress: Not on file  Social Connections: Not on file    Hospital Course: Admitted to psychiatric unit.  15-minute checks continued.  Patient was provided with a wheelchair for safety of ambulation.  On admission she was calm and lucid and denying suicidal or homicidal thoughts.  I continued her clonazepam for fear of causing withdrawal symptoms but limited other medicines.  Yesterday the patient became agitated when she was told she would not be discharged.  She was not violent others but was verbally disruptive.  This morning patient apologizes and is calm and lucid.  No evidence of acute dangerousness.  Has been counseled about the risks of combining alcohol with her medication and encouraged to talk with her providers about all of her medicines and to see an outpatient mental health provider.  At this point  not acutely dangerous can be discharged home.  Physical Findings: AIMS:  , ,  ,  ,    CIWA:    COWS:     Musculoskeletal: Strength & Muscle Tone: decreased Gait & Station: unsteady Patient leans: N/A   Psychiatric Specialty Exam:  Presentation  General Appearance:  Appropriate for Environment  Eye Contact: Absent  Speech: Blocked  Speech Volume: Decreased  Handedness: Right   Mood and Affect  Mood: Dysphoric  Affect: Congruent   Thought Process   Thought Processes: Disorganized  Descriptions of Associations:Circumstantial  Orientation:Partial  Thought Content:-- (unable to assess)  History of Schizophrenia/Schizoaffective disorder:-- (unable to assess)  Duration of Psychotic Symptoms:No data recorded Hallucinations:No data recorded Ideas of Reference:No data recorded Suicidal Thoughts:No data recorded Homicidal Thoughts:No data recorded  Sensorium  Memory:No data recorded Judgment:No data recorded Insight:No data recorded  Executive Functions  Concentration:No data recorded Attention Span:No data recorded Recall:No data recorded [place] recorded Language:No data recorded  Psychomotor Activity  Psychomotor Activity:No data recorded  Assets  Assets:No data recorded  Sleep  Sleep:No data recorded   Physical Exam: Physical Exam Vitals and nursing note reviewed.  Constitutional:      Appearance: Normal appearance.  HENT:     Head: Normocephalic and atraumatic.     Mouth/Throat:     Pharynx: Oropharynx is clear.  Eyes:     Pupils: Pupils are equal, round, and reactive to light.  Cardiovascular:     Rate and Rhythm: Normal rate and regular rhythm.  Pulmonary:     Effort: Pulmonary effort is normal.     Breath sounds: Normal breath sounds.  Abdominal:     General: Abdomen is flat.     Palpations: Abdomen is soft.  Musculoskeletal:        General: Normal range of motion.     Comments: Chronic atrophy and weakness in the right arm.  Impaired ability to walk chronic pain.  Skin:    General: Skin is warm and dry.  Neurological:     General: No focal deficit present.     Mental Status: She is alert. Mental status is at baseline.  Psychiatric:        Attention and Perception: Attention normal.        Mood and Affect: Mood normal.        Speech: Speech normal.        Behavior: Behavior normal.        Thought Content: Thought content normal.        Cognition and Memory: Cognition  normal.        Judgment: Judgment normal.    Review of Systems  Constitutional: Negative.   HENT: Negative.    Eyes: Negative.   Respiratory: Negative.    Cardiovascular: Negative.   Gastrointestinal: Negative.   Musculoskeletal:  Positive for joint pain.  Skin: Negative.   Neurological:  Positive for focal weakness and weakness.  Psychiatric/Behavioral: Negative.     Blood pressure (!) 115/94, pulse 91, temperature 98.5 F (36.9 C), temperature source Oral, resp. rate 18, height 5' 5"$  (1.651 m), weight 49 kg, SpO2 98 %. Body mass index is 17.97 kg/m.   Social History   Tobacco Use  Smoking Status Never  Smokeless Tobacco Never   Tobacco Cessation:  N/A, patient does not currently use tobacco products   Blood Alcohol level:  Lab Results  Component Value Date   ETH 40 (H) 12/27/2022   ETH <10 123XX123    Metabolic Disorder  Labs:  No results found for: "HGBA1C", "MPG" No results found for: "PROLACTIN" No results found for: "CHOL", "TRIG", "HDL", "CHOLHDL", "VLDL", "[place]"  See Psychiatric Specialty Exam and Suicide Risk Assessment completed by Attending Physician prior to discharge.  Discharge destination:  Home  Is patient on multiple antipsychotic therapies at discharge:  No   Has Patient had three or more failed trials of antipsychotic monotherapy by history:  No  Recommended Plan for Multiple Antipsychotic Therapies: NA  Discharge Instructions     Diet - low sodium heart healthy   Complete by: As directed    Increase activity slowly   Complete by: As directed       Allergies as of 12/31/2022       Reactions   Clindamycin/lincomycin Anaphylaxis   Peach [prunus Persica] Anaphylaxis   Any fuzzy fruit   Penicillins Anaphylaxis   Has patient had a PCN reaction causing immediate rash, facial/tongue/throat swelling, SOB or lightheadedness with hypotension: Yes Has patient had a PCN reaction causing severe rash involving mucus membranes or skin  necrosis: Yes Has patient had a PCN reaction that required hospitalization: Yes Has patient had a PCN reaction occurring within the last 10 years: No If all of the above answers are "NO", then may proceed with Cephalosporin use.   Strawberry (diagnostic) Anaphylaxis   Honey Bee Venom [bee Venom]    Peanut-containing Drug Products Swelling   Benadryl [diphenhydramine] Hives, Anxiety, Palpitations   Latex Rash   Levofloxacin Rash   Sulfa Antibiotics Rash   Vancomycin Rash   Voltaren [diclofenac] Rash        Medication List     STOP taking these medications    acetaminophen 500 MG tablet Commonly known as: TYLENOL   Butalbital-APAP-Caffeine 50-325-40 MG capsule   cefpodoxime 200 MG tablet Commonly known as: VANTIN   mirtazapine 15 MG tablet Commonly known as: REMERON   ondansetron 4 MG tablet Commonly known as: ZOFRAN   oxymetazoline 0.05 % nasal spray Commonly known as: AFRIN   promethazine 12.5 MG tablet Commonly known as: PHENERGAN   QUEtiapine 50 MG tablet Commonly known as: SEROQUEL       TAKE these medications      Indication  clonazePAM 1 MG tablet Commonly known as: KLONOPIN Take 1 tablet by mouth 2 (two) times daily.  Indication: Feeling Anxious   hydrOXYzine 25 MG tablet Commonly known as: ATARAX Take 1 tablet (25 mg total) by mouth 3 (three) times daily as needed for anxiety.  Indication: Feeling Anxious   melatonin 5 MG Tabs Take 5 mg by mouth at bedtime as needed.  Indication: Trouble Sleeping   polyethylene glycol 17 g packet Commonly known as: MIRALAX / GLYCOLAX Take 17 g by mouth every other day.  Indication: Constipation   sertraline 100 MG tablet Commonly known as: ZOLOFT Take 1 tablet (100 mg total) by mouth daily. Start taking on: January 01, 2023 What changed:  how much to take when to take this  Indication: Generalized Anxiety Disorder, Major Depressive Disorder   topiramate 25 MG tablet Commonly known as:  TOPAMAX Take 75 mg by mouth daily.  Indication: Partial Onset Seizure   traZODone 100 MG tablet Commonly known as: DESYREL Take 1 tablet (100 mg total) by mouth at bedtime. What changed: when to take this  Indication: Trouble Sleeping         Follow-up recommendations:  Other:  Limited prescriptions.  Patient has her own medicines at home and no significant changes have been made.  Encourage follow-up mental health care.  Comments: See above  Signed: Alethia Berthold, MD 12/31/2022, 10:48 AM

## 2022-12-31 NOTE — Plan of Care (Signed)
  Problem: Education: Goal: Knowledge of General Education information will improve Description: Including pain rating scale, medication(s)/side effects and non-pharmacologic comfort measures Outcome: Adequate for Discharge   Problem: Health Behavior/Discharge Planning: Goal: Ability to manage health-related needs will improve Outcome: Adequate for Discharge   Problem: Clinical Measurements: Goal: Ability to maintain clinical measurements within normal limits will improve Outcome: Adequate for Discharge Goal: Will remain free from infection Outcome: Adequate for Discharge Goal: Diagnostic test results will improve Outcome: Adequate for Discharge Goal: Respiratory complications will improve Outcome: Adequate for Discharge Goal: Cardiovascular complication will be avoided Outcome: Adequate for Discharge   Problem: Activity: Goal: Risk for activity intolerance will decrease Outcome: Adequate for Discharge   Problem: Nutrition: Goal: Adequate nutrition will be maintained Outcome: Adequate for Discharge   Problem: Coping: Goal: Level of anxiety will decrease Outcome: Adequate for Discharge   Problem: Elimination: Goal: Will not experience complications related to bowel motility Outcome: Adequate for Discharge Goal: Will not experience complications related to urinary retention Outcome: Adequate for Discharge   Problem: Pain Managment: Goal: General experience of comfort will improve Outcome: Adequate for Discharge   Problem: Safety: Goal: Ability to remain free from injury will improve Outcome: Adequate for Discharge   Problem: Skin Integrity: Goal: Risk for impaired skin integrity will decrease Outcome: Adequate for Discharge   Problem: Education: Goal: Knowledge of [place] [facility] Education information/materials will improve Outcome: Adequate for Discharge Goal: Emotional status will improve Outcome: Adequate for Discharge Goal: Mental status will  improve Outcome: Adequate for Discharge Goal: Verbalization of understanding the information provided will improve Outcome: Adequate for Discharge   Problem: Activity: Goal: Interest or engagement in activities will improve Outcome: Adequate for Discharge Goal: Sleeping patterns will improve Outcome: Adequate for Discharge   Problem: Coping: Goal: Ability to verbalize frustrations and anger appropriately will improve Outcome: Adequate for Discharge Goal: Ability to demonstrate self-control will improve Outcome: Adequate for Discharge   Problem: Health Behavior/Discharge Planning: Goal: Identification of resources available to assist in meeting health care needs will improve Outcome: Adequate for Discharge Goal: Compliance with treatment plan for underlying cause of condition will improve Outcome: Adequate for Discharge   Problem: Physical Regulation: Goal: Ability to maintain clinical measurements within normal limits will improve Outcome: Adequate for Discharge   Problem: Safety: Goal: Periods of time without injury will increase Outcome: Adequate for Discharge   

## 2022-12-31 NOTE — [facility] Suicide Risk Assessment (Signed)
[facility] Discharge Suicide Risk Assessment   Principal Problem: Substance-induced delirium [facility]) Discharge Diagnoses: Principal Problem:   Substance-induced delirium ([place])   Total Time spent with patient: 30 minutes  Musculoskeletal: Strength & Muscle Tone: decreased Gait & Station: unsteady Patient leans: N/A  Psychiatric Specialty Exam  Presentation  General Appearance:  Appropriate for Environment  Eye Contact: Absent  Speech: Blocked  Speech Volume: Decreased  Handedness: Right   Mood and Affect  Mood: Dysphoric  Duration of Depression Symptoms: Less than two weeks  Affect: Congruent   Thought Process  Thought Processes: Disorganized  Descriptions of Associations:Circumstantial  Orientation:Partial  Thought Content:-- (unable to assess)  History of Schizophrenia/Schizoaffective disorder:-- (unable to assess)  Duration of Psychotic Symptoms:No data recorded Hallucinations:No data recorded Ideas of Reference:No data recorded Suicidal Thoughts:No data recorded Homicidal Thoughts:No data recorded  Sensorium  Memory:No data recorded Judgment:No data recorded Insight:No data recorded  Executive Functions  Concentration:No data recorded Attention Span:No data recorded Recall:No data recorded Fund of Knowledge:No data recorded Language:No data recorded  Psychomotor Activity  Psychomotor Activity:No data recorded  Assets  Assets:No data recorded  Sleep  Sleep:No data recorded  Physical Exam: Physical Exam Vitals and nursing note reviewed.  Constitutional:      Appearance: Normal appearance.  HENT:     Head: Normocephalic and atraumatic.     Mouth/Throat:     Pharynx: Oropharynx is clear.  Eyes:     Pupils: Pupils are equal, round, and reactive to light.  Cardiovascular:     Rate and Rhythm: Normal rate and regular rhythm.  Pulmonary:     Effort: Pulmonary effort is normal.     Breath sounds: Normal breath sounds.  Abdominal:      General: Abdomen is flat.     Palpations: Abdomen is soft.  Musculoskeletal:        General: Normal range of motion.     Comments: Right arm has very limited utility clearly affected by major surgery.  Chronic.  Patient has chronic pain and weakness and difficulty walking and is using a wheelchair.  Skin:    General: Skin is warm and dry.  Neurological:     General: No focal deficit present.     Mental Status: She is alert. Mental status is at baseline.  Psychiatric:        Attention and Perception: Attention normal.        Mood and Affect: Mood normal.        Speech: Speech normal.        Behavior: Behavior normal.        Thought Content: Thought content normal.        Cognition and Memory: Cognition normal.        Judgment: Judgment normal.    Review of Systems  Constitutional: Negative.   HENT: Negative.    Eyes: Negative.   Respiratory: Negative.    Cardiovascular: Negative.   Gastrointestinal: Negative.   Musculoskeletal:  Positive for joint pain.  Skin: Negative.   Neurological:  Positive for focal weakness and weakness.  Psychiatric/Behavioral: Negative.     Blood pressure (!) 115/94, pulse 91, temperature 98.5 F (36.9 C), temperature source Oral, resp. rate 18, height 5' 5"$  (1.651 m), weight 49 kg, SpO2 98 %. Body mass index is 17.97 kg/m.  Mental Status Per Nursing Assessment::   On Admission:  NA  Demographic Factors:  Caucasian  Loss Factors: Decline in physical health and Financial problems/change in socioeconomic status  Historical Factors: Impulsivity  Risk Reduction  Factors:   Living with another person, especially a relative, Positive social support, and Positive therapeutic relationship  Continued Clinical Symptoms:  Dysthymia Medical Diagnoses and Treatments/Surgeries  Cognitive Features That Contribute To Risk:  None    Suicide Risk:  Minimal: No identifiable suicidal ideation.  Patients presenting with no risk factors but with morbid  ruminations; may be classified as minimal risk based on the severity of the depressive symptoms    Plan Of Care/Follow-up recommendations:  Other:  Patient denies suicidal ideation.  She is calm and appropriate and lucid and agrees to recommendations for outpatient mental health follow-up.  No longer meets commitment criteria and can be discharged today.  Alethia Berthold, MD 12/31/2022, 10:43 AM

## 2022-12-31 NOTE — BH IP Treatment Plan (Signed)
Interdisciplinary Treatment and Diagnostic Plan Update  12/31/2022 Time of Session: 9:00AM Jlah Balthazar MRN: [id number]  Principal Diagnosis: Substance-induced delirium [facility])  Secondary Diagnoses: Principal Problem:   Substance-induced delirium ([place])   Current Medications:  Current Facility-Administered Medications  Medication Dose Route Frequency Provider Last Rate Last Admin   acetaminophen (TYLENOL) tablet 650 mg  650 mg Oral Q6H PRN Deloria Lair, NP   650 mg at 12/30/22 2152   alum & mag hydroxide-simeth (MAALOX/MYLANTA) 200-200-20 MG/5ML suspension 30 mL  30 mL Oral Q4H PRN Deloria Lair, NP       clonazePAM (KLONOPIN) tablet 1 mg  1 mg Oral BID Clapacs, John T, MD   1 mg at 12/31/22 [id number]   hydrOXYzine (ATARAX) tablet 25 mg  25 mg Oral TID PRN Deloria Lair, NP   25 mg at 12/30/22 2152   magnesium hydroxide (MILK OF MAGNESIA) suspension 30 mL  30 mL Oral Daily PRN Deloria Lair, NP       OLANZapine (ZYPREXA) tablet 10 mg  10 mg Oral Q6H PRN Parks Ranger, DO   10 mg at 12/30/22 2152   Or   OLANZapine (ZYPREXA) injection 10 mg  10 mg Intramuscular Q6H PRN Parks Ranger, DO       sertraline (ZOLOFT) tablet 100 mg  100 mg Oral Daily Clapacs, John T, MD   100 mg at 12/31/22 [id number]   traZODone (DESYREL) tablet 100 mg  100 mg Oral QHS Parks Ranger, DO   100 mg at 12/30/22 2153   PTA Medications: Medications Prior to Admission  Medication Sig Dispense Refill Last Dose   acetaminophen (TYLENOL) 500 MG tablet Take 2 tablets (1,000 mg total) by mouth every 8 (eight) hours. 30 tablet 0    Butalbital-APAP-Caffeine 50-325-40 MG capsule Take 1 capsule by mouth every 4 (four) hours as needed. (Patient not taking: Reported on 12/28/2022)      cefpodoxime (VANTIN) 200 MG tablet Take 1 tablet (200 mg total) by mouth 2 (two) times daily with a meal. (Patient not taking: Reported on 12/28/2022) 23 tablet 0    clonazePAM (KLONOPIN) 1 MG tablet Take 1 tablet  by mouth 2 (two) times daily.      Melatonin 5 MG TABS Take 5 mg by mouth at bedtime as needed.      mirtazapine (REMERON) 15 MG tablet Take 15 mg by mouth at bedtime. (Patient not taking: Reported on 12/28/2022)      ondansetron (ZOFRAN) 4 MG tablet Take 1 tablet (4 mg total) by mouth every 6 (six) hours as needed for nausea. (Patient not taking: Reported on 12/28/2022) 20 tablet 0    oxymetazoline (AFRIN) 0.05 % nasal spray Place 1 spray into both nostrils 2 (two) times daily as needed for congestion.      polyethylene glycol (MIRALAX / GLYCOLAX) packet Take 17 g by mouth every other day.      promethazine (PHENERGAN) 12.5 MG tablet Take 12.5 mg by mouth every 6 (six) hours as needed for nausea or vomiting. (Patient not taking: Reported on 12/28/2022)      QUEtiapine (SEROQUEL) 50 MG tablet Take 100 mg by mouth at bedtime.      sertraline (ZOLOFT) 100 MG tablet Take 200 mg by mouth at bedtime.      topiramate (TOPAMAX) 25 MG tablet Take 75 mg by mouth daily.      traZODone (DESYREL) 100 MG tablet Take 100 mg by mouth daily.       Patient  Stressors: Marital or family conflict    Patient Strengths: Motivation for treatment/growth   Treatment Modalities: Medication Management, Group therapy, Case management,  1 to 1 session with clinician, Psychoeducation, Recreational therapy.   Physician Treatment Plan for Primary Diagnosis: Substance-induced delirium ([place]) Long Term Goal(s): Improvement in symptoms so as ready for discharge   Short Term Goals: Ability to demonstrate self-control will improve Ability to identify and develop effective coping behaviors will improve  Medication Management: Evaluate patient's response, side effects, and tolerance of medication regimen.  Therapeutic Interventions: 1 to 1 sessions, Unit Group sessions and Medication administration.  Evaluation of Outcomes: Adequate for Discharge  Physician Treatment Plan for Secondary Diagnosis: Principal Problem:    Substance-induced delirium ([place])  Long Term Goal(s): Improvement in symptoms so as ready for discharge   Short Term Goals: Ability to demonstrate self-control will improve Ability to identify and develop effective coping behaviors will improve     Medication Management: Evaluate patient's response, side effects, and tolerance of medication regimen.  Therapeutic Interventions: 1 to 1 sessions, Unit Group sessions and Medication administration.  Evaluation of Outcomes: Adequate for Discharge   RN Treatment Plan for Primary Diagnosis: Substance-induced delirium ([place]) Long Term Goal(s): Knowledge of disease and therapeutic regimen to maintain health will improve  Short Term Goals: Ability to demonstrate self-control, Ability to participate in decision making will improve, Ability to verbalize feelings will improve, Ability to disclose and discuss suicidal ideas, Ability to identify and develop effective coping behaviors will improve, and Compliance with prescribed medications will improve  Medication Management: RN will administer medications as ordered by provider, will assess and evaluate patient's response and provide education to patient for prescribed medication. RN will report any adverse and/or side effects to prescribing provider.  Therapeutic Interventions: 1 on 1 counseling sessions, Psychoeducation, Medication administration, Evaluate responses to treatment, Monitor vital signs and CBGs as ordered, Perform/monitor CIWA, COWS, AIMS and Fall Risk screenings as ordered, Perform wound care treatments as ordered.  Evaluation of Outcomes: Adequate for Discharge   LCSW Treatment Plan for Primary Diagnosis: Substance-induced delirium ([place]) Long Term Goal(s): Safe transition to appropriate next level of care at discharge, Engage patient in therapeutic group addressing interpersonal concerns.  Short Term Goals: Engage patient in aftercare planning with referrals and resources, Increase  social support, Increase ability to appropriately verbalize feelings, Increase emotional regulation, Facilitate acceptance of mental health diagnosis and concerns, and Increase skills for wellness and recovery  Therapeutic Interventions: Assess for all discharge needs, 1 to 1 time with Social worker, Explore available resources and support systems, Assess for adequacy in community support network, Educate family and significant other(s) on suicide prevention, Complete Psychosocial Assessment, Interpersonal group therapy.  Evaluation of Outcomes: Adequate for Discharge   Progress in Treatment: Attending groups: No. Participating in groups: No. Taking medication as prescribed: Yes. Toleration medication: Yes. Family/Significant other contact made: No, will contact:  once permission is given. Patient understands diagnosis: Yes. Discussing patient identified problems/goals with staff: Yes. Medical problems stabilized or resolved: Yes. Denies suicidal/homicidal ideation: Yes. Issues/concerns per patient self-inventory: No. Other: none  New problem(s) identified: No, Describe:  none  New Short Term/Long Term Goal(s): detox, elimination of symptoms of psychosis, medication management for mood stabilization; elimination of SI thoughts; development of comprehensive mental wellness/sobriety plan.   Patient Goals:  Patient was present in treatment team and engaged in discussions.  However, patient was unable to provide a goal.  Patient ruminated on her desire to go home and was apologetic for her  agitation the day before.   Discharge Plan or Barriers: CSW to assist in the development of appropriate discharge plans.   Reason for Continuation of Hospitalization: Anxiety Depression Medication stabilization Suicidal ideation  Estimated Length of Stay:  1-7 days  Last 3 [place] Suicide Severity Risk Score: Flowsheet Row Admission (Current) from 12/29/2022 in [place] ED  from 12/27/2022 in [facility] Emergency Department at [facility] ED to Hosp-Admission (Discharged) from 11/25/2021 in [place] (1A)  [place] High Risk High Risk No Risk       Last PHQ 2/9 Scores:     No data to display          Scribe for Treatment Team: Rozann Lesches, LCSW 12/31/2022 9:27 AM

## 2022-12-31 NOTE — BHH Group Notes (Signed)
[place] Group Notes:  (Nursing/MHT/Case Management/Adjunct)  Date:  12/31/2022  Time:  9:52 AM  Type of Therapy:   Community Meeting  Participation Level:  Did Not Attend  Participation Quality:    Affect:    Cognitive:    Insight:    Engagement in Group:    Modes of Intervention:    Summary of Progress/Problems:  Chelsea Stanley 12/31/2022, 9:52 AM
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
## 2023-01-01 NOTE — Progress Notes (Signed)
  [facility] Adult Case Management Discharge Plan :  Will you be returning to the same living situation after discharge:  Yes,  pt to return home. At discharge, do you have transportation home?: Yes,  pt spouse to provide transportation. Do you have the ability to pay for your medications: Yes,  [employer].  Release of information consent forms completed and in the chart;  Patient's signature needed at discharge.  Patient to Follow up at:  Follow-up Information     [place]. Go on 01/21/2023.   Why: Please present for scheduled appointment on 3/6 at 1400 PM. Contact information: [address] [address] [phone]                 Next level of care provider has access to [place] and Suicide Prevention discussed: Yes,  SPE completed with pt.     Has patient been referred to the Quitline?: Patient refused referral  Patient has been referred for addiction treatment: Pt. refused referral  Shirl Harris, LCSW 01/01/2023, 9:38 AM

## 2023-02-27 ENCOUNTER — Other Ambulatory Visit: Payer: Self-pay

## 2023-02-27 ENCOUNTER — Emergency Department
Admission: EM | Admit: 2023-02-27 | Discharge: 2023-02-27 | Disposition: A | Discharge status: Home / Self Care | Payer: 59 | Attending: Emergency Medicine | Admitting: Emergency Medicine

## 2023-02-27 DIAGNOSIS — R569 Unspecified convulsions: Secondary | ICD-10-CM | POA: Insufficient documentation

## 2023-02-27 NOTE — ED Provider Notes (Signed)
[facility] Provider Note    Event Date/Time   First MD Initiated Contact with Patient 02/27/23 1228     (approximate)   History   Seizures   HPI  Chelsea Stanley is a 57 y.o. female with a history of a seizure disorder and mood disorder who presents with apparent seizure-like activity acute onset this afternoon.  Per EMS, the patient's partner was being transported to the hospital.  When the patient found out that she would not be able to ride along with EMS, she became acutely anxious and agitated.  She is subsequently went into a seizure-like episode involving full body convulsions although per EMS it did not appear like a typical seizure.  Subsequently the patient became very agitated and combative.  She received a total of 5 mg of Versed and 5 mg of Haldol.  She is now calm and sleepy.  The patient denies any acute complaints.  She reports that she is on Topamax for seizures as well as several mental health medications.   I reviewed the past medical records.  The patient was admitted in January 2023 for sepsis.  Subsequently she was admitted to inpatient psychiatry in February of this year for substance-induced delirium.  Physical Exam   Triage Vital Signs: ED Triage Vitals  Enc Vitals Group     BP      Pulse      Resp      Temp      Temp src      SpO2      Weight      Height      Head Circumference      Peak Flow      Pain Score      Pain Loc      Pain Edu?      Excl. in GC?     Most recent vital signs: Vitals:   02/27/23 1233  BP: (!) 81/60  Pulse: 66  Resp: 10  Temp: 98.6 F (37 C)  SpO2: 96%     General: Sleepy appearing but fully arousable, no distress.  CV:  Good peripheral perfusion.  Resp:  Normal effort.  Abd:  No distention.  Other:  EOMI.  PERRLA.  Normal speech.  Motor intact in all extremities.  Normal coordination.  No visible trauma.   ED Results / Procedures / Treatments   Labs (all labs ordered are listed,  but only abnormal results are displayed) Labs Reviewed - No data to display   EKG   RADIOLOGY   PROCEDURES:  Critical Care performed: No  Procedures   MEDICATIONS ORDERED IN ED: Medications - No data to display   IMPRESSION / MDM / ASSESSMENT AND PLAN / ED COURSE  I reviewed the triage vital signs and the nursing notes.  57 year old female with a documented history of a seizure disorder presents with an episode of seizure-like activity after becoming upset.  Per EMS the family member reported that similar episodes have happened previously in a similar context.  Currently on exam the patient is slightly sleepy after getting Versed and Haldol but is fully arousable.  Neurologic exam is nonfocal.  Differential diagnosis includes, but is not limited to, epileptic seizure, nonepileptic seizure, acute anxiety.  The patient states she is feeling much better and is hoping that she will be able to be discharged soon.  At this time I do not see any specific indication for lab workup or imaging.  The patient has been compliant  with her medications.  We will observe her for the next 1 to 2 hours and plan for discharge when she returns to baseline.  Patient's presentation is most consistent with acute presentation with potential threat to life or bodily function.  The patient is on the cardiac monitor to evaluate for evidence of arrhythmia and/or significant heart rate changes.   ----------------------------------------- 1:39 PM on 02/27/2023 -----------------------------------------  The patient is fully alert and oriented, ambulating without difficulty, and states she is back to baseline.  She is stable for discharge at this time.  I gave her return precautions and she expressed understanding.  FINAL CLINICAL IMPRESSION(S) / ED DIAGNOSES   Final diagnoses:  Seizure-like activity     Rx / DC Orders   ED Discharge Orders     None        Note:  This document was prepared  using Dragon voice recognition software and may include unintentional dictation errors.    Dionne Bucy, MD 02/27/23 1340
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
## 2023-11-24 DIAGNOSIS — F445 Conversion disorder with seizures or convulsions: Secondary | ICD-10-CM | POA: Insufficient documentation

## 2023-12-17 ENCOUNTER — Emergency Department: Payer: 59

## 2023-12-17 ENCOUNTER — Emergency Department
Admission: EM | Admit: 2023-12-17 | Discharge: 2023-12-17 | Disposition: A | Discharge status: Home / Self Care | Payer: 59 | Attending: Emergency Medicine | Admitting: Emergency Medicine

## 2023-12-17 ENCOUNTER — Other Ambulatory Visit: Payer: Self-pay

## 2023-12-17 DIAGNOSIS — G9059 Complex regional pain syndrome I of other specified site: Secondary | ICD-10-CM | POA: Diagnosis not present

## 2023-12-17 DIAGNOSIS — L03115 Cellulitis of right lower limb: Secondary | ICD-10-CM | POA: Insufficient documentation

## 2023-12-17 DIAGNOSIS — M79671 Pain in right foot: Secondary | ICD-10-CM

## 2023-12-17 LAB — CBC WITH DIFFERENTIAL/PLATELET
Abs Immature Granulocytes: 0.01 10*3/uL (ref 0.00–0.07)
Basophils Absolute: 0 10*3/uL (ref 0.0–0.1)
Basophils Relative: 1 %
Eosinophils Absolute: 0 10*3/uL (ref 0.0–0.5)
Eosinophils Relative: 0 %
HCT: 38.7 % (ref 36.0–46.0)
Hemoglobin: 12.8 g/dL (ref 12.0–15.0)
Immature Granulocytes: 0 %
Lymphocytes Relative: 37 %
Lymphs Abs: 2 10*3/uL (ref 0.7–4.0)
MCH: 28.5 pg (ref 26.0–34.0)
MCHC: 33.1 g/dL (ref 30.0–36.0)
MCV: 86.2 fL (ref 80.0–100.0)
Monocytes Absolute: 0.3 10*3/uL (ref 0.1–1.0)
Monocytes Relative: 6 %
Neutro Abs: 2.9 10*3/uL (ref 1.7–7.7)
Neutrophils Relative %: 56 %
Platelets: 208 10*3/uL (ref 150–400)
RBC: 4.49 MIL/uL (ref 3.87–5.11)
RDW: 15.2 % (ref 11.5–15.5)
WBC: 5.2 10*3/uL (ref 4.0–10.5)
nRBC: 0 % (ref 0.0–0.2)

## 2023-12-17 LAB — BASIC METABOLIC PANEL
Anion gap: 10 (ref 5–15)
BUN: 12 mg/dL (ref 6–20)
CO2: 20 mmol/L — ABNORMAL LOW (ref 22–32)
Calcium: 8.7 mg/dL — ABNORMAL LOW (ref 8.9–10.3)
Chloride: 110 mmol/L (ref 98–111)
Creatinine, Ser: 0.9 mg/dL (ref 0.44–1.00)
GFR, Estimated: >60 mL/min (ref >60)
Glucose, Bld: 94 mg/dL (ref 70–99)
Potassium: 3.6 mmol/L (ref 3.5–5.1)
Sodium: 140 mmol/L (ref 135–145)

## 2023-12-17 LAB — CK: Total CK: 100 U/L (ref 38–234)

## 2023-12-17 MED ORDER — OXYCODONE HCL 5 MG PO TABS: 5.0000 mg | ORAL_TABLET | Freq: Once | ORAL | Status: DC

## 2023-12-17 MED ORDER — HYDROMORPHONE HCL 1 MG/ML IJ SOLN
0.5000 mg | Freq: Once | INTRAMUSCULAR | Status: AC
  Administered 2023-12-17: 0.5 mg via INTRAVENOUS
  Filled 2023-12-17: qty 0.5

## 2023-12-17 MED ORDER — IOHEXOL 300 MG/ML  SOLN
100.0000 mL | Freq: Once | INTRAMUSCULAR | Status: AC | PRN
  Administered 2023-12-17: 100 mL via INTRAVENOUS

## 2023-12-17 MED ORDER — DOXYCYCLINE HYCLATE 100 MG PO TABS: 100.0000 mg | ORAL_TABLET | Freq: Two times a day (BID) | ORAL | 0 refills | Status: AC

## 2023-12-17 MED ORDER — ONDANSETRON HCL 4 MG/2ML IJ SOLN
4.0000 mg | Freq: Once | INTRAMUSCULAR | Status: AC
  Administered 2023-12-17: 4 mg via INTRAVENOUS
  Filled 2023-12-17: qty 2

## 2023-12-17 MED ORDER — OXYCODONE HCL 5 MG PO TABS: 5.0000 mg | ORAL_TABLET | Freq: Four times a day (QID) | ORAL | 0 refills | Status: AC | PRN

## 2023-12-17 NOTE — ED Notes (Signed)
Ultrasound is at bedside

## 2023-12-17 NOTE — ED Provider Triage Note (Signed)
Emergency Medicine Provider Triage Evaluation Note  Chelsea Stanley , a 58 y.o. female  was evaluated in triage.  Pt complains of right foot, ankle, and calf pain after fall about a month ago. For the past week, foot has been cool to touch and she is unable to bear weight, move the toes, or flex foot.  Physical Exam  BP 112/69   Pulse 69   Temp 98.5 F (36.9 C)   Resp 16   Wt 47.6 kg   SpO2 100%   BMI 17.46 kg/m  Gen:   Awake, no distress   Resp:  Normal effort  MSK:   Right foot and ankle edematous, cool, and too painful to touch to feel for DP pulse. Other:    Medical Decision Making  Medically screening exam initiated at 4:24 PM.  Appropriate orders placed.  [name] was informed that the remainder of the evaluation will be completed by another provider, this initial triage assessment does not replace that evaluation, and the importance of remaining in the ED until their evaluation is complete.  Concern for compartment syndrome. First nurse aware of need for bed.   [name], FNP 12/17/23 1627

## 2023-12-17 NOTE — ED Notes (Signed)
Report given to [facility]

## 2023-12-17 NOTE — Discharge Instructions (Addendum)
Continue gabapetin STOP tramadol/fioricet and start oxycodone Call your pain doctor to follow-up Try to stay off of it.  CT imaging without any fractures or obvious abscess.  Start antibiotics in case there is a cellulitis that is forming Follow-up with podiatry as well as needed.  Return to the ER for worsening symptoms or any other concerns  MPRESSION: No acute fracture is noted.   Soft tissue swelling with pockets of edema in the dorsum of the foot. No discrete abscess is noted at this time.

## 2023-12-17 NOTE — ED Triage Notes (Signed)
Arrives with right foot pain.  States fell one month ago, foot continue to swell, skin flaky, ankle swelling and leg swelling as well.

## 2023-12-17 NOTE — ED Provider Notes (Signed)
[facility] Provider Note    Event Date/Time   First MD Initiated Contact with Patient 12/17/23 1656     (approximate)   History   No chief complaint on file.   HPI  Chelsea Stanley is a 58 y.o. female with history of complex regional pain syndrome of the right upper extremity.  Patient has a history of necrotizing cellulitis and severe sepsis with septic shock.  She went to the OR went I&D of the wound.  Patient reports that she thinks about 2 weeks ago she hit her foot.  She did have some bruising at that time.  She reports just increasing pain to the foot over the past few days.  She reports the whole foot feels numb.  Physical Exam   Triage Vital Signs: ED Triage Vitals  Encounter Vitals Group     BP 12/17/23 1609 112/69     Systolic BP Percentile --      Diastolic BP Percentile --      Pulse Rate 12/17/23 1609 69     Resp 12/17/23 1609 16     Temp 12/17/23 1609 98.5 F (36.9 C)     Temp src --      SpO2 12/17/23 1609 100 %     Weight 12/17/23 1608 104 lb 15 oz (47.6 kg)     Height --      Head Circumference --      Peak Flow --      Pain Score 12/17/23 1608 7     Pain Loc --      Pain Education --      Exclude from Growth Chart --     Most recent vital signs: Vitals:   12/17/23 1609  BP: 112/69  Pulse: 69  Resp: 16  Temp: 98.5 F (36.9 C)  SpO2: 100%     General: Awake, no distress.  CV:  Good peripheral perfusion.  Resp:  Normal effort.  Abd:  No distention.  Other:  Patient has significant pain to the right foot she is got good distal pulses DP and PT confirmed with Doppler ultrasound.  She is got some swelling that goes up into the calf and most of her pain is on her midfoot   ED Results / Procedures / Treatments   Labs (all labs ordered are listed, but only abnormal results are displayed) Labs Reviewed  CBC WITH DIFFERENTIAL/PLATELET  CK  BASIC METABOLIC PANEL      RADIOLOGY I have reviewed the xray  personally and no fracture noted.  PROCEDURES:  Critical Care performed: No  Procedures   MEDICATIONS ORDERED IN ED: Medications  oxyCODONE (Oxy IR/ROXICODONE) immediate release tablet 5 mg (5 mg Oral Not Given 12/17/23 1826)  HYDROmorphone (DILAUDID) injection 0.5 mg (0.5 mg Intravenous Given 12/17/23 1847)  ondansetron (ZOFRAN) injection 4 mg (4 mg Intravenous Given 12/17/23 1844)  iohexol (OMNIPAQUE) 300 MG/ML solution 100 mL (100 mLs Intravenous Contrast Given 12/17/23 2009)     IMPRESSION / MDM / ASSESSMENT AND PLAN / ED COURSE  I reviewed the triage vital signs and the nursing notes.   Patient's presentation is most consistent with acute presentation with potential threat to life or bodily function.   Suspect swelling related to injury although it has been over 2 weeks.  I considered necrotizing fasciitis given her prior history of this but this has been a gradual process over 2 weeks.  She reports that when she had that previously it was over a few  days.  This could be just related to her complex regional pain syndrome.  The triage PA was worried about potential compartment syndrome.  Labs ordered evaluate for myositis, infection, ultrasound to rule out DVT x-ray to rule out fracture.  Discussed with Dr Beecher Mcardle this is most likely complex regional pain syndrome after reviewing the pictures but did recommend I discussed with podiatry to ensure there was nothing that would need to be done inpatient before parents perspective low suspicion for needing inpatient evaluation  7:30 PM discussed with Dr. Annamary Rummage does not feel like patient needs MRI given this does not seem consistent with necrotizing fasciitis.  Given the bruising and the trauma this is most likely consistent with complex regional pain syndrome.  Given the concern for potential like an abscess being missed I will get CT imaging  CT imaging overall reassuring given reassuring blood work I do not feel like we need  to get MRI discussed with patient she felt comfortable with symptomatic treatment and following up outpatient.  Case there is developing cellulitis I will cover with some antibiotics.  Patient already has gabapentin at home but states she is almost out so we will prescribe some gabapentin, oxycodone.  She understands not to use with tramadol or Fioricet and to follow-up with her pain clinic doctor.  Patient provided a walking boot and crutches to stay off of it and will follow-up outpatient with podiatry, pain doctor.  FINAL CLINICAL IMPRESSION(S) / ED DIAGNOSES   Final diagnoses:  Cellulitis of right lower extremity  Foot pain, right  Complex regional pain syndrome type 1 affecting other site     Rx / DC Orders   ED Discharge Orders          Ordered    oxyCODONE (ROXICODONE) 5 MG immediate release tablet  Every 6 hours PRN        12/17/23 2219    doxycycline (VIBRA-TABS) 100 MG tablet  2 times daily        12/17/23 2221             Note:  This document was prepared using Dragon voice recognition software and may include unintentional dictation errors.   Concha Se, MD 12/17/23 2221

## 2024-01-06 ENCOUNTER — Ambulatory Visit: Payer: Self-pay | Admitting: Family Medicine

## 2024-01-25 ENCOUNTER — Other Ambulatory Visit: Payer: Self-pay

## 2024-01-25 ENCOUNTER — Emergency Department
Admission: EM | Admit: 2024-01-25 | Discharge: 2024-01-27 | Disposition: A | Discharge status: Home / Self Care | Attending: Emergency Medicine | Admitting: Emergency Medicine

## 2024-01-25 DIAGNOSIS — F39 Unspecified mood [affective] disorder: Secondary | ICD-10-CM | POA: Diagnosis not present

## 2024-01-25 DIAGNOSIS — F445 Conversion disorder with seizures or convulsions: Secondary | ICD-10-CM | POA: Diagnosis present

## 2024-01-25 DIAGNOSIS — F419 Anxiety disorder, unspecified: Secondary | ICD-10-CM | POA: Diagnosis not present

## 2024-01-25 DIAGNOSIS — R4182 Altered mental status, unspecified: Secondary | ICD-10-CM

## 2024-01-25 DIAGNOSIS — F444 Conversion disorder with motor symptom or deficit: Secondary | ICD-10-CM

## 2024-01-25 DIAGNOSIS — R29818 Other symptoms and signs involving the nervous system: Secondary | ICD-10-CM | POA: Diagnosis not present

## 2024-01-25 DIAGNOSIS — Z79899 Other long term (current) drug therapy: Secondary | ICD-10-CM | POA: Diagnosis not present

## 2024-01-25 LAB — POC URINE PREG, ED: Preg Test, Ur: NEGATIVE

## 2024-01-25 LAB — URINALYSIS, ROUTINE W REFLEX MICROSCOPIC
Bacteria, UA: NONE SEEN
Bilirubin Urine: NEGATIVE
Glucose, UA: NEGATIVE mg/dL
Ketones, ur: NEGATIVE mg/dL
Leukocytes,Ua: NEGATIVE
Nitrite: NEGATIVE
Protein, ur: 30 mg/dL — AB
Specific Gravity, Urine: 1.021 (ref 1.005–1.030)
pH: 5 (ref 5.0–8.0)

## 2024-01-25 LAB — ETHANOL: Alcohol, Ethyl (B): <10 mg/dL (ref <10)

## 2024-01-25 LAB — CBC WITH DIFFERENTIAL/PLATELET
Abs Immature Granulocytes: 0.01 10*3/uL (ref 0.00–0.07)
Basophils Absolute: 0 10*3/uL (ref 0.0–0.1)
Basophils Relative: 1 %
Eosinophils Absolute: 0 10*3/uL (ref 0.0–0.5)
Eosinophils Relative: 1 %
HCT: 40.6 % (ref 36.0–46.0)
Hemoglobin: 13.4 g/dL (ref 12.0–15.0)
Immature Granulocytes: 0 %
Lymphocytes Relative: 42 %
Lymphs Abs: 1.6 10*3/uL (ref 0.7–4.0)
MCH: 28.2 pg (ref 26.0–34.0)
MCHC: 33 g/dL (ref 30.0–36.0)
MCV: 85.3 fL (ref 80.0–100.0)
Monocytes Absolute: 0.3 10*3/uL (ref 0.1–1.0)
Monocytes Relative: 7 %
Neutro Abs: 1.9 10*3/uL (ref 1.7–7.7)
Neutrophils Relative %: 49 %
Platelets: 194 10*3/uL (ref 150–400)
RBC: 4.76 MIL/uL (ref 3.87–5.11)
RDW: 13.4 % (ref 11.5–15.5)
WBC: 3.8 10*3/uL — ABNORMAL LOW (ref 4.0–10.5)
nRBC: 0 % (ref 0.0–0.2)

## 2024-01-25 LAB — COMPREHENSIVE METABOLIC PANEL
ALT: 9 U/L (ref 0–44)
AST: 21 U/L (ref 15–41)
Albumin: 3.8 g/dL (ref 3.5–5.0)
Alkaline Phosphatase: 78 U/L (ref 38–126)
Anion gap: 9 (ref 5–15)
BUN: 9 mg/dL (ref 6–20)
CO2: 19 mmol/L — ABNORMAL LOW (ref 22–32)
Calcium: 8.8 mg/dL — ABNORMAL LOW (ref 8.9–10.3)
Chloride: 111 mmol/L (ref 98–111)
Creatinine, Ser: 0.95 mg/dL (ref 0.44–1.00)
GFR, Estimated: >60 mL/min (ref >60)
Glucose, Bld: 94 mg/dL (ref 70–99)
Potassium: 4.1 mmol/L (ref 3.5–5.1)
Sodium: 139 mmol/L (ref 135–145)
Total Bilirubin: 1 mg/dL (ref 0.0–1.2)
Total Protein: 6.9 g/dL (ref 6.5–8.1)

## 2024-01-25 LAB — URINE DRUG SCREEN, QUALITATIVE (ARMC ONLY)
Amphetamines, Ur Screen: POSITIVE — AB
Barbiturates, Ur Screen: NOT DETECTED
Benzodiazepine, Ur Scrn: POSITIVE — AB
Cannabinoid 50 Ng, Ur ~~LOC~~: NOT DETECTED
Cocaine Metabolite,Ur ~~LOC~~: NOT DETECTED
MDMA (Ecstasy)Ur Screen: NOT DETECTED
Methadone Scn, Ur: NOT DETECTED
Opiate, Ur Screen: NOT DETECTED
Phencyclidine (PCP) Ur S: NOT DETECTED
Tricyclic, Ur Screen: POSITIVE — AB

## 2024-01-25 LAB — ACETAMINOPHEN LEVEL: Acetaminophen (Tylenol), Serum: <10 ug/mL — ABNORMAL LOW (ref 10–30)

## 2024-01-25 LAB — CK: Total CK: 63 U/L (ref 38–234)

## 2024-01-25 LAB — SALICYLATE LEVEL: Salicylate Lvl: <7 mg/dL — ABNORMAL LOW (ref 7.0–30.0)

## 2024-01-25 MED ORDER — GABAPENTIN 300 MG PO CAPS
300.0000 mg | ORAL_CAPSULE | Freq: Three times a day (TID) | ORAL | Status: DC
  Administered 2024-01-25 – 2024-01-27 (×5): 300 mg via ORAL
  Filled 2024-01-25 (×5): qty 1

## 2024-01-25 MED ORDER — TOPIRAMATE 25 MG PO TABS
100.0000 mg | ORAL_TABLET | Freq: Two times a day (BID) | ORAL | Status: DC
  Administered 2024-01-25 – 2024-01-27 (×4): 100 mg via ORAL
  Filled 2024-01-25 (×4): qty 4

## 2024-01-25 MED ORDER — HYDROXYZINE HCL 25 MG PO TABS
25.0000 mg | ORAL_TABLET | Freq: Three times a day (TID) | ORAL | Status: DC | PRN
  Administered 2024-01-25 – 2024-01-26 (×2): 25 mg via ORAL
  Filled 2024-01-25 (×2): qty 1

## 2024-01-25 MED ORDER — MELATONIN 5 MG PO TABS
5.0000 mg | ORAL_TABLET | Freq: Every day | ORAL | Status: DC
Start: 2024-01-25 — End: 2024-01-27
  Administered 2024-01-25 – 2024-01-26 (×2): 5 mg via ORAL
  Filled 2024-01-25 (×2): qty 1

## 2024-01-25 MED ORDER — SERTRALINE HCL 50 MG PO TABS
100.0000 mg | ORAL_TABLET | Freq: Every day | ORAL | Status: DC
  Administered 2024-01-26 – 2024-01-27 (×2): 100 mg via ORAL
  Filled 2024-01-25 (×2): qty 2

## 2024-01-25 MED ORDER — ACETAMINOPHEN 500 MG PO TABS
1000.0000 mg | ORAL_TABLET | Freq: Four times a day (QID) | ORAL | Status: DC | PRN
  Administered 2024-01-25 – 2024-01-27 (×4): 1000 mg via ORAL
  Filled 2024-01-25 (×4): qty 2

## 2024-01-25 MED ORDER — TRAZODONE HCL 50 MG PO TABS
150.0000 mg | ORAL_TABLET | Freq: Every day | ORAL | Status: DC
  Administered 2024-01-25 – 2024-01-26 (×2): 150 mg via ORAL
  Filled 2024-01-25 (×2): qty 1

## 2024-01-25 MED ORDER — QUETIAPINE FUMARATE 25 MG PO TABS
50.0000 mg | ORAL_TABLET | Freq: Two times a day (BID) | ORAL | Status: DC
  Administered 2024-01-25 – 2024-01-27 (×4): 50 mg via ORAL
  Filled 2024-01-25 (×4): qty 2

## 2024-01-25 NOTE — ED Notes (Signed)
 This tech obtained vital signs on pt.

## 2024-01-25 NOTE — ED Notes (Signed)
TTS at patient bedside 

## 2024-01-25 NOTE — BH Assessment (Signed)
 Comprehensive Clinical Assessment (CCA) Screening, Triage and Referral Note  01/25/2024 Chelsea Stanley [id number] Recommendations for Services/Supports/Treatments: Disposition pending Chelsea Stanley "Chelsea Stanley" is a 58 y.o., Caucasian race, Not Hispanic or Latino ethnicity, ENGLISH speaking female who presented ED voluntarily for an evaluation. Per triage note: Patient to ED via [facility]. Patient was walking dog in front of car lot when a bystander called EMS stating patient had "seizure-like" activity. EMS reports patient was acting boisterous in route, jumping up and down on stretcher and trying to bite staff. Patient received 2.5 versed and 4mg  Zofran. Patient has Hx of seizures and breast cancer.  On assessment the pt. was soundly. Pt admitted that she couldn't recall why she'd been brought to the hospital. Pt reported that she'd been walking her dogs and the dogs pulled her down into a ditch with a lot of rocks. Pt also explained that she'd had an incident with her dogs a few weeks ago, which is the cause for her having a boot. Pt is homeless and reported that she and her boyfriend are staying in hotels due their losing their home to an [place]. The pt reported that she is not receiving any psych services. The pt expressed feeling sad at times; however, pt denied having SI. The pt endorsed having severe anxiety and agoraphobia. Pt also reported having memory impairments due to having a stroke in the past. The pt. had good insight and impaired judgement. Pt was not responding to internal/external stimuli. Pt had clear speech; thoughts were linear. Pt was oriented x4. Pt presented with a depressed mood; affect was congruent. Pt's BAL is unremarkable; UDS is positive for amphetamines, Benzodiazepine, and Cannabinoid.  Flowsheet Row ED from 01/25/2024 in [facility] Emergency Department at [facility] ED from 12/17/2023 in [facility] Emergency Department at [facility] ED from 02/27/2023 in [facility] Emergency Department at [facility]  C-SSRS RISK CATEGORY No Risk No Risk No Risk       Chief Complaint:  Chief Complaint  Patient presents with   Seizures   Visit Diagnosis: Dx Deferred  Patient Reported Information How did you hear about [place]? No data recorded What Is the Reason for Your Visit/Call Today? No data recorded How Long Has This Been Causing You Problems? No data recorded What Do You Feel Would Help You the Most Today? No data recorded  Have You Recently Had Any Thoughts About Hurting Yourself? No data recorded Are You Planning to Commit Suicide/Harm Yourself At This time? No data recorded  Have you Recently Had Thoughts About Hurting Someone Karolee Ohs? No data recorded Are You Planning to Harm Someone at This Time? No data recorded Explanation: No data recorded  Have You Used Any Alcohol or Drugs in the Past 24 Hours? No data recorded How Long Ago Did You Use Drugs or Alcohol? No data recorded What Did You Use and How Much? No data recorded  Do You Currently Have a Therapist/Psychiatrist? No data recorded Name of Therapist/Psychiatrist: No data recorded  Have You Been Recently Discharged From Any Office Practice or Programs? No data recorded Explanation of Discharge From Practice/Program: No data recorded   CCA Screening Triage Referral Assessment Type of Contact: No data recorded Telemedicine Service Delivery:   Is this Initial or Reassessment?   Date Telepsych consult ordered in [facility]:    Time Telepsych consult ordered in [facility]:    Location of Assessment: No data recorded Provider Location: No data recorded   Collateral Involvement: No data recorded  Does Patient Have  a Automotive engineer Guardian? No data recorded Name and Contact of Legal Guardian: No data recorded If Minor and Not Living with Parent(s), Who has Custody? No data recorded Is CPS involved or ever been involved? No data recorded Is APS involved or ever been involved? No data  recorded  Patient Determined To Be At Risk for Harm To Self or Others Based on Review of Patient Reported Information or Presenting Complaint? No data recorded Method: No data recorded Availability of Means: No data recorded Intent: No data recorded Notification Required: No data recorded Additional Information for Danger to Others Potential: No data recorded Additional Comments for Danger to Others Potential: No data recorded Are There Guns or Other Weapons in Your Home? No data recorded Types of Guns/Weapons: No data recorded Are These Weapons Safely Secured?                            No data recorded Who Could Verify You Are Able To Have These Secured: No data recorded Do You Have any Outstanding Charges, Pending Court Dates, Parole/Probation? No data recorded Contacted To Inform of Risk of Harm To Self or Others: No data recorded  Does Patient Present under Involuntary Commitment? No data recorded   [place] of Residence: No data recorded  Patient Currently Receiving the Following Services: No data recorded  Determination of Need: No data recorded  Options For Referral: No data recorded  Disposition Recommendation per psychiatric provider:   Foy [place], LCAS

## 2024-01-25 NOTE — ED Provider Notes (Signed)
 [facility] Provider Note   Event Date/Time   First MD Initiated Contact with Patient 01/25/24 1422     (approximate) History  Seizures  HPI Chelsea Stanley is a 58 y.o. female with a past medical history of major depressive disorder, substance-induced delirium and psychogenic nonepileptiform seizures who presents via EMS after she was reportedly walking a dog in front of a car lot when a bystander called EMS due to patient having seizure-like activity.  EMS states that patient was acting erratic, boisterous, jumping up and down, and trying to bite staff for which she received 2.5 mg of Versed and 4 mg Zofran.  Patient is difficult to determine an accurate history and often stating "I do not know" to questions that she will answer later.  Patient is currently only oriented to herself and states that she has a history of seizures and breast cancer for which she does not know if she is taking treatment. ROS: Unable to assess reliably   Physical Exam  Triage Vital Signs: ED Triage Vitals  Encounter Vitals Group     BP      Systolic BP Percentile      Diastolic BP Percentile      Pulse      Resp      Temp      Temp src      SpO2      Weight      Height      Head Circumference      Peak Flow      Pain Score      Pain Loc      Pain Education      Exclude from Growth Chart    Most recent vital signs: Vitals:   01/25/24 1428 01/25/24 1953  BP: 113/68 (!) 100/53  Pulse: 76 66  Resp: 17 18  Temp: 97.6 F (36.4 C) 97.9 F (36.6 C)  SpO2: 97% 92%   General: Awake, oriented x1. CV:  Good peripheral perfusion.  Resp:  Normal effort.  Abd:  No distention.  Other:  Middle-aged well-developed, well-nourished Caucasian female resting comfortably in no acute distress.  Emotionally labile ED Results / Procedures / Treatments  Labs (all labs ordered are listed, but only abnormal results are displayed) Labs Reviewed  COMPREHENSIVE METABOLIC PANEL - Abnormal;  Notable for the following components:      Result Value   CO2 19 (*)    Calcium 8.8 (*)    All other components within normal limits  CBC WITH DIFFERENTIAL/PLATELET - Abnormal; Notable for the following components:   WBC 3.8 (*)    All other components within normal limits  URINE DRUG SCREEN, QUALITATIVE ([facility] ONLY) - Abnormal; Notable for the following components:   Tricyclic, Ur Screen POSITIVE (*)    Amphetamines, Ur Screen POSITIVE (*)    Benzodiazepine, Ur Scrn POSITIVE (*)    All other components within normal limits  URINALYSIS, ROUTINE W REFLEX MICROSCOPIC - Abnormal; Notable for the following components:   Color, Urine AMBER (*)    APPearance CLOUDY (*)    Hgb urine dipstick SMALL (*)    Protein, ur 30 (*)    All other components within normal limits  ACETAMINOPHEN LEVEL - Abnormal; Notable for the following components:   Acetaminophen (Tylenol), Serum <10 (*)    All other components within normal limits  SALICYLATE LEVEL - Abnormal; Notable for the following components:   Salicylate Lvl <7.0 (*)    All other components  within normal limits  POC URINE PREG, ED - Normal  CK  ETHANOL   EKG ED ECG REPORT I, Merwyn Katos, the attending physician, personally viewed and interpreted this ECG. Date: 01/25/2024 EKG Time: 1424 Rate: 75 Rhythm: normal sinus rhythm QRS Axis: normal Intervals: normal ST/T Wave abnormalities: normal Narrative Interpretation: no evidence of acute ischemia  PROCEDURES: Critical Care performed: No Procedures MEDICATIONS ORDERED IN ED: Medications  acetaminophen (TYLENOL) tablet 1,000 mg (1,000 mg Oral Given 01/26/24 0510)  gabapentin (NEURONTIN) capsule 300 mg (300 mg Oral Given 01/25/24 2102)  hydrOXYzine (ATARAX) tablet 25 mg (25 mg Oral Given 01/25/24 2101)  QUEtiapine (SEROQUEL) tablet 50 mg (50 mg Oral Given 01/25/24 2101)  sertraline (ZOLOFT) tablet 100 mg (has no administration in time range)  topiramate (TOPAMAX) tablet 100 mg (100 mg  Oral Given 01/25/24 2100)  melatonin tablet 5 mg (5 mg Oral Given 01/25/24 2102)  traZODone (DESYREL) tablet 150 mg (150 mg Oral Given 01/25/24 2102)   IMPRESSION / MDM / ASSESSMENT AND PLAN / ED COURSE  I reviewed the triage vital signs and the nursing notes.                             The patient is on the cardiac monitor to evaluate for evidence of arrhythmia and/or significant heart rate changes. Patient's presentation is most consistent with acute presentation with potential threat to life or bodily function. This patient presents with symptoms consistent with acute anxiety reaction / panic attack/PNES. Low suspicion for acute cardiopulmonary process including ACS, PE, or thoracic aortic dissection. Denies any ingestions or any other medical complaints. No evidence of alcohol withdrawal symptoms. Presentation not consistent with overt toxidrome, ingestion given history & physical. Presentation not consistent with organic or medical emergency at this time.  Patient meets criteria for psychiatric consultation given persistence seizure-like activity Dispo: Care of this patient will be signed out to the oncoming physician at the end of my shift.  All pertinent patient information conveyed and all questions answered.  All further care and disposition decisions will be made by the oncoming physician.   FINAL CLINICAL IMPRESSION(S) / ED DIAGNOSES   Final diagnoses:  Psychogenic nonepileptic seizure  Altered mental status, unspecified altered mental status type  Anxiety   Rx / DC Orders   ED Discharge Orders     None      Note:  This document was prepared using Dragon voice recognition software and may include unintentional dictation errors.   Merwyn Katos, MD 01/26/24 [phone]

## 2024-01-25 NOTE — ED Notes (Signed)
Vol /psych consult ordered/ pending  

## 2024-01-25 NOTE — ED Notes (Signed)
 This tech gave pt a snack and beverage.

## 2024-01-25 NOTE — ED Notes (Signed)
 Patient belongings: -black tennis shoes  -red socks -camo pants -red shirt -pink bra -gray coat  Patient allowed to keep camboot on R foot. Patient also has electrode cords taped to R shoulder from previous procedure.

## 2024-01-25 NOTE — ED Triage Notes (Addendum)
 Patient to ED via [facility]. Patient was walking dog in front of car lot when a bystander called EMS stating patient had "seizure-like" activity. EMS reports patient was acting boisterous in route, jumping up and down on stretcher and trying to bite staff. Patient received 2.5 versed and 4mg  zofran. Patient has Hx of seizures and breast cancer.  Patient currently A&Ox1 during this RN's assessment.

## 2024-01-26 DIAGNOSIS — R29818 Other symptoms and signs involving the nervous system: Secondary | ICD-10-CM | POA: Diagnosis not present

## 2024-01-26 DIAGNOSIS — F39 Unspecified mood [affective] disorder: Secondary | ICD-10-CM

## 2024-01-26 DIAGNOSIS — F444 Conversion disorder with motor symptom or deficit: Secondary | ICD-10-CM

## 2024-01-26 DIAGNOSIS — F445 Conversion disorder with seizures or convulsions: Secondary | ICD-10-CM | POA: Diagnosis not present
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
## 2024-01-26 NOTE — Discharge Instructions (Addendum)
 Food Resources  Agency Name: [facility] Agency Address: [address] [place], [place] [address] Phone: [phone] Website: www.[email or web address].org Service(s) Offered: Housing services, self-sufficiency, congregate meal program, weatherization program, Event organiser program, emergency food assistance,  housing counseling, home ownership program, wheels - to work program.  Dole Food free for 60 and older at various locations from [employer], Monday-Friday:  [employer], [address] [place], [phone] -[facility], [address] [place] [phone]  -[facility], [address], [place] [phone]  -[address], [place], [phone]  Agency Name: [facility] Address: [phone] [address], [place], [place] [address] Phone: [phone] Website: www.[email or web address] Service(s) Offered: Home delivered hot, frozen, and emergency  meals. Grocery assistance program which matches  volunteers one-on-one with seniors unable to grocery shop  for themselves. Must be 60 years and older; less than 20  hours of in-home aide service, limited or no driving ability;  live alone or with someone with a disability; live in  [place].  Agency Name: [facility]) Address: [address], [place], [place] [address] Phone: [phone] Service(s) Offered: Food is served to shut-ins, homeless, elderly, and low income people in the community every Saturday (11:30 am-12:30 pm) and Sunday (12:30 pm-1:30pm). Volunteers also offer help and encouragement in seeking employment,  and spiritual guidance.  Agency Name: Department of Social Services Address: [address] [place], [place] [address] Phone: [phone] Service(s) Offered: Child support services; child welfare services; food stamps; Medicaid; work first family assistance; and aid  with fuel,  rent, food and medicine.  Agency Name: Dietitian Address: [address], [place], [place] Phone: [phone] Website: www.[email or web address] Services Offered: Monday 10:00am-12:00, 8:00pm-9:00pm, and Friday 10:00am-12:00.  Agency Name: [employer] [facility] [place] Address: [address], [place], [place] [address] Phone: [phone] Website: www.alliedchurches.org Service(s) Offered: Serves weekday meals, open from 11:30 am- 1:00 pm., and 6:30-7:30pm, Monday-Wednesday-Friday distributes food 3:30-6pm, Monday-Wednesday-Friday.  Agency Name: [facility] Address: [address] [place], [place] Phone: [phone] Website: [email or web address].[email or web address].[email or web address] Services Offered: Distributes food the 4th Saturday of the month, starting at 8:00 am  Agency Name: [facility] Address: [phone] [address] [place], [place] [address] Phone: [phone] Website: [email or web address] Service(s) Offered: Bread of life, weekly food pantry. Open Wednesdays from 10:00am-noon.  Agency Name: [facility] [employer] Address: [address] [place], [place], [place] Phone: [phone] Services Offered: Distributes food 9am-1pm, Monday-Thursday. Call for details.  Agency Name: [facility] Address: [address], [place], [place] [address] Phone: [phone] Website: firstbaptistburlington.[email or web address] Service(s) Offered: Games developer. Call for assistance.  Agency Name: [facility] Address: [address] [place], [place] [address] Phone: [phone] Service Offered: Emergency Food Pantry. Call for appointment.  Agency Name: [facility] Address: [address] [place], [place] [address] Phone: [phone] Website: [email or web address] Services Offered: Games developer. Call for details  Agency Name: [facility] Address: [address] [place], [place] Phone:  [phone] Website: [email or web address] Service(s) Offered: Emergency Food Pantry. Call for details.  Agency Name: Holiday representative Address: [address] [place], [place] [address] Phone: [phone] or [phone] Website: [email or web address] Service(s) Offered: Distribute food 9am-11:30 am, Tuesday-Friday, and 1-3:30pm, Monday-Friday. Food pantry Monday-Friday 1pm-3pm, fresh items, Mon.-Wed.-Fri.  Agency Name: [facility] Address: [address] [place], [place] [address] Phone: [phone] Website: www.safealamance.org Services Offered: Distribute food Tues and Sats from 9:00am-noon.  Closed 1st Saturday of each month. Call for details  Agency Name: [facility] [employer] Address: [facility] [address], [place] [address] Phone: [phone]  Services Offered: Delivers meals every Thursday   Rent/Utility/Housing  Agency Name: [facility] Agency Address: [address] [place], [place] [address] Phone: [phone] Email: [email or web address] Website: www.[email or web address].org Service(s) Offered: Housing services, self-sufficiency, congregate meal program, weatherization program, Field seismologist program, emergency food assistance,  housing counseling, home ownership program, wheels -towork program.  Agency Name: [facility] Address: [address] [place], [place] [address] Phone: [phone] (8a-4p) [phone] (8p- 10p) Email: [email or web address] Website: [email or web address] Service(s) Offered: A program for homeless and/or needy men that includes one-on-one counseling, life skills training and job rehabilitation.  Agency Name: [employer] [facility] [place] Address: [address] [place], [place] [address] Phone: [phone] Website: www.alliedchurches.org Service(s) Offered: Assistance to needy in emergency with  utility bills, heating fuel, and prescriptions. Shelter for homeless 7pm-7am. March 12, 2017 15  Agency Name: [facility] [place] (Developmentally Disabled) Address: [address], [place], [place] [address] Phone: [phone] Contact Person: Cathleen Corti Email: [email or web address] Website: [email or web address] Service(s) Offered: Helps individuals with developmental disabilities move from housing that is more restrictive to homes where they  can achieve greater independence and have more  opportunities.  Agency Name: [facility] Address: [address], [place], [place] [address] Phone: [phone] Email: [email or web address] Website: www.[email or web address].org Service(s) Offered: Provides affordable housing for low-income families, elderly, and disabled individuals. Offer a wide range of  programs and services, from financial planning to afterschool and summer programs.  Agency Name: Department of Social Services Address: [address] [place], [place] [address] Phone: [phone] Service(s) Offered: Child support services; child welfare services; food stamps; Medicaid; work first family assistance; and aid with fuel,  rent, food and medicine.  Agency Name: [facility] [place] [employer]. Address: [facility] [address], [place], [place]  [address] Phone: [phone] Website: www.familyabuseservices.org Service(s) Offered: 24 hour Crisis Line: [phone]; 24 hour Emergency Shelter; Transitional Housing; Support Groups; Scientist, physiological; [employer]; Hispanic Outreach: [phone];  Visitation Center: [phone].  Agency Name: [facility] [place]. Address: [address] [place], [place] [address] Phone: [phone] Service(s) Offered: CAP Services; Home and [employer]; Individual or Group Supports; Respite Care Non-Institutional Nursing;  Residential Supports; Respite Care and Personal Care  Services; Transportation; Family and Friends Night; Recreational Activities; Three Nutritious Meals/Snacks; Consultation with Registered Dietician; Twenty-four hour Registered Nurse Access; Daily and [employer] and [employer]s; Camp Green Leaves; [place] for the [employer] (During Summer Months) Bingo Night (Every  Wednesday Night); Special Populations Dance Night  (Every Tuesday Night); Professional Hair Care Services.  Agency Name: [facility] Address: [address], [place], [place] [address] Phone: [phone] Contact Person: Jabier Mutton Website: [email or web address].Physicist, medical) Offered: Residential treatment facility for women; food and  clothing, educational & employment development and  transportation to work; Counsellor of financial skills;  parenting and family reunification; emotional and spiritual  support; transitional housing for program graduates.  Agency Name: [facility] Address: [address], [place], [place] [address] Phone: [phone] Email: [email or web address] Website: TaskTown.es Service(s) Offered: Public housing units for elderly, disabled, and low income people; housing choice vouchers for income eligible  applicants; shelter plus care vouchers; and Psychologist, clinical.  Agency Name: [facility] [employer] Address: [address], [place], [place] [address] Phone: [phone] Email: [email or web address] Website: www.habitatalamance.org  Service(s) Offered: Build houses for families in need of decent housing. Each adult in the family must invest 200 hours of labor on  someone else's house, work with volunteers to build their own house, attend classes on budgeting, home maintenance, yard care, and attend homeowner association meetings.  Agency Name: [facility] Address: [address], [place], [place] [address] Phone: [phone] Website:  www.rsli.org Service(s) Offered: Intermediate care facilities for intellectually delayed, Supervised Living in group homes for adults with developmental disabilities, Supervised Living for people who have dual diagnoses (MRMI), Independent Living, Supported Living, respite and a variety of CAP services, pre-vocational services, day supports, and [employer].  Agency Name: [place] [facility] Phone: [phone] Website: www.NCForeclosurePrevention.gov Service(s) Offered: Zero-interest, deferred loans to homeowners struggling to pay their mortgage. Call for more information.   Transportation Resources  Agency Name: [facility] Agency Address: [address] [place], [place] [address] Phone: [phone] Email: [email or web address] Website: www.alamanceservices.org Service(s) Offered: Housing services, self-sufficiency, congregate meal program, weatherization program, Field seismologist program, emergency food assistance,  housing counseling, home ownership program, wheels-towork program.  Agency Name: [facility] [employer] [phone]) Address: [address] [place], [place] [address] Phone: [phone] Website: www.acta-[place].com Service(s) Offered: Transportation for [employer], subscription and demand response; Dial-a-Ride for citizens 8 years of age or older.  Agency Name: Department of Social Services Address: [address] [place], [place] [address] Phone: [phone] Service(s) Offered: Child support services; child welfare services; food stamps; Medicaid; work first family assistance; and aid with fuel,  rent, food and medicine, transportation assistance.  Agency Name: Disabled [employer] (DAV) Transportation  Network Phone: [phone] Service(s) Offered: Transports veterans to the [facility]. Call  forty-eight hours in advance and leave the name,  telephone  number, date, and time of appointment. Veteran will be  contacted by the driver the day before the appointment to  arrange a pick up point   Transportation Resources  Agency Name: [facility] Agency Address: [address] [place], [place] [address] Phone: [phone] Email: [email or web address] Website: www.[email or web address].org Service(s) Offered: Housing services, self-sufficiency, congregate meal program, weatherization program, Field seismologist program, emergency food assistance,  housing counseling, home ownership program, wheels-towork program.  Agency Name: [facility] [phone]) Address: [address] [place], [place] [address] Phone: [phone] Website: www.acta-[email or web address] Service(s) Offered: Transportation for [employer], subscription and demand response; Dial-a-Ride for citizens 67 years of age or older.  Agency Name: Department of Social Services Address: [address] [place], [place] [address] Phone: [phone] Service(s) Offered: Child support services; child welfare services; food stamps; Medicaid; work first family assistance; and aid with fuel,  rent, food and medicine, transportation assistance.  Agency Name: Disabled [employer] (DAV) Transportation  Network Phone: [phone] Service(s) Offered: Transports veterans to the [facility] medical center. Call  forty-eight hours in advance and leave the name, telephone  number, date, and time of appointment. Veteran will be  contacted by the driver the day before the appointment to  arrange a pick up point    [employer] ACTA currently provides door to door services. ACTA connects with PART daily for services to [facility]. ACTA also performs contract services to [employer] operates 27 vehicles, all but 3 mini-vans are equipped with lifts for special needs as well as the  general public. ACTA drivers are each CDL certified and trained in First Aid and CPR. ACTA was established in 2002 by [employer]  county Building services engineer. An independent Industrial/product designer. ACTA operates via Cytogeneticist with required Research scientist (physical sciences) from [place]. ACTA provides over 80,000 passenger trips each year, including [facility] Adult Day Services and [employer] sites.  Call at least by 11 AM one business day prior to needing transportation  [employer].                      [place], [place] [address]     Office Hours: Monday-Friday  8 AM - 5 PM    the Institute on Aging offers a [employer] that anyone can call toll free at [phone]. The friendship line is available 24 hours a day  [employer] is a Program of All-inclusive Care for the Elderly (PACE). Their mission is to promote and sustain the independence of seniors wishing to remain in the community. They provide seniors with comprehensive long-term health, social, medical and dietary care. Their program is a safe alternative to nursing home care. [phone]  [facility] Physical Address [facility] [address] [place], [place] [address] Phone: [phone]. . Online zoom yoga class, connect with others without leaving your home [facility] offers [place] dance cardio sessions for individuals via Zoom. This program provides: - Dance fitness activities Please contact program for more information. Servinganyone in need adults 18+ hiv/aids individuals families Call [phone]  Email [email or web address] [email or web address] to get more info  Humana offers an online Toll Brothers to individuals where they can receive help to focus on their best health. Whether you're a Humana member or not, the neighborhood center offers a... Main Serviceshealth education  exercise & fitness  community support services   recreation  virtual support Other Servicessupport groups Servinganyone in need adults young adults teens seniors individuals families [email or web address] to get more info  Schedule on their website  The [facility] offers an array of activities for adults age 50 and over. This program provides:- Fitness and health programs- Tech classes- Activity books Main Serviceshealth education  community support services  exercise & fitness  recreation  more education Servingseniors  Call [phone]    For more resources go online to RhodeIslandBargains.co.uk and type in you zipcode   Some PCP options in [place] area- not a comprehensive list  [facility]- [phone] [facility]- [phone] [facility]- [phone] [facility]- [phone] [facility]- [phone] Lutricia Horsfall- [phone]  or [facility] Physician Referral Line [phone]

## 2024-01-26 NOTE — ED Notes (Signed)
 vol/consult done/psych cleared/disposition pending.

## 2024-01-26 NOTE — ED Provider Notes (Signed)
 Emergency Medicine Observation Re-evaluation Note  Chelsea Stanley is a 58 y.o. female, seen on rounds today.  Pt initially presented to the ED for complaints of Seizures  Currently, the patient is is no acute distress. Denies any concerns at this time.  Physical Exam  Blood pressure (!) 100/53, pulse 66, temperature 97.9 F (36.6 C), temperature source Oral, resp. rate 18, height 5\' 5"  (1.651 m), weight 70 kg, SpO2 92%.  Physical Exam: General: No apparent distress Pulm: Normal WOB Neuro: Moving all extremities Psych: Resting comfortably     ED Course / MDM     I have reviewed the labs performed to date as well as medications administered while in observation.  Recent changes in the last 24 hours include: No acute events overnight.  Plan   Current plan: Patient awaiting psychiatric disposition. Patient is not under full IVC at this time.  5:00 AM  Pt psych cleared by Dr. Elesa Hacker.  He recommends TOC consult but likely dc with boyfriend later today,   Keasia Dubose, Layla Maw, DO 01/26/24 0500

## 2024-01-26 NOTE — ED Notes (Signed)
PT VOL/PENDING TOC PLACEMENT.

## 2024-01-26 NOTE — ED Notes (Signed)
 Pt refused vital signs.

## 2024-01-26 NOTE — TOC Initial Note (Signed)
 Transition of Care [facility]) - Initial/Assessment Note    Patient Details  Name: Chelsea Stanley MRN: [id number] Date of Birth: May 18, 1966  [facility]) CM/SW Contact:    Colin Broach, LCSW Phone Number: 01/26/2024, 3:42 PM  Clinical Narrative:                 CSW met with patient and introduced self and reason for visit.  Patient amenable to visit.  Patient reports that she is working on getting a new PCP.  She reports that she has no concerns with getting her meds and [facility]  Pt lives with her Hali Marry [phone]).  She states that she does have crutches at home, but she has a hard time using them.  Patient reports that she's not sure how she'll get home, but that, if she needs to pay for a taxi she is able to do so.  CSW provided patient with hard copies of resources for shelters and counseling services.  Other resources were put into AVS to address SDOH consults.  CM to continue to follow for any discharge needs that may arise.  Expected Discharge Plan: Home/Self Care Barriers to Discharge: Continued Medical Work up   Patient Goals and CMS Choice            Expected Discharge Plan and Services       Living arrangements for the past 2 months: Hotel/Motel                                      Prior Living Arrangements/Services Living arrangements for the past 2 months: Hotel/Motel Lives with:: Significant Other Patient language and need for interpreter reviewed:: Yes Do you feel safe going back to the place where you live?: Yes            Criminal Activity/Legal Involvement Pertinent to Current Situation/Hospitalization: No - Comment as needed  Activities of Daily Living      Permission Sought/Granted                  Emotional Assessment Appearance:: Appears stated age Attitude/Demeanor/Rapport: Engaged Affect (typically observed): Appropriate Orientation: : Oriented to Self, Oriented to Place,  Oriented to  Time, Oriented to Situation Alcohol / Substance Use: Not Applicable Psych Involvement: No (comment)  Admission diagnosis:  seizure; syncope ems Patient Active Problem List   Diagnosis Date Noted   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 [place] [address] Phone: [phone] Fax: [phone]     Social Drivers of Health (SDOH) Social History: SDOH Screenings   Food Insecurity: Food Insecurity Present (06/15/2023)   Received from [employer]  Housing: Low Risk  (12/29/2022)  Transportation Needs: Unmet Transportation Needs (06/15/2023)   Received from [facility]  Utilities: Not At Risk (06/15/2023)   Received from [facility]  Alcohol Screen: Low Risk  (12/29/2022)  Financial Resource Strain: High Risk (06/15/2023)   Received from [facility]  Physical Activity: Unknown (06/15/2023)   Received from [facility]  Social Connections: Socially Isolated (06/15/2023)   [facility] from [facility]  Stress: Stress Concern Present (06/15/2023)   Received from [facility]  Tobacco Use: Low Risk  (12/29/2022)   SDOH Interventions:     Readmission Risk Interventions     No data to display

## 2024-01-26 NOTE — ED Notes (Addendum)
 Update:  TOC confirmed consult was completed and currently awaiting discharge orders.  Chelsea Stanley, [facility]

## 2024-01-27 DIAGNOSIS — F445 Conversion disorder with seizures or convulsions: Secondary | ICD-10-CM | POA: Diagnosis not present

## 2024-01-27 MED ORDER — SODIUM CHLORIDE 0.9 % IV BOLUS
1000.0000 mL | Freq: Once | INTRAVENOUS | Status: AC
  Administered 2024-01-27: 1000 mL via INTRAVENOUS

## 2024-01-27 NOTE — ED Notes (Signed)
 Ambulates safely with walker. Discharged to boyfriend, Cristal Deer. Pt provided walker.

## 2024-01-27 NOTE — ED Provider Notes (Signed)
 Procedures     ----------------------------------------- 11:46 AM on 01/27/2024 ----------------------------------------- Pt now wishes to be discharged. She is medically and psych stable. Has a friend who can come pick her up.     Sharman Cheek, MD 01/27/24 1147

## 2024-01-27 NOTE — ED Notes (Signed)
 Pt expressing desire to leave. PT ambulating well with walker. Boyfriend, Cristal Deer coming to pick up patient. All pt's belongings returned.

## 2024-01-27 NOTE — ED Provider Notes (Signed)
 Emergency Medicine Observation Re-evaluation Note  Chelsea Stanley is a 58 y.o. female, seen on rounds today.  Pt initially presented to the ED for complaints of Seizures Currently, the patient is resting comfortably.  Physical Exam  BP (!) 82/50   Pulse 69   Temp 98.7 F (37.1 C) (Oral)   Resp 16   Ht 5\' 5"  (1.651 m)   Wt 70 kg   SpO2 94%   BMI 25.69 kg/m  Physical Exam General: No acute distress  ED Course / MDM  EKG:EKG Interpretation Date/Time:  Monday January 25 2024 14:24:15 EDT Ventricular Rate:  75 PR Interval:  168 QRS Duration:  98 QT Interval:  383 QTC Calculation: 428 R Axis:   -51  Text Interpretation: Sinus rhythm Probable left atrial enlargement LAD, consider left anterior fascicular block Nonspecific T abnormalities, lateral leads Baseline wander in lead(s) II III aVF V3 V5 Confirmed by UNCONFIRMED, DOCTOR ([id number]), editor Lonell Face [phone]) on 01/25/2024 2:38:22 PM  I have reviewed the labs performed to date as well as medications administered while in observation.    Notified by nursing of low blood pressure at 85/53.  This was incidental finding and patient is completely asymptomatic.  Initially tried oral rehydration but recheck blood pressure remained low.  Patient still denies any symptoms and does not feel lightheaded.  Will place IV and get 1 L fluids.  Reviewed labs from admission which were reassuring.  Plan  Current plan is for TOC.    Janith Lima, MD 01/27/24 [phone]

## 2024-01-27 NOTE — ED Notes (Signed)
 Pt hypotensive at 85/53 with MAP of 64. Pt alert and not symptomatic.

## 2024-01-27 NOTE — ED Notes (Signed)
 MD notified of low BP. Encouraged fluids. Pt drinking fluids at this time.

## 2024-01-27 NOTE — ED Notes (Signed)
 Breakfast provided.

## 2024-02-11 ENCOUNTER — Encounter: Payer: Self-pay | Admitting: Family Medicine

## 2024-02-11 ENCOUNTER — Ambulatory Visit: Payer: 59 | Admitting: Family Medicine

## 2024-02-11 VITALS — BP 91/70 | HR 90 | Ht 60.0 in | Wt 140.0 lb

## 2024-02-11 DIAGNOSIS — M79601 Pain in right arm: Secondary | ICD-10-CM

## 2024-02-11 DIAGNOSIS — F332 Major depressive disorder, recurrent severe without psychotic features: Secondary | ICD-10-CM | POA: Diagnosis not present

## 2024-02-11 DIAGNOSIS — Z1231 Encounter for screening mammogram for malignant neoplasm of breast: Secondary | ICD-10-CM

## 2024-02-11 DIAGNOSIS — F410 Panic disorder [episodic paroxysmal anxiety] without agoraphobia: Secondary | ICD-10-CM | POA: Insufficient documentation

## 2024-02-11 DIAGNOSIS — I5181 Takotsubo syndrome: Secondary | ICD-10-CM

## 2024-02-11 DIAGNOSIS — Z7689 Persons encountering health services in other specified circumstances: Secondary | ICD-10-CM | POA: Insufficient documentation

## 2024-02-11 DIAGNOSIS — F39 Unspecified mood [affective] disorder: Secondary | ICD-10-CM | POA: Diagnosis not present

## 2024-02-11 MED ORDER — EPINEPHRINE 0.3 MG/0.3ML IJ SOAJ: 0.3000 mg | INTRAMUSCULAR | 1 refills | Status: AC | PRN

## 2024-02-11 MED ORDER — GABAPENTIN 300 MG PO CAPS: 300.0000 mg | ORAL_CAPSULE | Freq: Three times a day (TID) | ORAL | 1 refills | Status: DC

## 2024-02-11 NOTE — Assessment & Plan Note (Addendum)
 Trazodone, Sertraline, quetiapine and clonazepam all refilled by neurology at visit 11/24/2023.  Patient endorses adequate supply.  Encouraged her to ensure she has refills available, as I cannot see this in the chart. - Referred patient to psychiatry

## 2024-02-11 NOTE — Assessment & Plan Note (Signed)
 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 Prefers in-person therapy. - Send referral to psychiatry for evaluation and management. - Arrange for therapy and counseling services.

## 2024-02-11 NOTE — Assessment & Plan Note (Signed)
 Patient reports increased symptoms at this time.  Trazodone, Sertraline, quetiapine and clonazepam all refilled by neurology at visit 11/24/2023.  Patient endorses adequate supply.  Encouraged her to ensure she has refills available, as I cannot see this in the chart. - Referred patient to psychiatry

## 2024-02-11 NOTE — Assessment & Plan Note (Signed)
 Chronic pain affecting hand movements and strength. Managed with tramadol and gabapentin. - Continue gabapentin for pain management. - Ensure follow-up with pain management specialist.

## 2024-02-11 NOTE — Assessment & Plan Note (Signed)
 From record cardiology note 05/2018 - "TTE on 2/21 with severely decreased LV systolic function, EF 10%,relatively preserved basal function suggestive of stress cardiomyopathy (Takatsubo). Prior echo in 2016 with normal EF. Chelsea Stanley is a diagnosis of exclusion so LHC was performed on 01/11/18 in order to rule out ischemic etiology and revealed no significant coronary artery disease, only spasm from introduction of the catheter." Completely resolved per the note.
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
## 2024-02-11 NOTE — Patient Instructions (Addendum)
 Please call the [facility] [phone]) to schedule a routine screening mammogram.   Recommended vaccines: Tdap (tetanus) and Shingrix (shingles)   Follow up with [facility]  Follow up with the [facility]

## 2024-04-08 ENCOUNTER — Ambulatory Visit: Admitting: Family Medicine

## 2024-04-20 ENCOUNTER — Emergency Department
Admission: EM | Admit: 2024-04-20 | Discharge: 2024-04-20 | Disposition: A | Discharge status: Home / Self Care | Attending: Emergency Medicine | Admitting: Emergency Medicine

## 2024-04-20 ENCOUNTER — Encounter: Payer: Self-pay | Admitting: Emergency Medicine

## 2024-04-20 ENCOUNTER — Other Ambulatory Visit: Payer: Self-pay

## 2024-04-20 DIAGNOSIS — R110 Nausea: Secondary | ICD-10-CM | POA: Insufficient documentation

## 2024-04-20 DIAGNOSIS — F419 Anxiety disorder, unspecified: Secondary | ICD-10-CM | POA: Insufficient documentation

## 2024-04-20 DIAGNOSIS — Y9301 Activity, walking, marching and hiking: Secondary | ICD-10-CM | POA: Insufficient documentation

## 2024-04-20 DIAGNOSIS — M546 Pain in thoracic spine: Secondary | ICD-10-CM | POA: Insufficient documentation

## 2024-04-20 DIAGNOSIS — F329 Major depressive disorder, single episode, unspecified: Secondary | ICD-10-CM | POA: Insufficient documentation

## 2024-04-20 DIAGNOSIS — W01198A Fall on same level from slipping, tripping and stumbling with subsequent striking against other object, initial encounter: Secondary | ICD-10-CM | POA: Diagnosis not present

## 2024-04-20 DIAGNOSIS — R519 Headache, unspecified: Secondary | ICD-10-CM | POA: Insufficient documentation

## 2024-04-20 DIAGNOSIS — G40909 Epilepsy, unspecified, not intractable, without status epilepticus: Secondary | ICD-10-CM | POA: Diagnosis not present

## 2024-04-20 DIAGNOSIS — M25511 Pain in right shoulder: Secondary | ICD-10-CM | POA: Insufficient documentation

## 2024-04-20 DIAGNOSIS — W19XXXA Unspecified fall, initial encounter: Secondary | ICD-10-CM

## 2024-04-20 LAB — CBC
HCT: 42.2 % (ref 36.0–46.0)
Hemoglobin: 13.6 g/dL (ref 12.0–15.0)
MCH: 28.2 pg (ref 26.0–34.0)
MCHC: 32.2 g/dL (ref 30.0–36.0)
MCV: 87.4 fL (ref 80.0–100.0)
Platelets: 274 10*3/uL (ref 150–400)
RBC: 4.83 MIL/uL (ref 3.87–5.11)
RDW: 13.9 % (ref 11.5–15.5)
WBC: 6.2 10*3/uL (ref 4.0–10.5)
nRBC: 0 % (ref 0.0–0.2)

## 2024-04-20 LAB — COMPREHENSIVE METABOLIC PANEL WITH GFR
ALT: 11 U/L (ref 0–44)
AST: 22 U/L (ref 15–41)
Albumin: 3.9 g/dL (ref 3.5–5.0)
Alkaline Phosphatase: 86 U/L (ref 38–126)
Anion gap: 9 (ref 5–15)
BUN: 9 mg/dL (ref 6–20)
CO2: 23 mmol/L (ref 22–32)
Calcium: 9.1 mg/dL (ref 8.9–10.3)
Chloride: 105 mmol/L (ref 98–111)
Creatinine, Ser: 0.86 mg/dL (ref 0.44–1.00)
GFR, Estimated: >60 mL/min (ref >60)
Glucose, Bld: 112 mg/dL — ABNORMAL HIGH (ref 70–99)
Potassium: 3.5 mmol/L (ref 3.5–5.1)
Sodium: 137 mmol/L (ref 135–145)
Total Bilirubin: 0.9 mg/dL (ref 0.0–1.2)
Total Protein: 7.1 g/dL (ref 6.5–8.1)

## 2024-04-20 LAB — ETHANOL: Alcohol, Ethyl (B): <15 mg/dL (ref <15)

## 2024-04-20 MED ORDER — OXYCODONE-ACETAMINOPHEN 5-325 MG PO TABS
1.0000 | ORAL_TABLET | Freq: Once | ORAL | Status: AC
  Administered 2024-04-20: 1 via ORAL
  Filled 2024-04-20: qty 1

## 2024-04-20 MED ORDER — ONDANSETRON 4 MG PO TBDP
4.0000 mg | ORAL_TABLET | Freq: Once | ORAL | Status: AC
  Administered 2024-04-20: 4 mg via ORAL
  Filled 2024-04-20: qty 1
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
## 2024-04-20 NOTE — ED Notes (Signed)
 Pt now stating "I don't remember", "I want to go home" and "I am not crazy." Pt repeating "home." Pt refusing to be dressed out at this time.

## 2024-04-20 NOTE — Discharge Instructions (Signed)
 Return to the ER for new, worsening, or persistent severe head, shoulder, arm, leg pain, weakness or numbness, seizures, confusion, acute anxiety, any thoughts of harming yourself or others, or any other new or worsening symptoms that concern you.  Follow-up with your primary care provider.

## 2024-04-20 NOTE — ED Notes (Addendum)
 Patient standing in the hallway shouting at staff, and others.

## 2024-04-20 NOTE — ED Provider Notes (Signed)
 [facility] Provider Note    Event Date/Time   First MD Initiated Contact with Patient 04/20/24 1146     (approximate)   History   Psychiatric Evaluation   HPI  Chelsea Stanley is a 58 y.o. female with history of anxiety, seizure disorder, major depressive disorder, and CVA who presents with a behavior concern after a fall.  Per triage, EMS was initially called out for possible seizures.  The patient was found to be hitting herself in the head and throwing herself on the ground, calling out "mommy" and "where, where."  The patient was somewhat uncooperative in triage, raising concern for an acute mental health issue.  On my evaluation, the patient is alert and oriented.  She appears anxious.  She denies SI or HI.  She denies having any acute mental health crisis.  The patient states that she was walking her dog, when it saw another dog and pulled her.  This caused her to fall.  She thinks that she may have had a seizure subsequently.  She states it has been sometime since she had her most recent seizure.  She states she is now feeling anxious and is somewhat upset that she is here and being evaluated for mental health.  She also reports headache, nausea, and right-sided pain from the fall.  I reviewed the past medical records.  The patient's most recent outpatient counter was with her primary care provider on 3/27 for follow-up of her chronic conditions.  She is on trazodone , sertraline , quetiapine , and clonazepam  for major depressive disorder.  Per that note, her seizure disorder is thought to be psychogenic and nonepileptic.   Physical Exam   Triage Vital Signs: ED Triage Vitals [04/20/24 1114]  Encounter Vitals Group     BP (!) 106/93     Systolic BP Percentile      Diastolic BP Percentile      Pulse Rate 89     Resp 17     Temp 97.8 F (36.6 C)     Temp Source Oral     SpO2 96 %     Weight 138 lb 14.2 oz (63 kg)     Height 5' (1.524 m)     Head  Circumference      Peak Flow      Pain Score      Pain Loc      Pain Education      Exclude from Growth Chart     Most recent vital signs: Vitals:   04/20/24 1114 04/20/24 1319  BP: (!) 106/93 108/74  Pulse: 89 65  Resp: 17 16  Temp: 97.8 F (36.6 C) 97.9 F (36.6 C)  SpO2: 96% 96%     General: Alert and oriented, anxious appearing, no distress.  CV:  Good peripheral perfusion.  Resp:  Normal effort.  Abd:  No distention.  Other:  EOMI.  PERRLA.  No photophobia.  No facial droop.  Normal speech.  Motor intact in all extremities.  Normal gait.  Right trapezius and paraspinal thoracic tenderness.  No midline spinal tenderness.   ED Results / Procedures / Treatments   Labs (all labs ordered are listed, but only abnormal results are displayed) Labs Reviewed  COMPREHENSIVE METABOLIC PANEL WITH GFR - Abnormal; Notable for the following components:      Result Value   Glucose, Bld 112 (*)    All other components within normal limits  ETHANOL  CBC  URINE DRUG SCREEN, QUALITATIVE ([facility] ONLY)  POC URINE PREG, ED     EKG    RADIOLOGY    PROCEDURES:  Critical Care performed: No  Procedures   MEDICATIONS ORDERED IN ED: Medications  oxyCODONE -acetaminophen  (PERCOCET/ROXICET) 5-325 MG per tablet 1 tablet (1 tablet Oral Given 04/20/24 1233)  ondansetron  (ZOFRAN -ODT) disintegrating tablet 4 mg (4 mg Oral Given 04/20/24 1233)     IMPRESSION / MDM / ASSESSMENT AND PLAN / ED COURSE  I reviewed the triage vital signs and the nursing notes.  58 year old female with PMH as noted above presents after EMS noted bizarre and concerning behavior when the patient had an apparent seizure.  The patient reports falling due to being pulled by her dog.  She denies any acute mental health complaints.  When I first evaluated the patient, she was refusing to go into a room and was standing in the hallway, with security around her.  The patient was very upset that she heard a security  guard referred to her as a "mental" patient.  I attempted to de-escalate the situation.  On this initial discussion it was evident that the patient was alert and oriented, and was answering questions appropriately.  She denied SI or HI, was not having any hallucinations or delusional or disorganized thought, and was not responding to internal stimuli.  She appeared anxious but did not demonstrate any acute danger to self or others or any signs of an active mental health crisis or altered mental status.    I performed an initial neurologic exam with her standing in the hallway, as per her preference, and then she started having some increased pain and photophobia so agreed to go to an exam room once I reassured her I was not going to IVC her or do anything against her will.  Subsequently I did a thorough neurologic exam which was nonfocal.  She has some mild paraspinal tenderness to her right thoracic back but no other evidence of trauma.  She reports this is an exacerbation of chronic pain.  There is no indication for imaging.  Differential diagnosis includes, but is not limited to, acute anxiety/panic, syncope, seizure, nonepileptic seizure.  At this time the patient is alert and oriented, answering questions appropriately, denies SI or HI, denies hallucinations, and does not appear to be responding to internal stimuli.  Patient's presentation is most consistent with acute presentation with potential threat to life or bodily function.  ----------------------------------------- 1:14 PM on 04/20/2024 -----------------------------------------  The patient is feeling much better after the pain medication and Zofran .  She is stable for discharge home at this time.  She continues to be behaving appropriately, appears much less anxious, and does not demonstrate any signs of danger to self or others.  I gave her strict return precautions, and she expresses understanding.  FINAL CLINICAL IMPRESSION(S) / ED  DIAGNOSES   Final diagnoses:  Fall, initial encounter     Rx / DC Orders   ED Discharge Orders     None        Note:  This document was prepared using Dragon voice recognition software and may include unintentional dictation errors.    Lind Repine, MD 04/20/24 1335

## 2024-08-04 ENCOUNTER — Ambulatory Visit: Payer: Self-pay

## 2024-08-04 DIAGNOSIS — F444 Conversion disorder with motor symptom or deficit: Secondary | ICD-10-CM

## 2024-08-04 DIAGNOSIS — F445 Conversion disorder with seizures or convulsions: Secondary | ICD-10-CM

## 2024-08-04 DIAGNOSIS — G43109 Migraine with aura, not intractable, without status migrainosus: Secondary | ICD-10-CM

## 2024-08-04 NOTE — Telephone Encounter (Signed)
 FYI Only or Action Required?: FYI only for provider.  Patient was last seen in primary care on 02/11/2024 by Donzella Lauraine SAILOR, DO.  Called Nurse Triage reporting Depression.  Symptoms began Ongoing.  Interventions attempted: Rest, hydration, or home remedies.  Symptoms are: gradually worsening.  Triage Disposition: See Physician Within 24 Hours  Patient/caregiver understands and will follow disposition?: Yes Reason for Disposition  [1] Depression AND [2] getting worse (e.g., sleeping poorly, less able to do activities of daily living)  Answer Assessment - Initial Assessment Questions [place], boyfriend on the line to help as patient is having trouble speaking, patient is in the background really worked up stating she needs help. Has been off medications for over a year, trying to self regulate and having a really hard time. Neurologist was prescribing medications, have not seen him in almost 2 years and is requiring an appointment in order to get more medication, patient is unable to get to [facility] for appointments. Patient already scheduled with PCP on 10/6 for referral. Patient given [facility] information and advised patient to go there, no appointment necessary.  1. CONCERN: What happened that made you call today?     A phone call from an unknown psychiatrist in [place] got her really worked up. [place] is too far for patient to get to but she really needs to get a referral to someone closer.  2. DEPRESSION SYMPTOM SCREENING: How are you feeling overall? (e.g., decreased energy, increased sleeping or difficulty sleeping, difficulty concentrating, feelings of sadness, guilt, hopelessness, or worthlessness)     High anxiety, and depressed, agoraphobia.  3. RISK OF HARM - SUICIDAL IDEATION:  Do you ever have thoughts of hurting or killing yourself?  (e.g., yes, no, no but preoccupation with thoughts about death)     No not currently, but sometimes has that  feeling, no plan.  4. RISK OF HARM - HOMICIDAL IDEATION:  Do you ever have thoughts of hurting or killing someone else?  (e.g., yes, no, no but preoccupation with thoughts about death)     No  5. FUNCTIONAL IMPAIRMENT: How have things been going for you overall? Have you had more difficulty than usual doing your normal daily activities?  (e.g., better, same, worse; self-care, school, work, interactions)     Worse  6. SUPPORT: Who is with you now? Who do you live with? Do you have family or friends who you can talk to?      Boyfriend  7. THERAPIST: Do you have a counselor or therapist? If Yes, ask: What is their name?     Not at this moment  Protocols used: Depression-A-AH  Copied from CRM [id number]. Topic: Clinical - Red Word Triage >> Aug 04, 2024 10:18 AM Delon DASEN wrote: Red Word that prompted transfer to Nurse Triage: out of medications, is very depressed, not in a good place

## 2024-08-05 NOTE — Telephone Encounter (Signed)
 LVMCTB. Ok to advise if call is returned. Mychart message also sent

## 2024-08-22 ENCOUNTER — Encounter: Payer: Self-pay | Admitting: Family Medicine

## 2024-08-22 ENCOUNTER — Telehealth: Admitting: Family Medicine

## 2024-08-22 ENCOUNTER — Other Ambulatory Visit: Payer: Self-pay | Admitting: Family Medicine

## 2024-08-22 DIAGNOSIS — G8929 Other chronic pain: Secondary | ICD-10-CM

## 2024-08-22 DIAGNOSIS — F332 Major depressive disorder, recurrent severe without psychotic features: Secondary | ICD-10-CM

## 2024-08-22 DIAGNOSIS — G40909 Epilepsy, unspecified, not intractable, without status epilepticus: Secondary | ICD-10-CM | POA: Diagnosis not present

## 2024-08-22 DIAGNOSIS — G43109 Migraine with aura, not intractable, without status migrainosus: Secondary | ICD-10-CM | POA: Diagnosis not present

## 2024-08-22 DIAGNOSIS — G2581 Restless legs syndrome: Secondary | ICD-10-CM

## 2024-08-22 DIAGNOSIS — G4700 Insomnia, unspecified: Secondary | ICD-10-CM

## 2024-08-22 DIAGNOSIS — G709 Myoneural disorder, unspecified: Secondary | ICD-10-CM

## 2024-08-22 DIAGNOSIS — M25552 Pain in left hip: Secondary | ICD-10-CM

## 2024-08-22 DIAGNOSIS — K5909 Other constipation: Secondary | ICD-10-CM

## 2024-08-22 DIAGNOSIS — M25551 Pain in right hip: Secondary | ICD-10-CM

## 2024-08-22 DIAGNOSIS — F410 Panic disorder [episodic paroxysmal anxiety] without agoraphobia: Secondary | ICD-10-CM

## 2024-08-22 DIAGNOSIS — Z59819 Housing instability, housed unspecified: Secondary | ICD-10-CM

## 2024-08-22 MED ORDER — SUMATRIPTAN SUCCINATE 100 MG PO TABS: 100.0000 mg | ORAL_TABLET | ORAL | 1 refills | Status: DC | PRN

## 2024-08-22 MED ORDER — GABAPENTIN 300 MG PO CAPS: 300.0000 mg | ORAL_CAPSULE | Freq: Three times a day (TID) | ORAL | 0 refills | Status: DC

## 2024-08-22 MED ORDER — TRAMADOL HCL 50 MG PO TABS: 50.0000 mg | ORAL_TABLET | Freq: Every evening | ORAL | 0 refills | Status: AC | PRN

## 2024-08-22 MED ORDER — SERTRALINE HCL 50 MG PO TABS: ORAL_TABLET | ORAL | 0 refills | Status: DC

## 2024-08-22 MED ORDER — HYDROXYZINE HCL 25 MG PO TABS: 25.0000 mg | ORAL_TABLET | Freq: Three times a day (TID) | ORAL | 1 refills | Status: DC | PRN

## 2024-08-22 MED ORDER — TRAZODONE HCL 100 MG PO TABS: 100.0000 mg | ORAL_TABLET | Freq: Every day | ORAL | 0 refills | Status: DC

## 2024-08-22 MED ORDER — QUETIAPINE FUMARATE 50 MG PO TABS: 50.0000 mg | ORAL_TABLET | Freq: Two times a day (BID) | ORAL | 0 refills | Status: DC

## 2024-08-22 MED ORDER — TOPIRAMATE 25 MG PO TABS: ORAL_TABLET | ORAL | 0 refills | Status: DC

## 2024-08-22 MED ORDER — BUTALBITAL-APAP-CAFFEINE 50-300-40 MG PO CAPS: 1.0000 | ORAL_CAPSULE | Freq: Every day | ORAL | 0 refills | Status: AC | PRN

## 2024-08-22 NOTE — Progress Notes (Signed)
 MyChart Video Visit    Virtual Visit via Video Note   This format is felt to be most appropriate for this patient at this time. Physical exam was limited by quality of the video and audio technology used for the visit.   Patient location: [place], [place] Provider location: [facility]  I discussed the limitations of evaluation and management by telemedicine and the availability of in person appointments. The patient expressed understanding and agreed to proceed.  Patient: Chelsea Stanley   DOB: [birth year]   58 y.o. Female  MRN: [id number] Visit Date: 08/22/2024  Today's healthcare provider: LAURAINE LOISE BUOY, DO   No chief complaint on file.  Subjective    HPI  Chelsea Stanley is a 58 year old female who presents with medication management issues and worsening migraines.  She has been out of her medications for about two to three months, leading to a resurgence of symptoms. Her previous regimen included clonazepam , gabapentin , hydroxyzine , melatonin, Miralax , quetiapine , sertraline , sumatriptan, topiramate , tramadol , and trazodone . She is currently out of her migraine medication, experiencing increased frequency and severity of migraines, now occurring about once a week, whereas they were previously well-controlled with medication, occurring about once a month.  She experiences significant difficulty with sleep, only sleeping for short periods of 10 to 20 minutes at a time. She attributes this to being off her medications, which previously helped her maintain a more stable sleep pattern. She also has restless leg syndrome, which has worsened recently, describing it as a need to twitch or jerk her legs, especially when trying to sleep.  She has also been experiencing more pain in her right shoulder and her hips bilaterally, some of which she attributes to sleeping on an actual mattress and some of which she attributes to not having her medications.  She experiences  anxiety and mood instability, which she associates with being off her medications. She feels increasingly nervous and has had episodes of sweating and dizziness. No thoughts of self-harm but expresses significant distress over her current living situation and financial instability.  She reports a recent history of seizures, having experienced a few episodes while living in a hotel, but reports no seizures in the past few weeks.  She notes her boyfriend helps her to mitigate her seizure frequency by helping to calm her down.  She attributes some of her current symptoms to being off her medications, which she believes helped manage her anxiety and mood.  She is currently facing financial difficulties, impacting her ability to afford housing, transportation and medications. She is concerned about potential eviction due to an inability to pay rent. She finds comfort in her dog, which helps her cope with her emotional struggles.    Medications: Outpatient Medications Prior to Visit  Medication Sig Note   clonazePAM  (KLONOPIN ) 1 MG tablet Take 1 tablet by mouth 2 (two) times daily. (Patient not taking: Reported on 01/25/2024)    EPINEPHrine  0.3 mg/0.3 mL IJ SOAJ injection Inject 0.3 mg into the muscle as needed for anaphylaxis.    Melatonin 5 MG TABS Take 5 mg by mouth at bedtime as needed.    polyethylene glycol (MIRALAX  / GLYCOLAX ) packet Take 17 g by mouth every other day.    [DISCONTINUED] Butalbital-APAP-Caffeine 50-300-40 MG CAPS TAKE 1-2 CAPSULE EVERY FOUR HOURS AS NEEDED FOR HEADACHES. PLEASE USE SPARINGLY. MUST LAST 90 DAYS.    [DISCONTINUED] gabapentin  (NEURONTIN ) 300 MG capsule Take 1 capsule (300 mg total) by mouth 3 (three) times daily.    [  DISCONTINUED] hydrOXYzine  (ATARAX ) 25 MG tablet Take 1 tablet (25 mg total) by mouth 3 (three) times daily as needed for anxiety.    [DISCONTINUED] QUEtiapine  (SEROQUEL ) 50 MG tablet Take 50 mg by mouth 2 (two) times daily.    [DISCONTINUED] sertraline   (ZOLOFT ) 100 MG tablet Take 1 tablet (100 mg total) by mouth daily. 08/22/2024: cannot restart at origin doses   [DISCONTINUED] SUMAtriptan (IMITREX) 100 MG tablet Take 100 mg by mouth every 2 (two) hours as needed for migraine.    [DISCONTINUED] topiramate  (TOPAMAX ) 100 MG tablet Take 100 mg by mouth 2 (two) times daily. 08/22/2024: cannot restart at origin doses   [DISCONTINUED] traMADol  (ULTRAM ) 50 MG tablet Take 50 mg by mouth 2 (two) times daily as needed.    [DISCONTINUED] traZODone  (DESYREL ) 150 MG tablet Take 150 mg by mouth at bedtime as needed for sleep. 08/22/2024: cannot restart at origin doses   No facility-administered medications prior to visit.        Objective    There were no vitals taken for this visit.      Physical Exam Constitutional:      General: She is not in acute distress.    Appearance: Normal appearance.  HENT:     Head: Normocephalic.  Pulmonary:     Effort: Pulmonary effort is normal. No respiratory distress.  Neurological:     Mental Status: She is alert and oriented to person, place, and time. Mental status is at baseline.  Psychiatric:        Attention and Perception: Attention normal.        Mood and Affect: Mood is anxious and depressed. Affect is tearful.        Speech: Speech normal.        Behavior: Behavior is agitated.        Thought Content: Thought content does not include homicidal or suicidal ideation. Thought content does not include homicidal or suicidal plan.        Judgment: Judgment is not impulsive or inappropriate.       Assessment & Plan    Major depressive disorder, recurrent severe without psychotic features (HCC) -     AMB Referral VBCI Care Management -     Gabapentin ; Take 1 capsule (300 mg total) by mouth 3 (three) times daily.  Dispense: 90 capsule; Refill: 0 -     QUEtiapine  Fumarate; Take 1 tablet (50 mg total) by mouth 2 (two) times daily.  Dispense: 60 tablet; Refill: 0 -     Sertraline  HCl; Take one tablet (50  mg) daily for one week, then increase to two tablets (100 mg total) daily  Dispense: 60 tablet; Refill: 0  Severe anxiety with panic -     AMB Referral VBCI Care Management -     hydrOXYzine  HCl; Take 1 tablet (25 mg total) by mouth 3 (three) times daily as needed for anxiety.  Dispense: 30 tablet; Refill: 1  Migraine with aura and without status migrainosus, not intractable -     SUMAtriptan Succinate; Take 1 tablet (100 mg total) by mouth every 2 (two) hours as needed for migraine.  Dispense: 10 tablet; Refill: 1 -     Topiramate ; Week 1: Take 1 tablet daily.  Week 2: Take 2 tablets daily.  Week 3: Take 3 tablets daily.  Week 4: Take 4 tablets daily.  If unable to tolerate higher dose, stay at maximum tolerated dose.  Dispense: 120 tablet; Refill: 0 -     Butalbital-APAP-Caffeine; Take  1 capsule by mouth daily as needed. Use Sparingly.  Dispense: 28 capsule; Refill: 0 -     Ambulatory referral to Neurology  Nonintractable epilepsy without status epilepticus, unspecified epilepsy type (HCC) -     Ambulatory referral to Neurology  Neuromuscular disorder (HCC) -     Gabapentin ; Take 1 capsule (300 mg total) by mouth 3 (three) times daily.  Dispense: 90 capsule; Refill: 0  Insomnia, unspecified type -     traZODone  HCl; Take 1 tablet (100 mg total) by mouth at bedtime.  Dispense: 30 tablet; Refill: 0  Restless leg syndrome -     QUEtiapine  Fumarate; Take 1 tablet (50 mg total) by mouth 2 (two) times daily.  Dispense: 60 tablet; Refill: 0 -     traZODone  HCl; Take 1 tablet (100 mg total) by mouth at bedtime.  Dispense: 30 tablet; Refill: 0  Chronic hip pain, bilateral -     traMADol  HCl; Take 1 tablet (50 mg total) by mouth at bedtime as needed.  Dispense: 30 tablet; Refill: 0  Chronic constipation  Housing insecurity -     AMB Referral VBCI Care Management       Major depressive disorder, recurrent severe without psychotic features; severe anxiety with panic Increased symptoms  due to medication discontinuation and stressors.  Denies SI/HI at this time.  Discussed that walk-in clinic is available in [place] at [facility] sertraline  at 50 mg daily for a week, then increase to 100 mg.  Migraine Chronic migraines worsened by medication discontinuation. Ibuprofen  causing side effects. - Refill sumatriptan. - Initiate topiramate  with tapering schedule.  Patient originally on topiramate  100 mg twice daily. - [facility] and [facility] for [facility] supply.  Non-intractable epilepsy without status epilepticus, unspecified epilepsy type; neuromuscular disorder Reported history of epilepsy with recent seizure episodes reported. No seizures in past weeks but close calls noted.  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  Neuromuscular component previously treated with gabapentin .  Will restart today. - Refer to neurologist in [place] for further management.  Insomnia, unspecified type Chronic insomnia worsened by medication discontinuation. - Refill trazodone  - lower dose sent due to patient reportedly being off medication for several months.  Patient was originally on trazodone  150 mg nightly. - Advise continuation of melatonin as needed.  Restless leg syndrome Increased symptoms due to medication discontinuation.  Restart quetiapine  and trazodone  as noted.  Chronic hip pain bilaterally; right shoulder pain Increased pain due to medication discontinuation. - Refill tramadol .  Chronic constipation Chronic constipation managed with MiraLAX . - Continue MiraLAX  as needed.  Housing instability; general health maintenance Significant life stressors including potential eviction and financial instability. - Refer to social work for assistance with housing and financial resources.    Return in about 5 weeks (around 09/26/2024) for Anx/Dep, Chronic f/u, and in 3 months for mAWV with AWV nurse.     I discussed the  assessment and treatment plan with the patient. The patient was provided an opportunity to ask questions and all were answered. The patient agreed with the plan and demonstrated an understanding of the instructions.   The patient was advised to call back or seek an in-person evaluation if the symptoms worsen or if the condition fails to improve as anticipated.  I provided 40 minutes of virtual-face-to-face time during this encounter.  Total time was 50 minutes. That includes chart review before the visit, the actual patient visit, and time spent on documentation after the visit.  LAURAINE LOISE BUOY, DO [facility] [phone] (phone) [phone] (fax)  [facility]

## 2024-08-22 NOTE — Telephone Encounter (Signed)
 Copied from CRM [id number]. Topic: Clinical - Medication Refill >> Aug 22, 2024  3:01 PM Delon HERO wrote: Medication: clonazePAM  (KLONOPIN ) 1 MG tablet [[id number]]   All medications were refilled expect for this one.   Has the patient contacted their pharmacy? Yes (Agent: If no, request that the patient contact the pharmacy for the refill. If patient does not wish to contact the pharmacy document the reason why and proceed with request.) (Agent: If yes, when and what did the pharmacy advise?)  This is the patient's preferred pharmacy:  [id number] [place], [place] - [address] [place] [address] Phone: [phone] Fax: [phone]  [facility] - [place], [place] - [address] [address] [facility] [address] [place] [address] Phone: [phone] Fax: [phone]  Is this the correct pharmacy for this prescription? Yes If no, delete pharmacy and type the correct one.   Has the prescription been filled recently? Yes  Is the patient out of the medication? Yes  Has the patient been seen for an appointment in the last year OR does the patient have an upcoming appointment? Yes  Can we respond through MyChart? Yes  Agent: Please be advised that Rx refills may take up to 3 business days. We ask that you follow-up with your pharmacy.

## 2024-08-22 NOTE — Patient Instructions (Addendum)
 Call for appointment with psychiatry: [facility] [address], [address] [place], [place] [address] Office: [phone]  Fax: [phone]  As we discussed, a behavioral health walk-in clinic is available at this location: [facility] [address] [place], [place] [address] Phone: [phone]

## 2024-08-24 ENCOUNTER — Telehealth: Payer: Self-pay

## 2024-08-24 MED ORDER — CLONAZEPAM 1 MG PO TABS: 1.0000 mg | ORAL_TABLET | Freq: Two times a day (BID) | ORAL | 1 refills | Status: DC

## 2024-08-24 NOTE — Telephone Encounter (Signed)
 Requested medication (s) are due for refill today: Yes  Requested medication (s) are on the active medication list: Yes  Last refill:  06/15/22  Future visit scheduled: Yes  Notes to clinic:  Not delegated.    Requested Prescriptions  Pending Prescriptions Disp Refills   clonazePAM  (KLONOPIN ) 1 MG tablet 30 tablet     Sig: Take 1 tablet (1 mg total) by mouth 2 (two) times daily.     Not Delegated - Psychiatry: Anxiolytics/Hypnotics 2 Failed - 08/24/2024 10:01 AM      Failed - This refill cannot be delegated      Passed - Urine Drug Screen completed in last 360 days      Passed - Patient is not pregnant      Passed - Valid encounter within last 6 months    Recent Outpatient Visits           2 days ago Major depressive disorder, recurrent severe without psychotic features [facility])   [facility] Pardue, Lauraine SAILOR, DO   6 months ago Major depressive disorder, recurrent severe without psychotic features [facility])   [facility] Pardue, Lauraine SAILOR, DO

## 2024-08-24 NOTE — Progress Notes (Unsigned)
 Complex Care Management Note Care Guide Note  08/24/2024 Name: Chelsea Stanley MRN: [id number] DOB: August 10, 1966   Complex Care Management Outreach Attempts: An unsuccessful telephone outreach was attempted today to offer the patient information about available complex care management services.  Follow Up Plan:  Additional outreach attempts will be made to offer the patient complex care management information and services.   Encounter Outcome:  No Answer-Left voicemail  Leotis Rase [facility], [facility] [address] Guide  Direct Dial: [phone]  Fax [phone]

## 2024-08-25 NOTE — Progress Notes (Unsigned)
 Complex Care Management Note Care Guide Note  08/25/2024 Name: Virga Haltiwanger MRN: [id number] DOB: [birth year]   Complex Care Management Outreach Attempts: A second unsuccessful outreach was attempted today to offer the patient with information about available complex care management services.  Follow Up Plan:  Additional outreach attempts will be made to offer the patient complex care management information and services.   Encounter Outcome:  No Answer-Left voicemail  Leotis Rase [facility], [facility] Guide  Direct Dial: [phone]  Fax [phone]

## 2024-08-26 ENCOUNTER — Telehealth: Payer: Self-pay

## 2024-08-26 NOTE — Progress Notes (Signed)
 Complex Care Management Note Care Guide Note  08/26/2024 Name: Chelsea Stanley MRN: [id number] DOB: March 12, 1966   Complex Care Management Outreach Attempts: A third unsuccessful outreach was attempted today to offer the patient with information about available complex care management services.  Follow Up Plan:  No further outreach attempts will be made at this time. We have been unable to contact the patient to offer or enroll patient in complex care management services.  Encounter Outcome:  No Answer-Left voicemail   Chelsea Stanley [facility], [facility] Guide  Direct Dial: [phone]  Fax [phone]

## 2024-08-26 NOTE — Progress Notes (Signed)
 Complex Care Management Note  Care Guide Note 08/26/2024 Name: Chelsea Stanley MRN: [id number] DOB: [birth year]  Chelsea Stanley is a 58 y.o. year old female who sees Pardue, Lauraine SAILOR, DO for primary care. I reached out to [facility] by phone today to offer complex care management services.  Chelsea Stanley was given information about Complex Care Management services today including:   The Complex Care Management services include support from the care team which includes your Nurse Care Manager, Clinical Social Worker, or Pharmacist.  The Complex Care Management team is here to help remove barriers to the health concerns and goals most important to you. Complex Care Management services are voluntary, and the patient may decline or stop services at any time by request to their care team member.   Complex Care Management Consent Status: Patient agreed to services and verbal consent obtained.   Follow up plan:  Telephone appointment with complex care management team member scheduled for:  08/31/24 and 09/15/24  Encounter Outcome:  Patient Scheduled  Leotis Rase [facility], [facility] Guide  Direct Dial: [phone]  Fax [phone]

## 2024-08-27 MED ORDER — CLONAZEPAM 1 MG PO TABS: 1.0000 mg | ORAL_TABLET | Freq: Two times a day (BID) | ORAL | 0 refills | Status: DC

## 2024-08-27 NOTE — Telephone Encounter (Signed)
 Will send a short-term fill only.  Medication originally prescribed by neurology.    It is unclear whether this medication was originally meant to be a part of her antiepileptic drug regimen, as this is not specifically stated in neurology records accessible to me, or whether this was intended to address her severe anxiety and panic disorder (records also inaccessible).  Patient will need to establish with neurology locally and/or psychiatry (as appropriate as determined by these specialists, as she has been referred to both) for further recommendations regarding appropriateness of ongoing treatment with clonazepam .

## 2024-08-31 ENCOUNTER — Telehealth: Payer: Self-pay | Admitting: Family Medicine

## 2024-08-31 ENCOUNTER — Other Ambulatory Visit: Payer: Self-pay

## 2024-08-31 NOTE — Telephone Encounter (Signed)
 Copied from CRM [id number]. Topic: Appointments - Scheduling Inquiry for Clinic >> Aug 31, 2024 12:06 PM Gustabo D wrote: Pt has virtual appt today and says the link isn't working appt is with a Child psychotherapist at [employer]

## 2024-08-31 NOTE — Patient Outreach (Signed)
 Complex Care Management   Visit Note  08/31/2024  Name:  Chelsea Stanley MRN: [id number] DOB: 07-23-1966  Situation: Referral received for Complex Care Management related to SDOH Barriers:  Housing rent Food insecurity Lack of essential utilities lights Financial Resource Strain I obtained verbal consent from Patient.  Visit completed with Patient  on the phone  Background:   Past Medical History:  Diagnosis Date   Depression     Assessment: SW completed a telephone outreach with patient she states she is living with a friend and receives SSI. Patient states she is having a hard time keeping up with the rent. She does not receive foodstamps, but receives 400 each month from her [facility] OTC card. SW and patient agreed for resources for food, rent, and utilities to be emailed to address on file. SW encouraged patient to contact [employer] to inquire if an increase could be done for her SSI.  SDOH Interventions    Flowsheet Row Patient Outreach Telephone from 08/31/2024 in [place] [facility] POPULATION HEALTH DEPARTMENT Office Visit from 02/11/2024 in [facility]  SDOH Interventions    Food Insecurity Interventions Community Resources Provided --  Housing Interventions Community Resources Provided --  Transportation Interventions Community Resources Provided --  Depression Interventions/Treatment  -- Referral to Psychiatry, Counseling      No recommendations at this time  Follow Up Plan:   Telephone follow-up 15 days  Thersia Hoar, BSW, [place] [facility]  [facility] Social Worker, Population Health [phone]

## 2024-08-31 NOTE — Patient Instructions (Signed)
  Visit Information  Thank you for taking time to visit with me today. Please don't hesitate to contact me if I can be of assistance to you before our next scheduled appointment.  Our next appointment is by telephone on 09/21/24 at 09/21/24 at 1130 Please call the care guide team at [phone] if you need to cancel or reschedule your appointment.   Following is a copy of your care plan:   Goals Addressed             This Visit's Progress    BSW VBCI Social Work Care Plan       Problems:   Corporate treasurer , Geophysicist/field seismologist , and Housing   CSW Clinical Goal(s):   Over the next 30 days the Patient will work with Child psychotherapist to address concerns related to food, rent, utilities and increasing SSI.  Interventions:  SW emailed resources for food, rent, and utilities to email on file.  Patient Goals/Self-Care Activities:  Follow up with resources for community food options. Patient will apply for foodstamps and contact Social Security Administration for a possible increase.  Plan:   The care management team will reach out to the patient again over the next 15 days.        Please call the Suicide and Crisis Lifeline: 988 call the [place]  National Suicide Prevention Lifeline: (936)228-3038 or TTY: 410 350 9481 TTY 4145234335) to talk to a trained counselor call 1-800-273-TALK (toll free, 24 hour hotline) call 911 if you are experiencing a Mental Health or Behavioral Health Crisis or need someone to talk to.  Patient verbalizes understanding of instructions and care plan provided today and agrees to view in MyChart. Active MyChart status and patient understanding of how to access instructions and care plan via MyChart confirmed with patient.     Thersia Hoar, HEDWIG, MHA [place]  [facility] Social Worker, Population Health [phone]

## 2024-09-05 ENCOUNTER — Telehealth: Payer: Self-pay

## 2024-09-05 NOTE — Progress Notes (Signed)
 Complex Care Management Care Guide Note  09/05/2024 Name: Chelsea Stanley MRN: [id number] DOB: 01-Jan-1966  Chelsea Stanley is a 58 y.o. year old female who is a primary care patient of Pardue, Lauraine SAILOR, DO and is actively engaged with the care management team. I reached out to [facility] by phone today to assist with re-scheduling  with the BSW.  Follow up plan: Unsuccessful telephone outreach attempt made. A HIPAA compliant phone message was left for the patient providing contact information and requesting a return call.  Leotis Rase [facility], [facility] Guide  Direct Dial: [phone]  Fax [phone]

## 2024-09-08 NOTE — Addendum Note (Signed)
 Addended by: DONZELLA DOMINO on: 09/08/2024 12:38 AM   Modules accepted: Orders

## 2024-09-14 ENCOUNTER — Other Ambulatory Visit: Payer: Self-pay | Admitting: Family Medicine

## 2024-09-14 DIAGNOSIS — G2581 Restless legs syndrome: Secondary | ICD-10-CM

## 2024-09-14 DIAGNOSIS — F332 Major depressive disorder, recurrent severe without psychotic features: Secondary | ICD-10-CM

## 2024-09-15 ENCOUNTER — Other Ambulatory Visit: Payer: Self-pay | Admitting: *Deleted

## 2024-09-15 ENCOUNTER — Other Ambulatory Visit: Payer: Self-pay | Admitting: Family Medicine

## 2024-09-15 DIAGNOSIS — G4700 Insomnia, unspecified: Secondary | ICD-10-CM

## 2024-09-15 DIAGNOSIS — G2581 Restless legs syndrome: Secondary | ICD-10-CM

## 2024-09-16 NOTE — Patient Instructions (Signed)
 Visit Information  Thank you for taking time to visit with me today. Please don't hesitate to contact me if I can be of assistance to you before our next scheduled appointment.  Our next appointment is by telephone on 09/30/24 at 9am Please call the care guide team at [phone] if you need to cancel or reschedule your appointment.   Following is a copy of your care plan:   Goals Addressed             This Visit's Progress    VBCI Social Work Care Plan-LCSW       Problems:   Care coordination related to management of Anxiety   CSW Clinical Goal(s):   Over the next 60days the Patient will establish self with an outpatient therapist and psychiatrist to assist with symptom management related to anxiety evidenced by patient report  Interventions:  Mental Health:  Evaluation of current treatment plan related to Anxiety- Excessive Worry, Social Anxiety, Active listening / Reflection utilized Discussed referral options to connect for ongoing therapy: confirmed referral for [facility] initial appointment on 09/27/24 Emotional Support Provided PHQ2/PHQ9 completed GAD 7 completed Solution-Focused Strategies employed: medication management, diversional activities  Suicidal Ideation assessed: patient denied thoughts of self harm   Patient Goals/Self-Care Activities:  Coordinate with [facility]  to assist with medication management and ongoing mental health therapy.  Plan:   Telephone follow up appointment with care management team member scheduled for:  09/30/24        Please call the Suicide and Crisis Lifeline: 988 call the [place]  National Suicide Prevention Lifeline: (331) 175-3271 or TTY: (346) 738-9684 TTY (859) 471-7918) to talk to a trained counselor call 1-800-273-TALK (toll free, 24 hour hotline) call 911 if you are experiencing a Mental Health or Behavioral Health Crisis or need someone to talk to.  Patient verbalized understanding of  Care plan and visit instructions communicated this visit  Chelsea Gartman, LCSW [facility], [facility] Licensed Clinical Social Worker  Direct Dial: [phone]

## 2024-09-16 NOTE — Patient Outreach (Signed)
 Complex Care Management   Visit Note  09/16/2024  Name:  Chelsea Stanley MRN: [id number] DOB: [birth year]  Situation: Referral received for Complex Care Management related to Mental/Behavioral Health diagnosis Anxiety and Depression I obtained verbal consent from Patient.  Visit completed with Patient  on the phone on 09/15/24  Background:   Past Medical History:  Diagnosis Date   Depression     Assessment: Patient Reported Symptoms:  Cognitive Cognitive Status: Alert and oriented to person, place, and time, Insightful and able to interpret abstract concepts, Normal speech and language skills Cognitive/Intellectual Conditions Management [RPT]: Other Other: hx stroke 2016   Health Maintenance Behaviors: Annual physical exam Healing Pattern: Average Health Facilitated by: Rest, Stress management  Neurological Neurological Review of Symptoms: Headaches Neurological Management Strategies: Coping strategies, Medication therapy Neurological Comment: has frequent migraines-scheduled to see Neurology 10/30/24 9:15am  HEENT HEENT Symptoms Reported: Tinnitus      Cardiovascular Cardiovascular Symptoms Reported: Chest pain or discomfort Does patient have uncontrolled Hypertension?: No Cardiovascular Management Strategies: Coping strategies Cardiovascular Comment: hx of heart attack 2017, chest pain started 3 weeks ago, wakes her up at night will call doctor today to discuss  Respiratory Respiratory Symptoms Reported: Shortness of breath Other Respiratory Symptoms: during excertion mainly ie walking the dog Respiratory Management Strategies: Adequate rest Respiratory Self-Management Outcome: 3 (uncertain)  Endocrine Endocrine Symptoms Reported: No symptoms reported Is patient diabetic?: No    Gastrointestinal Gastrointestinal Symptoms Reported: Abdominal pain or discomfort Additional Gastrointestinal Details: per patient, feel it is appendix pain      Genitourinary Genitourinary  Symptoms Reported: No symptoms reported    Integumentary Integumentary Symptoms Reported: Bruising    Musculoskeletal Musculoskelatal Symptoms Reviewed: Difficulty walking, Joint pain, Limited mobility Additional Musculoskeletal Details: uses a cain at time for mobility        Psychosocial Psychosocial Symptoms Reported: Anxiety - if selected complete GAD Additional Psychological Details: Ingreased Anxiety, panic attacks when in public, does not go out. fears the outside world Had a good therapist for a long time in [place] but she left, was assigned another therapist but did not return-5 or 6 years ago, open to seeing another therapist-has appt with [facility] 09/27/24 Behavioral Management Strategies: Adequate rest, Coping strategies, Medication therapy, Counseling Major Change/Loss/Stressor/Fears (CP): Medical condition, self, Relationship concerns Behaviors When Feeling Stressed/Fearful: walks dog, makes self get out more often if though she may not want to Techniques to Cope with Loss/Stress/Change: Diversional activities, Medication Quality of Family Relationships: helpful, involved, supportive Do you feel physically threatened by others?: No    09/16/2024    PHQ2-9 Depression Screening   Little interest or pleasure in doing things More than half the days  Feeling down, depressed, or hopeless Nearly every day  PHQ-2 - Total Score 5  Trouble falling or staying asleep, or sleeping too much Nearly every day  Feeling tired or having little energy More than half the days  Poor appetite or overeating  Not at all  Feeling bad about yourself - or that you are a failure or have let yourself or your family down Several days  Trouble concentrating on things, such as reading the newspaper or watching television Nearly every day  Moving or speaking so slowly that other people could have noticed.  Or the opposite - being so fidgety or restless that you have been moving around  a lot more than usual Not at all  Thoughts that you would be better off dead, or hurting yourself in some way Not  at all  PHQ2-9 Total Score 14  If you checked off any problems, how difficult have these problems made it for you to do your work, take care of things at home, or get along with other people    Depression Interventions/Treatment Referral to Psychiatry, Counseling    There were no vitals filed for this visit.  Medications Reviewed Today     Reviewed by Ermalinda Lenn HERO, LCSW (Social Worker) on 09/15/24 at [phone]  Med List Status: <None>   Medication Order Taking? Sig Documenting Provider Last Dose Status Informant  Butalbital-APAP-Caffeine 50-300-40 MG CAPS [id number] Yes Take 1 capsule by mouth daily as needed. Use Sparingly. Donzella Lauraine SAILOR, DO  Active   clonazePAM  (KLONOPIN ) 1 MG tablet [id number] Yes Take 1 tablet (1 mg total) by mouth 2 (two) times daily. SHORT TERM FILL ONLY. Need to establish with neurology for refills. Donzella Lauraine SAILOR, DO  Active   EPINEPHrine  0.3 mg/0.3 mL IJ SOAJ injection [id number] Yes Inject 0.3 mg into the muscle as needed for anaphylaxis. Donzella Lauraine SAILOR, DO  Active   gabapentin  (NEURONTIN ) 300 MG capsule [id number] Yes Take 1 capsule (300 mg total) by mouth 3 (three) times daily. Donzella Lauraine SAILOR, DO  Active   hydrOXYzine  (ATARAX ) 25 MG tablet [id number] Yes Take 1 tablet (25 mg total) by mouth 3 (three) times daily as needed for anxiety. Donzella Lauraine SAILOR, DO  Active   Melatonin 5 MG TABS [id number] Yes Take 5 mg by mouth at bedtime as needed. [provider]  Active Friend, Pharmacy Records           Med Note CATHY OVAL VEAR Pablo Jan 25, 2024  5:45 PM) prn  polyethylene glycol (MIRALAX  / GLYCOLAX ) packet [id number] Yes Take 17 g by mouth every other day. [provider]  Active Friend, Pharmacy Records  QUEtiapine  (SEROQUEL ) 50 MG tablet [id number] Yes Take 1 tablet (50 mg total) by mouth 2 (two) times daily. Donzella Lauraine SAILOR, DO  Active    sertraline  (ZOLOFT ) 50 MG tablet [id number] Yes Take one tablet (50 mg) daily for one week, then increase to two tablets (100 mg total) daily Pardue, Sarah N, DO  Active   SUMAtriptan (IMITREX) 100 MG tablet [id number] Yes Take 1 tablet (100 mg total) by mouth every 2 (two) hours as needed for migraine. Donzella Lauraine SAILOR, DO  Active   topiramate  (TOPAMAX ) 25 MG tablet [id number] Yes Week 1: Take 1 tablet daily.  Week 2: Take 2 tablets daily.  Week 3: Take 3 tablets daily.  Week 4: Take 4 tablets daily.  If unable to tolerate higher dose, stay at maximum tolerated dose. Donzella Lauraine SAILOR, DO  Active   traMADol  (ULTRAM ) 50 MG tablet [id number] Yes Take 1 tablet (50 mg total) by mouth at bedtime as needed. Donzella Lauraine SAILOR, DO  Active   traZODone  (DESYREL ) 100 MG tablet [id number] Yes Take 1 tablet (100 mg total) by mouth at bedtime. Donzella Lauraine SAILOR, DO  Active             Recommendation:   PCP Follow-up Ongoing mental health counseling-[facility]  Follow Up Plan:   Telephone follow up appointment date/time:  09/30/24  Lenn Ermalinda, LCSW [place]  [facility], [facility] Licensed Clinical Social Worker  Direct Dial: [phone]

## 2024-09-20 ENCOUNTER — Emergency Department
Admission: EM | Admit: 2024-09-20 | Discharge: 2024-09-20 | Disposition: A | Discharge status: Home / Self Care | Attending: Emergency Medicine | Admitting: Emergency Medicine

## 2024-09-20 ENCOUNTER — Other Ambulatory Visit: Payer: Self-pay

## 2024-09-20 ENCOUNTER — Emergency Department

## 2024-09-20 DIAGNOSIS — R4182 Altered mental status, unspecified: Secondary | ICD-10-CM | POA: Diagnosis not present

## 2024-09-20 DIAGNOSIS — R569 Unspecified convulsions: Secondary | ICD-10-CM | POA: Insufficient documentation

## 2024-09-20 DIAGNOSIS — F32A Depression, unspecified: Secondary | ICD-10-CM | POA: Diagnosis not present

## 2024-09-20 DIAGNOSIS — R944 Abnormal results of kidney function studies: Secondary | ICD-10-CM | POA: Insufficient documentation

## 2024-09-20 DIAGNOSIS — E875 Hyperkalemia: Secondary | ICD-10-CM | POA: Insufficient documentation

## 2024-09-20 LAB — CBC WITH DIFFERENTIAL/PLATELET
Abs Immature Granulocytes: 0.02 K/uL (ref 0.00–0.07)
Basophils Absolute: 0 K/uL (ref 0.0–0.1)
Basophils Relative: 1 %
Eosinophils Absolute: 0 K/uL (ref 0.0–0.5)
Eosinophils Relative: 1 %
HCT: 43.1 % (ref 36.0–46.0)
Hemoglobin: 13.5 g/dL (ref 12.0–15.0)
Immature Granulocytes: 0 %
Lymphocytes Relative: 42 %
Lymphs Abs: 2.1 K/uL (ref 0.7–4.0)
MCH: 27.4 pg (ref 26.0–34.0)
MCHC: 31.3 g/dL (ref 30.0–36.0)
MCV: 87.6 fL (ref 80.0–100.0)
Monocytes Absolute: 0.4 K/uL (ref 0.1–1.0)
Monocytes Relative: 8 %
Neutro Abs: 2.3 K/uL (ref 1.7–7.7)
Neutrophils Relative %: 48 %
Platelets: 228 K/uL (ref 150–400)
RBC: 4.92 MIL/uL (ref 3.87–5.11)
RDW: 13.8 % (ref 11.5–15.5)
WBC: 4.8 K/uL (ref 4.0–10.5)
nRBC: 0 % (ref 0.0–0.2)

## 2024-09-20 LAB — COMPREHENSIVE METABOLIC PANEL WITH GFR
ALT: 10 U/L (ref 0–44)
AST: 29 U/L (ref 15–41)
Albumin: 4 g/dL (ref 3.5–5.0)
Alkaline Phosphatase: 81 U/L (ref 38–126)
Anion gap: 9 (ref 5–15)
BUN: 11 mg/dL (ref 6–20)
CO2: 25 mmol/L (ref 22–32)
Calcium: 9.3 mg/dL (ref 8.9–10.3)
Chloride: 110 mmol/L (ref 98–111)
Creatinine, Ser: 1.07 mg/dL — ABNORMAL HIGH (ref 0.44–1.00)
GFR, Estimated: >60 mL/min (ref >60)
Glucose, Bld: 104 mg/dL — ABNORMAL HIGH (ref 70–99)
Potassium: 5.6 mmol/L — ABNORMAL HIGH (ref 3.5–5.1)
Sodium: 144 mmol/L (ref 135–145)
Total Bilirubin: 0.6 mg/dL (ref 0.0–1.2)
Total Protein: 7.5 g/dL (ref 6.5–8.1)

## 2024-09-20 LAB — BASIC METABOLIC PANEL WITH GFR
Anion gap: 7 (ref 5–15)
BUN: 9 mg/dL (ref 6–20)
CO2: 23 mmol/L (ref 22–32)
Calcium: 8.8 mg/dL — ABNORMAL LOW (ref 8.9–10.3)
Chloride: 107 mmol/L (ref 98–111)
Creatinine, Ser: 0.81 mg/dL (ref 0.44–1.00)
GFR, Estimated: >60 mL/min (ref >60)
Glucose, Bld: 101 mg/dL — ABNORMAL HIGH (ref 70–99)
Potassium: 3.8 mmol/L (ref 3.5–5.1)
Sodium: 137 mmol/L (ref 135–145)

## 2024-09-20 LAB — BLOOD GAS, VENOUS
Acid-base deficit: 2.5 mmol/L — ABNORMAL HIGH (ref 0.0–2.0)
Acid-base deficit: 0.6 mmol/L (ref 0.0–2.0)
Bicarbonate: 26.1 mmol/L (ref 20.0–28.0)
Bicarbonate: 24.9 mmol/L (ref 20.0–28.0)
O2 Saturation: 33.7 %
O2 Saturation: 57.9 %
Patient temperature: 37
Patient temperature: 37
pCO2, Ven: 61 mmHg — ABNORMAL HIGH (ref 44–60)
pCO2, Ven: 43 mmHg — ABNORMAL LOW (ref 44–60)
pH, Ven: 7.24 — ABNORMAL LOW (ref 7.25–7.43)
pH, Ven: 7.37 (ref 7.25–7.43)
pO2, Ven: 33 mmHg (ref 32–45)
pO2, Ven: 37 mmHg (ref 32–45)

## 2024-09-20 LAB — URINE DRUG SCREEN, QUALITATIVE (ARMC ONLY)
Amphetamines, Ur Screen: NOT DETECTED
Barbiturates, Ur Screen: NOT DETECTED
Benzodiazepine, Ur Scrn: POSITIVE — AB
Cannabinoid 50 Ng, Ur ~~LOC~~: NOT DETECTED
Cocaine Metabolite,Ur ~~LOC~~: NOT DETECTED
MDMA (Ecstasy)Ur Screen: NOT DETECTED
Methadone Scn, Ur: NOT DETECTED
Opiate, Ur Screen: NOT DETECTED
Phencyclidine (PCP) Ur S: NOT DETECTED
Tricyclic, Ur Screen: NOT DETECTED

## 2024-09-20 LAB — HCG, QUANTITATIVE, PREGNANCY: hCG, Beta Chain, Quant, S: 6 m[IU]/mL — ABNORMAL HIGH (ref <5)

## 2024-09-20 LAB — URINALYSIS, ROUTINE W REFLEX MICROSCOPIC
Bacteria, UA: NONE SEEN
Bilirubin Urine: NEGATIVE
Glucose, UA: NEGATIVE mg/dL
Ketones, ur: NEGATIVE mg/dL
Leukocytes,Ua: NEGATIVE
Nitrite: NEGATIVE
Protein, ur: NEGATIVE mg/dL
Specific Gravity, Urine: 1.014 (ref 1.005–1.030)
pH: 5 (ref 5.0–8.0)

## 2024-09-20 LAB — ACETAMINOPHEN LEVEL: Acetaminophen (Tylenol), Serum: <10 ug/mL — ABNORMAL LOW (ref 10–30)

## 2024-09-20 LAB — ETHANOL: Alcohol, Ethyl (B): <15 mg/dL (ref <15)

## 2024-09-20 LAB — CBG MONITORING, ED: Glucose-Capillary: 74 mg/dL (ref 70–99)

## 2024-09-20 LAB — AMMONIA: Ammonia: 18 umol/L (ref 9–35)

## 2024-09-20 LAB — CK: Total CK: 75 U/L (ref 38–234)

## 2024-09-20 LAB — SALICYLATE LEVEL
Salicylate Lvl: <7 mg/dL — ABNORMAL LOW (ref 7.0–30.0)
Salicylate Lvl: <7 mg/dL — ABNORMAL LOW (ref 7.0–30.0)

## 2024-09-20 MED ORDER — LORAZEPAM 2 MG/ML IJ SOLN: 0.5000 mg | Freq: Once | INTRAMUSCULAR | Status: AC

## 2024-09-20 MED ORDER — SODIUM CHLORIDE 0.9 % IV BOLUS
1000.0000 mL | Freq: Once | INTRAVENOUS | Status: AC
  Administered 2024-09-20: 1000 mL via INTRAVENOUS

## 2024-09-20 MED ORDER — LORAZEPAM 2 MG/ML IJ SOLN
INTRAMUSCULAR | Status: AC
  Administered 2024-09-20: 0.5 mg via INTRAVENOUS
  Filled 2024-09-20: qty 1

## 2024-09-20 MED ORDER — SODIUM ZIRCONIUM CYCLOSILICATE 10 G PO PACK
10.0000 g | PACK | Freq: Once | ORAL | Status: AC
  Administered 2024-09-20: 10 g via ORAL
  Filled 2024-09-20: qty 1

## 2024-09-20 NOTE — Progress Notes (Signed)
 Eeg done

## 2024-09-20 NOTE — Procedures (Signed)
 Routine EEG Report  Chelsea Stanley is a 58 y.o. female with a history of altered mental status who is undergoing an EEG to evaluate for seizures.  Report: This EEG was acquired with electrodes placed according to the International 10-20 electrode system (including Fp1, Fp2, F3, F4, C3, C4, P3, P4, O1, O2, T3, T4, T5, T6, A1, A2, Fz, Cz, Pz). The following electrodes were missing or displaced: none.  The occipital dominant rhythm was 10-11 Hz with overriding beta frequencies. This activity is reactive to stimulation. Drowsiness was manifested by background fragmentation; deeper stages of sleep were not identified. There was no focal slowing. There were no interictal epileptiform discharges. There were no electrographic seizures identified. There was no abnormal response to photic stimulation or hyperventilation.   Impression: This EEG was obtained while awake and drowsy and is normal. Episodes of eye fluttering and one episode of unresponsiveness to sternal rub had no ictal correlate on EEG and were NON-epileptic.    Clinical Correlation: Normal EEGs, however, do not rule out epilepsy.  Chelsea Ross, MD [facility] [phone]  If 7pm- 7am, please page neurology on call as listed in AMION.

## 2024-09-20 NOTE — ED Provider Notes (Signed)
 EEG without clear evidence of epilepsy. Psychiatry evaluated the patient and did not feel she requires inpatient admission at this time. On my exam patient is awake and alert. Does have some sensation change and weakness to the right upper extremity which she says has been present and why she has had work done on her right neck/shoulder. At this time think it is reasonable for patient to be discharged home.   Floy Roberts, MD 09/20/24 [phone]
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
## 2024-09-20 NOTE — ED Notes (Signed)
 RN obtained permission from pt to discuss plan of care with boyfriend. RN explained plan of care for pt at this time. Medical workup was discussed along with psychiatry consult. Boyfriend aware that psychiatry has not evaluated pt at this time. Boyfriend given psych hold guidelines, phone hours, and visitor restrictions at this time. Boyfriend denies any other questions at this time.
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
## 2024-09-20 NOTE — ED Triage Notes (Signed)
 Pt comes in via [facility] from [facility] after falleing out of her wheelchair while having a seizure. Pt reported to have 3 witnessed seizures with EMS. Pt received a 10 of vesed total with EMS. Pt has a history of diabetes, and seizures. Pt post ictal on arrival. EDP Funke at bedside.

## 2024-09-20 NOTE — ED Notes (Signed)
 Pt with seizure like activity. MD Ernest made aware.

## 2024-09-20 NOTE — BH Assessment (Signed)
 IRIS request has been made for patient to be seen.

## 2024-09-20 NOTE — ED Notes (Signed)
 Patient transported to CT

## 2024-09-20 NOTE — ED Notes (Signed)
 Pt given dinner tray at this time.

## 2024-09-20 NOTE — Consult Note (Signed)
 Per review of notes, patient is currently undergoing EEG and awaiting recommendations from neurology due to seizure-like activity.  Psychiatry will assess patient once cleared by neurology.

## 2024-09-20 NOTE — ED Notes (Signed)
 This RN witnessed pt take her nasal cannula out of her nose and place cannula in her mouth and proceed to chew on the the cannula. This RN attempted to remove cannula from pt's mouth, pt continued to hold on to cannula with her teeth. MD Ernest, and charge RN Powell made aware.

## 2024-09-20 NOTE — ED Notes (Signed)
 EDT Camilla assigned as recruitment consultant for pt.

## 2024-09-20 NOTE — ED Provider Notes (Addendum)
 [facility] Provider Note    Event Date/Time   First MD Initiated Contact with Patient 09/20/24 1309     (approximate)   History   Seizures   HPI  Chelsea Stanley is a 58 y.o. female with history of nonepileptic seizures followed at neurology at [facility] who comes in with concerns for seizures.  I reviewed a note from 08/22/2024 for patient is being followed for depression she is on gabapentin , Seroquel , sertraline .  She also has a history of migraines.  She also has a history of restless leg syndrome.  Patient was reportedly at the grocery store when she started having seizures.  Unclear if she hit her head.  Patient was given a total of 10 IM Versed .  Patient comes in with erratic movements, moving all extremities but been nonverbal.   Physical Exam   Triage Vital Signs: ED Triage Vitals [09/20/24 1302]  Encounter Vitals Group     BP      Girls Systolic BP Percentile      Girls Diastolic BP Percentile      Boys Systolic BP Percentile      Boys Diastolic BP Percentile      Pulse Rate 80     Resp 15     Temp 97.6 F (36.4 C)     Temp src      SpO2 99 %     Weight      Height 5' (1.524 m)     Head Circumference      Peak Flow      Pain Score 0     Pain Loc      Pain Education      Exclude from Growth Chart     Most recent vital signs: Vitals:   09/20/24 1302  Pulse: 80  Resp: 15  Temp: 97.6 F (36.4 C)  SpO2: 99%     General: Awake, no distress.  CV:  Good peripheral perfusion.  Resp:  Normal effort.  Abd:  No distention.  Soft and nontender Other:  Patient's eyes are Closed.  She is erratically moving.   ED Results / Procedures / Treatments   Labs (all labs ordered are listed, but only abnormal results are displayed) Labs Reviewed  BLOOD GAS, VENOUS - Abnormal; Notable for the following components:      Result Value   pH, Ven 7.24 (*)    pCO2, Ven 61 (*)    Acid-base deficit 2.5 (*)    All other components within  normal limits  COMPREHENSIVE METABOLIC PANEL WITH GFR - Abnormal; Notable for the following components:   Potassium 5.6 (*)    Glucose, Bld 104 (*)    Creatinine, Ser 1.07 (*)    All other components within normal limits  ACETAMINOPHEN  LEVEL - Abnormal; Notable for the following components:   Acetaminophen  (Tylenol ), Serum <10 (*)    All other components within normal limits  SALICYLATE LEVEL - Abnormal; Notable for the following components:   Salicylate Lvl <7.0 (*)    All other components within normal limits  HCG, QUANTITATIVE, PREGNANCY - Abnormal; Notable for the following components:   hCG, Beta Chain, Quant, S 6 (*)    All other components within normal limits  CBC WITH DIFFERENTIAL/PLATELET  ETHANOL  CK  URINE DRUG SCREEN, QUALITATIVE ([facility] ONLY)  URINALYSIS, ROUTINE W REFLEX MICROSCOPIC  AMMONIA  CBG MONITORING, ED     EKG  My interpretation of EKG:  Normal sinus rate of 78 without any  ST elevation or T wave inversions, normal intervals  RADIOLOGY I have reviewed the ct personally and interpreted no evidence of intracranial hemorrhage  PROCEDURES:  Critical Care performed: Yes, see critical care procedure note(s)  .1-3 Lead EKG Interpretation  Performed by: Ernest Ronal BRAVO, MD Authorized by: Ernest Ronal BRAVO, MD     Interpretation: normal     ECG rate:  70   ECG rate assessment: normal     Rhythm: sinus rhythm     Ectopy: none     Conduction: normal   .Critical Care  Performed by: Ernest Ronal BRAVO, MD Authorized by: Ernest Ronal BRAVO, MD   Critical care provider statement:    Critical care time (minutes):  30   Critical care was necessary to treat or prevent imminent or life-threatening deterioration of the following conditions:  CNS failure or compromise   Critical care was time spent personally by me on the following activities:  Development of treatment plan with patient or surrogate, discussions with consultants, evaluation of patient's response to treatment,  examination of patient, ordering and review of laboratory studies, ordering and review of radiographic studies, ordering and performing treatments and interventions, pulse oximetry, re-evaluation of patient's condition and review of old charts    MEDICATIONS ORDERED IN ED: Medications  LORazepam  (ATIVAN ) 2 MG/ML injection (has no administration in time range)     IMPRESSION / MDM / ASSESSMENT AND PLAN / ED COURSE  I reviewed the triage vital signs and the nursing notes.   Patient's presentation is most consistent with acute presentation with potential threat to life or bodily function.   Patient comes in with concerns for seizures.  Patient is nonverbal and she is making erratic movements that do not really seem consistent with normal epileptic seizures.  She is holding her hands up over her arms but she is not able to respond and have a conversation with me.  I will order CT imaging to make sure no evidence of intercranial hemorrhage however patient Moving all around so patient was given 0.5 Ativan  to help with some sedation in case these were epileptic seizures.  I reviewed her chart where patient has had a history of nonepileptic seizures so I do suspect that this is most likely what is going on.  It does appear that she has got psychiatric history as well.  No report of there being any attempt to hurt herself.  Seems less likely LVO or stroke as she is moving all extremities with nonfocal examination.   VBG does show some hypercapnia.  CBC reassuring CMP with some elevated potassium of 5.6 and creatinine of 1.07.  Tylenol  levels negative salicylate level is negative alcohol level is negative CK is normal hCG slightly elevated but similar to 1 year ago so doubt pregnancy.  CT imaging of the head and neck are negative.  Will give patient 2 L of fluids, similar, to help with elevated potassium level and recheck VBG, CMP.    I did discuss case with neurology and Dr. Matthews did recommend doing  EEG.  If this is negative the patient will be cleared from a neurology perspective.  Did put in psychiatric consults that she has required this previously.  4:03 PM consider getting CTA on angio but patient is now patient is now more awake she is able to follow commands.  She is currently being hooked up to EEG.  Will hold off on putting her on BiPAP so patient can get EEG I think we can get  fluids.  I wonder if her VBG was a little hypercapnic just secondary to the Versed  as she is now awake.  Patient does appear slightly confused but she is getting nonfocal examination although some what limited as she is on eeg and I dont want to affect her results\ but she is able to move all extremities no facial droop noted.  However we will repeat VBG and if it remains hypercapnic could consider doing BiPAP given now she is more awake.   Patient be handed off to oncoming team pending repeat VBG and BMP after fluids patient is also getting EEG and further monitoring, repeat neuro evaluation  I placed patient under psychiatric consultation for them to evaluate her.  She denies any SI at this time.  But she has been seen by psychiatric services before for possible nonepileptic seizures.   The patient is on the cardiac monitor to evaluate for evidence of arrhythmia and/or significant heart rate changes.      FINAL CLINICAL IMPRESSION(S) / ED DIAGNOSES   Final diagnoses:  Seizure (HCC)     Rx / DC Orders   ED Discharge Orders     None        Note:  This document was prepared using Dragon voice recognition software and may include unintentional dictation errors.   Ernest Ronal BRAVO, MD 09/20/24 1604    Ernest Ronal BRAVO, MD 09/20/24 1617    Ernest Ronal BRAVO, MD 09/20/24 [phone]

## 2024-09-21 ENCOUNTER — Telehealth: Payer: Self-pay

## 2024-09-21 NOTE — Patient Instructions (Signed)
 Nailani Erskin - I am sorry I was unable to reach you today for our scheduled appointment. I work with Donzella Lauraine SAILOR, DO and am calling to support your healthcare needs. Please contact me at [phone] at your earliest convenience. I look forward to speaking with you soon.   Thank you,  Thersia Hoar, BSW, MHA [place]  [facility] Social Worker, Population Health [phone]

## 2024-09-22 ENCOUNTER — Ambulatory Visit: Payer: Self-pay | Admitting: *Deleted

## 2024-09-22 NOTE — Telephone Encounter (Signed)
 FYI Only or Action Required?: FYI only for provider: appointment scheduled on 09/23/24.  Patient was last seen in primary care on 08/22/2024 by Donzella Lauraine SAILOR, DO.  Called Nurse Triage reporting Chest Pain.  Symptoms began several weeks ago.  Interventions attempted: Nothing.  Symptoms are: unchanged.  Triage Disposition: See Physician Within 24 Hours  Patient/caregiver understands and will follow disposition?: Yes  Copied from CRM #[id number]. Topic: Clinical - Red Word Triage >> Sep 22, 2024  2:40 PM Tobias L wrote: Red Word that prompted transfer to Nurse Triage: dizziness, chest pain, palpitation 1-2 times a week, high scores on depression and anxiety screen tools Reason for Disposition  [1] Chest pain lasts > 5 minutes AND [2] occurred > 3 days ago (72 hours) AND [3] NO chest pain or cardiac symptoms now  Answer Assessment - Initial Assessment Questions NP- [facility] home visit- reporting findings: Concerns about depression screening: GAB 7 -19 score, not suicidal - and has NP appointment with psychiatry on 11/11. Patient c/o- new onset dizziness, chest pain daily with varying time and radiation into the left. Palpations also noted in complaint- appointment scheduled for this since dizziness is new with other symptoms Abnormal mini cognitive result- patient reports some trouble with memory since stroke.   1. LOCATION: Where does it hurt?       Feels like angina- left 2. RADIATION: Does the pain go anywhere else? (e.g., into neck, jaw, arms, back)     Shoulder, back, abdomen 3. ONSET: When did the chest pain begin? (Minutes, hours or days)      Daily- 2-3 weeks 4. PATTERN: Does the pain come and go, or has it been constant since it started?  Does it get worse with exertion?      Heaviness, sharp pain 5. DURATION: How long does it last (e.g., seconds, minutes, hours)     5-30 min 6. SEVERITY: How bad is the pain?  (e.g., Scale 1-10; mild, moderate, or severe)     8/10 7.  CARDIAC RISK FACTORS: Do you have any history of heart problems or risk factors for heart disease? (e.g., angina, prior heart attack; diabetes, high blood pressure, high cholesterol, smoker, or strong family history of heart disease)     Hx heart attack 8. PULMONARY RISK FACTORS: Do you have any history of lung disease?  (e.g., blood clots in lung, asthma, emphysema, birth control pills)     no 9. CAUSE: What do you think is causing the chest pain?     No changes  10. OTHER SYMPTOMS: Do you have any other symptoms? (e.g., dizziness, nausea, vomiting, sweating, fever, difficulty breathing, cough)       Dizziness, palpatation  Protocols used: Chest Pain-A-AH

## 2024-09-23 ENCOUNTER — Ambulatory Visit: Admitting: Physician Assistant
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
## 2024-09-26 ENCOUNTER — Other Ambulatory Visit: Payer: Self-pay | Admitting: Family Medicine

## 2024-09-26 ENCOUNTER — Telehealth: Payer: Self-pay | Admitting: Family Medicine

## 2024-09-26 ENCOUNTER — Ambulatory Visit: Admitting: Family Medicine

## 2024-09-26 DIAGNOSIS — G4700 Insomnia, unspecified: Secondary | ICD-10-CM

## 2024-09-26 DIAGNOSIS — F332 Major depressive disorder, recurrent severe without psychotic features: Secondary | ICD-10-CM

## 2024-09-26 DIAGNOSIS — G43109 Migraine with aura, not intractable, without status migrainosus: Secondary | ICD-10-CM

## 2024-09-26 DIAGNOSIS — G2581 Restless legs syndrome: Secondary | ICD-10-CM

## 2024-09-26 DIAGNOSIS — G709 Myoneural disorder, unspecified: Secondary | ICD-10-CM

## 2024-09-26 DIAGNOSIS — F410 Panic disorder [episodic paroxysmal anxiety] without agoraphobia: Secondary | ICD-10-CM

## 2024-09-26 NOTE — Telephone Encounter (Signed)
 Chelsea Stanley mistaken her appointment to be at 10am today. She has re-scheduled for this Wednesday 11/12. She requested that her medications be refilled. Please advise.

## 2024-09-26 NOTE — Telephone Encounter (Signed)
 What medication is she requesting the refill on?

## 2024-09-27 ENCOUNTER — Encounter: Payer: Self-pay | Admitting: Psychiatry

## 2024-09-27 ENCOUNTER — Other Ambulatory Visit: Payer: Self-pay

## 2024-09-27 ENCOUNTER — Ambulatory Visit (INDEPENDENT_AMBULATORY_CARE_PROVIDER_SITE_OTHER): Admitting: Psychiatry

## 2024-09-27 VITALS — BP 137/86 | HR 69 | Temp 97.2°F | Ht 60.0 in | Wt 150.4 lb

## 2024-09-27 DIAGNOSIS — G709 Myoneural disorder, unspecified: Secondary | ICD-10-CM

## 2024-09-27 DIAGNOSIS — G4700 Insomnia, unspecified: Secondary | ICD-10-CM

## 2024-09-27 DIAGNOSIS — F4310 Post-traumatic stress disorder, unspecified: Secondary | ICD-10-CM | POA: Diagnosis not present

## 2024-09-27 DIAGNOSIS — G43109 Migraine with aura, not intractable, without status migrainosus: Secondary | ICD-10-CM

## 2024-09-27 DIAGNOSIS — Z79899 Other long term (current) drug therapy: Secondary | ICD-10-CM

## 2024-09-27 DIAGNOSIS — F332 Major depressive disorder, recurrent severe without psychotic features: Secondary | ICD-10-CM

## 2024-09-27 DIAGNOSIS — F331 Major depressive disorder, recurrent, moderate: Secondary | ICD-10-CM

## 2024-09-27 DIAGNOSIS — F411 Generalized anxiety disorder: Secondary | ICD-10-CM

## 2024-09-27 DIAGNOSIS — G2581 Restless legs syndrome: Secondary | ICD-10-CM

## 2024-09-27 MED ORDER — TRAZODONE HCL 150 MG PO TABS: 150.0000 mg | ORAL_TABLET | Freq: Every day | ORAL | 1 refills | Status: DC

## 2024-09-27 NOTE — Telephone Encounter (Signed)
 FYI....... Both medications Trazodone  100 mg and Hydroxyzine  was filled by Dr. Donzella on 08/22/2024 with 1 refill.  Today Trazodone  150 mg was filled by [facility], refills were refused.

## 2024-09-27 NOTE — Addendum Note (Signed)
 Addended by: TERREL POWELL CROME on: 09/27/2024 01:41 PM   Modules accepted: Orders

## 2024-09-27 NOTE — Telephone Encounter (Signed)
 LOV- 02/11/2024 NOV-

## 2024-09-27 NOTE — Patient Instructions (Signed)
 Continue sertraline  100 mg daily Continue quetiapine  50 mg twice a day Continue hydroxyzine  25 mg daily as needed for anxiety   Increase trazodone  150 mg at night as needed for insomnia Next appointment- 12/30 at 8:30

## 2024-09-28 ENCOUNTER — Ambulatory Visit (INDEPENDENT_AMBULATORY_CARE_PROVIDER_SITE_OTHER): Admitting: Family Medicine

## 2024-09-28 ENCOUNTER — Encounter: Payer: Self-pay | Admitting: Family Medicine

## 2024-09-28 ENCOUNTER — Ambulatory Visit: Attending: Family Medicine

## 2024-09-28 VITALS — BP 142/91 | HR 79 | Temp 98.2°F | Ht 60.0 in | Wt 152.3 lb

## 2024-09-28 DIAGNOSIS — F332 Major depressive disorder, recurrent severe without psychotic features: Secondary | ICD-10-CM

## 2024-09-28 DIAGNOSIS — R079 Chest pain, unspecified: Secondary | ICD-10-CM

## 2024-09-28 DIAGNOSIS — G43109 Migraine with aura, not intractable, without status migrainosus: Secondary | ICD-10-CM

## 2024-09-28 DIAGNOSIS — G2581 Restless legs syndrome: Secondary | ICD-10-CM

## 2024-09-28 DIAGNOSIS — R42 Dizziness and giddiness: Secondary | ICD-10-CM

## 2024-09-28 DIAGNOSIS — R110 Nausea: Secondary | ICD-10-CM

## 2024-09-28 DIAGNOSIS — R202 Paresthesia of skin: Secondary | ICD-10-CM

## 2024-09-28 DIAGNOSIS — R29898 Other symptoms and signs involving the musculoskeletal system: Secondary | ICD-10-CM

## 2024-09-28 DIAGNOSIS — Z136 Encounter for screening for cardiovascular disorders: Secondary | ICD-10-CM

## 2024-09-28 DIAGNOSIS — Z23 Encounter for immunization: Secondary | ICD-10-CM

## 2024-09-28 DIAGNOSIS — Z1231 Encounter for screening mammogram for malignant neoplasm of breast: Secondary | ICD-10-CM

## 2024-09-28 DIAGNOSIS — Z1159 Encounter for screening for other viral diseases: Secondary | ICD-10-CM

## 2024-09-28 DIAGNOSIS — G709 Myoneural disorder, unspecified: Secondary | ICD-10-CM

## 2024-09-28 DIAGNOSIS — G4700 Insomnia, unspecified: Secondary | ICD-10-CM

## 2024-09-28 DIAGNOSIS — Z114 Encounter for screening for human immunodeficiency virus [HIV]: Secondary | ICD-10-CM

## 2024-09-28 DIAGNOSIS — F410 Panic disorder [episodic paroxysmal anxiety] without agoraphobia: Secondary | ICD-10-CM

## 2024-09-28 MED ORDER — SERTRALINE HCL 100 MG PO TABS: 100.0000 mg | ORAL_TABLET | Freq: Every day | ORAL | 3 refills | Status: DC

## 2024-09-28 MED ORDER — TOPIRAMATE 25 MG PO TABS
ORAL_TABLET | ORAL | 0 refills | Status: AC
Start: 2024-09-28 — End: ?

## 2024-09-28 MED ORDER — QUETIAPINE FUMARATE 50 MG PO TABS: 50.0000 mg | ORAL_TABLET | Freq: Two times a day (BID) | ORAL | 0 refills | Status: DC

## 2024-09-28 MED ORDER — HYDROXYZINE HCL 25 MG PO TABS: 25.0000 mg | ORAL_TABLET | Freq: Every day | ORAL | 1 refills | Status: DC | PRN

## 2024-09-28 MED ORDER — CLONAZEPAM 1 MG PO TABS: 1.0000 mg | ORAL_TABLET | Freq: Two times a day (BID) | ORAL | 0 refills | Status: AC

## 2024-09-28 MED ORDER — GABAPENTIN 300 MG PO CAPS: 300.0000 mg | ORAL_CAPSULE | Freq: Three times a day (TID) | ORAL | 0 refills | Status: AC

## 2024-09-28 NOTE — Patient Instructions (Addendum)
 Please call the [facility] [phone]) to schedule a routine screening mammogram.   Recommended vaccines to get at the pharmacy: Tdap (tetanus, diphtheria and pertussis), Shingrix (shingles), and COVID booster for 2025.

## 2024-09-28 NOTE — Progress Notes (Signed)
 Established patient visit   Patient: Chelsea Stanley   DOB: [birth year]   58 y.o. Female  MRN: [id number] Visit Date: 09/28/2024  Today's healthcare provider: LAURAINE LOISE BUOY, DO   Chief Complaint  Patient presents with   Medical Management of Chronic Issues    Reports she just recently had a seizure.  Vaccines- flu, pneumonia   Depression    States that it is the same.   Anxiety    States that it is the same.   Subjective    Depression        Past medical history includes anxiety.   Anxiety    Chelsea Stanley is a 58 year old female who presents with dizziness, shortness of breath, and chest pain.  She has been experiencing dizziness intermittently for about three weeks, sometimes accompanied by nausea. Episodes of shortness of breath began around the same time, occurring with or without exertion. The sensation of shortness of breath varies, sometimes feeling like 'the air, like when a balloon's already empty,' making it difficult to breathe in. This sensation radiates around her back and abdomen.  She reports chest pain that started around the same time as the dizziness and shortness of breath. The symptoms occur at different times and are not always noticeable. She describes a past experience of a heart attack or similar event in 2016, 2017, or 2018, and mentions a 'stupid syndrome thing' that causes symptoms that do not feel like a heart attack but can escalate quickly.  She has a history of her leg giving out. She is currently on sertraline , quetiapine , hydroxyzine , trazodone , clonazepam , and topiramate  (four tablets daily). 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  She has not been screened for hepatitis C or HIV previously but agrees to be tested during this visit. 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 She also notes jitteriness in her mouth, which she attributes to nervousness.   Plan according to recent visit (09/27/2024) with psychiatry, Dr. Katheren Hisada: Continue sertraline  100 mg daily Continue quetiapine  50 mg twice a day Continue hydroxyzine  25 mg daily as needed for anxiety Increase trazodone  to 150 mg at night as needed for insomnia   Nurse practitioner from [facility] saw patient at her home 09/25/2024  - recommended motorized wheelchair  - palpitations 1-2x week       Medications: Outpatient Medications Prior to Visit  Medication Sig Note   Butalbital -APAP-Caffeine  50-300-40 MG CAPS Take 1 capsule by mouth daily as needed. Use Sparingly.    EPINEPHrine  0.3 mg/0.3 mL IJ SOAJ injection Inject 0.3 mg into the muscle as needed for anaphylaxis.    Melatonin 5 MG TABS Take 5 mg by mouth at bedtime as needed.    polyethylene glycol (MIRALAX  / GLYCOLAX ) packet Take 17 g by mouth every other day.    traMADol  (ULTRAM ) 50 MG tablet Take 1 tablet (50 mg total) by mouth at bedtime as needed.    [DISCONTINUED] clonazePAM  (KLONOPIN ) 1 MG tablet Take 1 tablet (1 mg total) by mouth 2 (two) times daily. SHORT TERM FILL ONLY. Need to establish with neurology for refills.    [DISCONTINUED] gabapentin  (NEURONTIN ) 300 MG capsule Take 1 capsule (300 mg total) by mouth 3 (three) times daily.    [DISCONTINUED] hydrOXYzine  (ATARAX ) 25 MG tablet  Take 1 tablet (25 mg total) by mouth 3 (three) times daily as needed for anxiety.    [DISCONTINUED] QUEtiapine  (SEROQUEL ) 50 MG tablet Take 1 tablet (50 mg total) by mouth 2 (two) times daily.    [DISCONTINUED] sertraline  (ZOLOFT ) 50 MG tablet Take one tablet (50 mg) daily for one week, then increase to two tablets (100 mg total) daily    [DISCONTINUED] SUMAtriptan  (IMITREX ) 100 MG tablet Take 1 tablet (100 mg total) by mouth every 2 (two) hours as needed for migraine.    [DISCONTINUED]  topiramate  (TOPAMAX ) 25 MG tablet Week 1: Take 1 tablet daily.  Week 2: Take 2 tablets daily.  Week 3: Take 3 tablets daily.  Week 4: Take 4 tablets daily.  If unable to tolerate higher dose, stay at maximum tolerated dose.    [DISCONTINUED] traZODone  (DESYREL ) 100 MG tablet Take 1 tablet (100 mg total) by mouth at bedtime. 09/28/2024: by psych   [DISCONTINUED] traZODone  (DESYREL ) 150 MG tablet Take 1 tablet (150 mg total) by mouth at bedtime.    No facility-administered medications prior to visit.        Objective    BP (!) 142/91 (BP Location: Right Arm, Patient Position: Sitting, Cuff Size: Normal)   Pulse 79   Temp 98.2 F (36.8 C) (Oral)   Ht 5' (1.524 m)   Wt 152 lb 4.8 oz (69.1 kg)   SpO2 97%   BMI 29.74 kg/m     Physical Exam Vitals and nursing note reviewed.  Constitutional:      General: She is not in acute distress.    Appearance: Normal appearance.  HENT:     Head: Normocephalic and atraumatic.  Eyes:     General: No scleral icterus.    Conjunctiva/sclera: Conjunctivae normal.  Cardiovascular:     Rate and Rhythm: Normal rate.  Pulmonary:     Effort: Pulmonary effort is normal.  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  Neurological:     Mental Status: She is alert and oriented to person, place, and time. Mental status is at baseline.  Psychiatric:        Mood and Affect: Mood normal.        Behavior: Behavior normal.      Results for orders placed or performed in visit on 09/28/24  Troponin T  Result Value Ref Range   Troponin T (Highly Sensitive) 7 0 - 14 ng/L  Comprehensive metabolic panel with GFR  Result Value Ref Range   Glucose 109 (H) 70 - 99 mg/dL   BUN 12 6 - 24 mg/dL   Creatinine, Ser 9.08 0.57 - 1.00 mg/dL   eGFR 74 >40 fO/fpw/8.26   BUN/Creatinine Ratio 13 9 - 23   Sodium 142 134 - 144 mmol/L    Potassium 4.6 3.5 - 5.2 mmol/L   Chloride 107 (H) 96 - 106 mmol/L   CO2 20 20 - 29 mmol/L   Calcium 9.7 8.7 - 10.2 mg/dL   Total Protein 7.2 6.0 - 8.5 g/dL   Albumin 4.6 3.8 - 4.9 g/dL   Globulin, Total 2.6 1.5 - 4.5 g/dL   Bilirubin Total 0.4 0.0 - 1.2 mg/dL   Alkaline Phosphatase 111 49 - 135 IU/L   AST 16 0 - 40 IU/L   ALT 7 0 - 32 IU/L  Hemoglobin A1c  Result Value Ref Range  Hgb A1c MFr Bld 5.0 4.8 - 5.6 %   Est. average glucose Bld gHb Est-mCnc 97 mg/dL  Lipid panel  Result Value Ref Range   Cholesterol, Total 251 (H) 100 - 199 mg/dL   Triglycerides 869 0 - 149 mg/dL   HDL 59 >60 mg/dL   VLDL Cholesterol Cal 23 5 - 40 mg/dL   LDL Chol Calc (NIH) 830 (H) 0 - 99 mg/dL   Chol/HDL Ratio 4.3 0.0 - 4.4 ratio  VITAMIN D  25 Hydroxy (Vit-D Deficiency, Fractures)  Result Value Ref Range   Vit D, 25-Hydroxy 14.0 (L) 30.0 - 100.0 ng/mL  TSH Rfx on Abnormal to Free T4  Result Value Ref Range   TSH 2.250 0.450 - 4.500 uIU/mL  HCV Ab w Reflex to Quant PCR  Result Value Ref Range   HCV Ab Non Reactive Non Reactive  Vitamin B12  Result Value Ref Range   Vitamin B-12 332 232 - 1,245 pg/mL  HIV Antibody (routine testing w rflx)  Result Value Ref Range   HIV Screen 4th Generation wRfx Non Reactive Non Reactive  Interpretation:  Result Value Ref Range   HCV Interp 1: Comment     Assessment & Plan    Major depressive disorder, recurrent severe without psychotic features (HCC) -     Gabapentin ; Take 1 capsule (300 mg total) by mouth 3 (three) times daily.  Dispense: 90 capsule; Refill: 0 -     QUEtiapine  Fumarate; Take 1 tablet (50 mg total) by mouth 2 (two) times daily.  Dispense: 60 tablet; Refill: 0  Insomnia, unspecified type  Severe anxiety with panic -     hydrOXYzine  HCl; Take 1 tablet (25 mg total) by mouth daily as needed for anxiety.  Dispense: 30 tablet; Refill: 1  Migraine with aura and without status migrainosus, not intractable -     Topiramate ; Week 1: Take 1  tablet daily.  Week 2: Take 2 tablets daily.  Week 3: Take 3 tablets daily.  Week 4: Take 4 tablets daily.  If unable to tolerate higher dose, stay at maximum tolerated dose.  Dispense: 120 tablet; Refill: 0  Neuromuscular disorder (HCC) -     clonazePAM ; Take 1 tablet (1 mg total) by mouth 2 (two) times daily. SHORT TERM FILL ONLY. Need to establish with neurology for future prescriptions.  Dispense: 70 tablet; Refill: 0 -     Gabapentin ; Take 1 capsule (300 mg total) by mouth 3 (three) times daily.  Dispense: 90 capsule; Refill: 0  Transient right leg weakness -     Ambulatory referral to Physical Therapy  Dizziness -     LONG TERM MONITOR (3-14 DAYS); Future  Nausea -     LONG TERM MONITOR (3-14 DAYS); Future  Chest pain, unspecified type -     LONG TERM MONITOR (3-14 DAYS); Future -     Ambulatory referral to Cardiology -     Troponin T -     Comprehensive metabolic panel with GFR  Paresthesia -     Hemoglobin A1c -     VITAMIN D  25 Hydroxy (Vit-D Deficiency, Fractures) -     TSH Rfx on Abnormal to Free T4 -     Vitamin B12  Encounter for hepatitis C screening test for low risk patient -     HCV Ab w Reflex to Quant PCR -     Interpretation:  Restless leg syndrome -     QUEtiapine  Fumarate; Take 1 tablet (50 mg total) by  mouth 2 (two) times daily.  Dispense: 60 tablet; Refill: 0  Encounter for screening for cardiovascular disorders -     Lipid panel  Encounter for screening mammogram for breast cancer -     3D Screening Mammogram, Left and Right; Future  Encounter for screening for HIV -     HIV Antibody (routine testing w rflx)  Need for pneumococcal vaccination -     Pneumococcal conjugate vaccine 20-valent  Need for influenza vaccination -     Flu vaccine trivalent PF, 6mos and older(Flulaval,Afluria,Fluarix,Fluzone)      Chest pain, dizziness, shortness of breath, and nausea Intermittent symptoms for three weeks. Previous monitoring unremarkable. Possible  relation to previous heart attack and syndrome. - Order blood work for further evaluation as noted. - Ordered long-term monitor for cardiac evaluation. - Scheduled follow-up appointment next week for further evaluation.  Major depressive disorder, recurrent without psychotic features; severe anxiety with panic Continued management with sertraline  and quetiapine .   - Continue sertraline  and quetiapine . - Refilled hydroxyzine . - Patient now follows with psychiatry; defer to specialist management.  Insomnia, unspecified type Increased trazodone  dosage available at the pharmacy, sent by psychiatry. - Continue increased trazodone  dosage. - Follows with psychiatry; defer to specialist management  Neuromuscular disorder; transient right leg weakness  Functional evaluation needed for leg weakness and gait abnormality. - Sent referral to PT for functional evaluation.  Plan to send prescription for wheelchair based on findings of evaluation.  Paresthesia Reports paresthesia around her mouth; will evaluate for possible metabolic sources as noted.  General health maintenance Screening discussed. Mammogram ordered. Hepatitis C and HIV screening agreed upon. Due for flu, pneumonia, shingles, tetanus, and COVID booster vaccines. - Administered flu and pneumonia vaccines today. - Advised to obtain shingles, tetanus, and COVID booster vaccines at the pharmacy.  - Ordered mammogram. - Screened for hepatitis C and HIV.    Return in about 1 week (around 10/05/2024) for MMSE, Chronic f/u.      I discussed the assessment and treatment plan with the patient  The patient was provided an opportunity to ask questions and all were answered. The patient agreed with the plan and demonstrated an understanding of the instructions.   The patient was advised to call back or seek an in-person evaluation if the symptoms worsen or if the condition fails to improve as anticipated.    LAURAINE LOISE BUOY, DO  [facility]  [facility] [phone] (phone) [phone] (fax)  [facility]

## 2024-09-29 ENCOUNTER — Ambulatory Visit: Payer: Self-pay

## 2024-09-29 LAB — LIPID PANEL
Chol/HDL Ratio: 4.3 ratio (ref 0.0–4.4)
Cholesterol, Total: 251 mg/dL — ABNORMAL HIGH (ref 100–199)
HDL: 59 mg/dL (ref >39)
LDL Chol Calc (NIH): 169 mg/dL — ABNORMAL HIGH (ref 0–99)
Triglycerides: 130 mg/dL (ref 0–149)
VLDL Cholesterol Cal: 23 mg/dL (ref 5–40)

## 2024-09-29 LAB — COMPREHENSIVE METABOLIC PANEL WITH GFR
ALT: 7 IU/L (ref 0–32)
AST: 16 IU/L (ref 0–40)
Albumin: 4.6 g/dL (ref 3.8–4.9)
Alkaline Phosphatase: 111 IU/L (ref 49–135)
BUN/Creatinine Ratio: 13 (ref 9–23)
BUN: 12 mg/dL (ref 6–24)
Bilirubin Total: 0.4 mg/dL (ref 0.0–1.2)
CO2: 20 mmol/L (ref 20–29)
Calcium: 9.7 mg/dL (ref 8.7–10.2)
Chloride: 107 mmol/L — ABNORMAL HIGH (ref 96–106)
Creatinine, Ser: 0.91 mg/dL (ref 0.57–1.00)
Globulin, Total: 2.6 g/dL (ref 1.5–4.5)
Glucose: 109 mg/dL — ABNORMAL HIGH (ref 70–99)
Potassium: 4.6 mmol/L (ref 3.5–5.2)
Sodium: 142 mmol/L (ref 134–144)
Total Protein: 7.2 g/dL (ref 6.0–8.5)
eGFR: 74 mL/min/1.73 (ref >59)

## 2024-09-29 LAB — TROPONIN T: Troponin T (Highly Sensitive): 7 ng/L (ref 0–14)

## 2024-09-29 LAB — VITAMIN D 25 HYDROXY (VIT D DEFICIENCY, FRACTURES): Vit D, 25-Hydroxy: 14 ng/mL — ABNORMAL LOW (ref 30.0–100.0)

## 2024-09-29 LAB — HCV AB W REFLEX TO QUANT PCR: HCV Ab: NONREACTIVE

## 2024-09-29 LAB — VITAMIN B12: Vitamin B-12: 332 pg/mL (ref 232–1245)

## 2024-09-29 LAB — HEMOGLOBIN A1C
Est. average glucose Bld gHb Est-mCnc: 97 mg/dL
Hgb A1c MFr Bld: 5 % (ref 4.8–5.6)

## 2024-09-29 LAB — TSH RFX ON ABNORMAL TO FREE T4: TSH: 2.25 u[IU]/mL (ref 0.450–4.500)

## 2024-09-29 LAB — HIV ANTIBODY (ROUTINE TESTING W REFLEX): HIV Screen 4th Generation wRfx: NONREACTIVE

## 2024-09-29 LAB — HCV INTERPRETATION

## 2024-09-29 NOTE — Telephone Encounter (Signed)
  FYI Only or Action Required?: Action required by provider: update on patient condition.  Patient was last seen in primary care on 09/28/2024 by Donzella Lauraine SAILOR, DO.  Called Nurse Triage reporting vaccine reaction.  Symptoms began today.  Interventions attempted: Nothing.- will take sumatriptan  Symptoms are: unchanged.  Triage Disposition: Home Care  Patient/caregiver understands and will follow disposition?: Yes Copied from CRM #[id number]. Topic: Clinical - Red Word Triage >> Sep 29, 2024 11:31 AM Hadassah PARAS wrote: Red Word that prompted transfer to Nurse Triage: Pt had a pneomonia and flu shot. Pt is experiencing a horrendous headache, tightness on throat. She is unsure if these are side effects and if she should take her SUMAtriptan Reason for Disposition  Pneumococcal vaccine reactions  Answer Assessment - Initial Assessment Questions 1. SYMPTOMS: What is the main symptom? (e.g., pain, redness, or swelling at injection site; feeling tired, fever, muscle aches)      Head ache, asked if she should take sumatriptan.  Confirmed that it is ok to take sumatriptan 2. ONSET: When was the vaccine (shot) given? How much later did the headache begin? (e.g., hours, days ago)   3. SEVERITY: How bad is it?      Worsening, has history of migraine 4. FEVER: Do you have a fever? If Yes, ask: What is your temperature, how was it measured, and when did it start?      denies 5. IMMUNIZATIONS GIVEN: What shots have you recently received?     Flu, pneumonia 6. PAST REACTIONS: Have you reacted to immunizations before? If Yes, ask: What happened?     Cold symptoms with flu vaccine 7. OTHER SYMPTOMS: Do you have any other symptoms?     Head ache, throat clearing  Protocols used: Immunization Reactions-A-AH

## 2024-09-30 ENCOUNTER — Encounter: Payer: Self-pay | Admitting: *Deleted

## 2024-09-30 ENCOUNTER — Ambulatory Visit: Payer: Self-pay | Admitting: Family Medicine

## 2024-09-30 ENCOUNTER — Telehealth: Payer: Self-pay | Admitting: *Deleted

## 2024-09-30 DIAGNOSIS — E559 Vitamin D deficiency, unspecified: Secondary | ICD-10-CM

## 2024-09-30 MED ORDER — VITAMIN D (ERGOCALCIFEROL) 1.25 MG (50000 UNIT) PO CAPS: 50000.0000 [IU] | ORAL_CAPSULE | ORAL | 1 refills | Status: AC

## 2024-09-30 NOTE — Patient Instructions (Signed)
 Chelsea Stanley - I am sorry I was unable to reach you today for our scheduled appointment. I work with Donzella Lauraine SAILOR, DO and am calling to support your healthcare needs. Please contact me at [phone] at your earliest convenience. I look forward to speaking with you soon.   Thank you,    Kamala Kolton, LCSW [place]  [facility], [facility] Licensed Clinical Social Worker  Direct Dial: [phone]

## 2024-10-03 ENCOUNTER — Telehealth: Payer: Self-pay

## 2024-10-03 NOTE — Patient Instructions (Signed)
 Chelsea Stanley - I am sorry I was unable to reach you today for our scheduled appointment. I work with Donzella Lauraine SAILOR, DO and am calling to support your healthcare needs. Please contact me at [phone] at your earliest convenience. I look forward to speaking with you soon.   Thank you,  Thersia Hoar, BSW, MHA [place]  [facility] Social Worker, Population Health [phone]

## 2024-10-05 ENCOUNTER — Encounter: Payer: Self-pay | Admitting: Family Medicine

## 2024-10-05 ENCOUNTER — Ambulatory Visit (INDEPENDENT_AMBULATORY_CARE_PROVIDER_SITE_OTHER): Admitting: Family Medicine

## 2024-10-05 VITALS — BP 101/65 | HR 63 | Temp 97.9°F | Ht 60.0 in | Wt 148.8 lb

## 2024-10-05 DIAGNOSIS — R413 Other amnesia: Secondary | ICD-10-CM

## 2024-10-05 DIAGNOSIS — R4189 Other symptoms and signs involving cognitive functions and awareness: Secondary | ICD-10-CM | POA: Diagnosis not present

## 2024-10-05 NOTE — Progress Notes (Signed)
 Established patient visit   Patient: Chelsea Stanley   DOB: [birth year]   58 y.o. Female  MRN: [id number] Visit Date: 10/05/2024  Today's healthcare provider: LAURAINE LOISE BUOY, DO   Chief Complaint  Patient presents with   Follow-up    Patient states she is here for a 1 week follow up.  Reports she is wearing a Zio that was recommended by provider.  Vaccines declined.   Subjective    HPI Chavela Justiniano Kyra is a 58 year old female who presents with concerns about memory issues.  During the exam, she was able to recall some words from a list, specifically 'daisy' and 'red', and mentioned that 'daisy' was the name of her dog. She struggled with some tasks, such as repeating sentences exactly and performing serial subtraction of seven from one hundred. She was unable to identify the current month as November and was unsure of the exact date. She correctly identified the year and the location as the clinic in [place].  She has a loss adjuster, chartered, having completed a two-year degree.  She remains concerned about her memory, stating that she gets lost if she walks too far and forget things a lot.  She also notes that she repeats herself and forgets where she places things frequently.  She notes that she recently forgot to turn her stove cough and left her oven on.     Medications: Outpatient Medications Prior to Visit  Medication Sig   Butalbital-APAP-Caffeine 50-300-40 MG CAPS Take 1 capsule by mouth daily as needed. Use Sparingly.   clonazePAM  (KLONOPIN ) 1 MG tablet Take 1 tablet (1 mg total) by mouth 2 (two) times daily. SHORT TERM FILL ONLY. Need to establish with neurology for future prescriptions.   EPINEPHrine  0.3 mg/0.3 mL IJ SOAJ injection Inject 0.3 mg into the muscle as needed for anaphylaxis.   gabapentin  (NEURONTIN ) 300 MG capsule Take 1 capsule (300 mg total) by mouth 3 (three) times daily.   hydrOXYzine  (ATARAX ) 25 MG tablet Take 1 tablet (25 mg total) by  mouth daily as needed for anxiety.   Melatonin 5 MG TABS Take 5 mg by mouth at bedtime as needed.   polyethylene glycol (MIRALAX  / GLYCOLAX ) packet Take 17 g by mouth every other day.   QUEtiapine  (SEROQUEL ) 50 MG tablet Take 1 tablet (50 mg total) by mouth 2 (two) times daily.   sertraline  (ZOLOFT ) 100 MG tablet Take 1 tablet (100 mg total) by mouth daily.   SUMAtriptan (IMITREX) 100 MG tablet Take 1 tablet (100 mg total) by mouth every 2 (two) hours as needed for migraine.   topiramate  (TOPAMAX ) 25 MG tablet Week 1: Take 1 tablet daily.  Week 2: Take 2 tablets daily.  Week 3: Take 3 tablets daily.  Week 4: Take 4 tablets daily.  If unable to tolerate higher dose, stay at maximum tolerated dose.   traMADol  (ULTRAM ) 50 MG tablet Take 1 tablet (50 mg total) by mouth at bedtime as needed.   traZODone  (DESYREL ) 150 MG tablet Take 1 tablet (150 mg total) by mouth at bedtime.   Vitamin D, Ergocalciferol, (DRISDOL) 1.25 MG (50000 UNIT) CAPS capsule Take 1 capsule (50,000 Units total) by mouth every 7 (seven) days.   No facility-administered medications prior to visit.        Objective    BP 101/65 (BP Location: Right Arm, Patient Position: Sitting, Cuff Size: Normal)   Pulse 63   Temp 97.9 F (36.6 C) (Oral)   Ht  5' (1.524 m)   Wt 148 lb 12.8 oz (67.5 kg)   SpO2 98%   BMI 29.06 kg/m     Physical Exam Vitals and nursing note reviewed.  Constitutional:      General: She is not in acute distress.    Appearance: Normal appearance.  HENT:     Head: Normocephalic and atraumatic.  Eyes:     General: No scleral icterus.    Conjunctiva/sclera: Conjunctivae normal.  Cardiovascular:     Rate and Rhythm: Normal rate.  Pulmonary:     Effort: Pulmonary effort is normal.  Neurological:     Mental Status: She is alert and oriented to person, place, and time. Mental status is at baseline.  Psychiatric:        Mood and Affect: Mood normal.        Behavior: Behavior normal.      No results  found for any visits on 10/05/24.  Assessment & Plan    Moderate cognitive impairment  Memory changes    Moderate cognitive impairment; memory changes Difficulty with recall, mental arithmetic, sentence repetition, and orientation. Needs help with financial tasks.  Testing today as follows: MoCA (19/30) and FAQ (19/30). - Recommend follow-up with neurology for further assessment.  Referral is in place and patient is scheduled for consultation on 10/31/2024.    Return in about 6 weeks (around 11/16/2024) for mAWV with AWV nurse and in 3 months for chronic f/u.      I discussed the assessment and treatment plan with the patient  The patient was provided an opportunity to ask questions and all were answered. The patient agreed with the plan and demonstrated an understanding of the instructions.   The patient was advised to call back or seek an in-person evaluation if the symptoms worsen or if the condition fails to improve as anticipated.    LAURAINE LOISE BUOY, DO  [facility] [phone] (phone) [phone] (fax)  [facility]

## 2024-10-05 NOTE — Patient Instructions (Signed)
 Please call the [facility] [phone]) to schedule a routine screening mammogram.

## 2024-10-08 ENCOUNTER — Other Ambulatory Visit: Payer: Self-pay | Admitting: Family Medicine

## 2024-10-08 DIAGNOSIS — G43109 Migraine with aura, not intractable, without status migrainosus: Secondary | ICD-10-CM

## 2024-10-10 NOTE — Telephone Encounter (Signed)
 LOV- 10/05/2024 NOV- None LRF- 08/22/2024 Outpatient Medication Detail   Disp Refills Start End   SUMAtriptan  (IMITREX ) 100 MG tablet 10 tablet 1 08/22/2024 --   Sig - Route: Take 1 tablet (100 mg total) by mouth every 2 (two) hours as needed for migraine. - Oral   Sent to pharmacy as: SUMAtriptan  (IMITREX ) 100 MG tablet   E-Prescribing Status: Receipt confirmed by pharmacy (08/22/2024  2:25 PM EDT)

## 2024-10-21 ENCOUNTER — Encounter: Payer: Self-pay | Admitting: *Deleted

## 2024-10-21 ENCOUNTER — Other Ambulatory Visit: Payer: Self-pay | Admitting: Psychiatry

## 2024-10-21 ENCOUNTER — Telehealth: Admitting: *Deleted

## 2024-10-21 NOTE — Patient Instructions (Signed)
 Chelsea Stanley - I am sorry I was unable to reach you today.  I work with Donzella Lauraine SAILOR, DO and am calling to support your healthcare needs. Please contact me at [phone] at your earliest convenience. I look forward to speaking with you soon.   Thank you,    Maizie Garno, LCSW [place]  [facility], [facility] Licensed Clinical Social Worker  Direct Dial: [phone]

## 2024-10-22 ENCOUNTER — Other Ambulatory Visit: Payer: Self-pay | Admitting: Family Medicine

## 2024-10-22 DIAGNOSIS — F332 Major depressive disorder, recurrent severe without psychotic features: Secondary | ICD-10-CM

## 2024-10-23 DIAGNOSIS — R079 Chest pain, unspecified: Secondary | ICD-10-CM

## 2024-10-23 DIAGNOSIS — R110 Nausea: Secondary | ICD-10-CM

## 2024-10-23 DIAGNOSIS — R42 Dizziness and giddiness: Secondary | ICD-10-CM | POA: Diagnosis not present

## 2024-10-24 NOTE — Telephone Encounter (Signed)
 Please review medication request change in provider's absence.  LOV- 10/05/2024 NOV- None

## 2024-10-25 ENCOUNTER — Emergency Department

## 2024-10-25 ENCOUNTER — Other Ambulatory Visit: Payer: Self-pay

## 2024-10-25 ENCOUNTER — Emergency Department: Admission: EM | Admit: 2024-10-25 | Discharge: 2024-10-25 | Disposition: A | Discharge status: Home / Self Care

## 2024-10-25 DIAGNOSIS — R569 Unspecified convulsions: Secondary | ICD-10-CM | POA: Diagnosis present

## 2024-10-25 LAB — CBC WITH DIFFERENTIAL/PLATELET
Abs Immature Granulocytes: 0.02 K/uL (ref 0.00–0.07)
Basophils Absolute: 0 K/uL (ref 0.0–0.1)
Basophils Relative: 1 %
Eosinophils Absolute: 0.1 K/uL (ref 0.0–0.5)
Eosinophils Relative: 1 %
HCT: 42.2 % (ref 36.0–46.0)
Hemoglobin: 13.3 g/dL (ref 12.0–15.0)
Immature Granulocytes: 1 %
Lymphocytes Relative: 38 %
Lymphs Abs: 1.6 K/uL (ref 0.7–4.0)
MCH: 27.9 pg (ref 26.0–34.0)
MCHC: 31.5 g/dL (ref 30.0–36.0)
MCV: 88.7 fL (ref 80.0–100.0)
Monocytes Absolute: 0.3 K/uL (ref 0.1–1.0)
Monocytes Relative: 7 %
Neutro Abs: 2.2 K/uL (ref 1.7–7.7)
Neutrophils Relative %: 52 %
Platelets: 228 K/uL (ref 150–400)
RBC: 4.76 MIL/uL (ref 3.87–5.11)
RDW: 13.5 % (ref 11.5–15.5)
WBC: 4.1 K/uL (ref 4.0–10.5)
nRBC: 0 % (ref 0.0–0.2)

## 2024-10-25 LAB — URINE DRUG SCREEN
Amphetamines: NEGATIVE
Barbiturates: NEGATIVE
Benzodiazepines: POSITIVE — AB
Cocaine: NEGATIVE
Fentanyl: NEGATIVE
Methadone Scn, Ur: NEGATIVE
Opiates: NEGATIVE
Tetrahydrocannabinol: NEGATIVE

## 2024-10-25 LAB — COMPREHENSIVE METABOLIC PANEL WITH GFR
ALT: 8 U/L (ref 0–44)
AST: 19 U/L (ref 15–41)
Albumin: 4 g/dL (ref 3.5–5.0)
Alkaline Phosphatase: 87 U/L (ref 38–126)
Anion gap: 11 (ref 5–15)
BUN: 9 mg/dL (ref 6–20)
CO2: 23 mmol/L (ref 22–32)
Calcium: 9 mg/dL (ref 8.9–10.3)
Chloride: 105 mmol/L (ref 98–111)
Creatinine, Ser: 0.81 mg/dL (ref 0.44–1.00)
GFR, Estimated: >60 mL/min (ref >60)
Glucose, Bld: 98 mg/dL (ref 70–99)
Potassium: 4.3 mmol/L (ref 3.5–5.1)
Sodium: 139 mmol/L (ref 135–145)
Total Bilirubin: 0.4 mg/dL (ref 0.0–1.2)
Total Protein: 6.7 g/dL (ref 6.5–8.1)

## 2024-10-25 LAB — URINALYSIS, COMPLETE (UACMP) WITH MICROSCOPIC
Bacteria, UA: NONE SEEN
Bilirubin Urine: NEGATIVE
Glucose, UA: NEGATIVE mg/dL
Hgb urine dipstick: NEGATIVE
Ketones, ur: NEGATIVE mg/dL
Leukocytes,Ua: NEGATIVE
Nitrite: NEGATIVE
Protein, ur: NEGATIVE mg/dL
Specific Gravity, Urine: 1.009 (ref 1.005–1.030)
pH: 6 (ref 5.0–8.0)

## 2024-10-25 LAB — ETHANOL: Alcohol, Ethyl (B): <15 mg/dL (ref <15)

## 2024-10-25 LAB — TROPONIN T, HIGH SENSITIVITY: Troponin T High Sensitivity: <15 ng/L (ref 0–19)

## 2024-10-25 MED ORDER — SODIUM CHLORIDE 0.9 % IV BOLUS
1000.0000 mL | Freq: Once | INTRAVENOUS | Status: AC
  Administered 2024-10-25: 1000 mL via INTRAVENOUS

## 2024-10-25 MED ORDER — LEVETIRACETAM (KEPPRA) 500 MG/5 ML ADULT IV PUSH
1000.0000 mg | Freq: Once | INTRAVENOUS | Status: AC
  Administered 2024-10-25: 1000 mg via INTRAVENOUS
  Filled 2024-10-25: qty 10

## 2024-10-25 MED ORDER — LORAZEPAM 2 MG/ML IJ SOLN
INTRAMUSCULAR | Status: DC
  Filled 2024-10-25: qty 1

## 2024-10-25 NOTE — ED Notes (Signed)
 This RN while rounding on patients, walked in the room to find patient on the floor. Patient was unconscious, patient was unresponsive to speech or sternal rub. Patient had strong bilateral pulses. This RN requested help from [facility]. Dr Clarine was informed and at the bedside.

## 2024-10-25 NOTE — ED Notes (Signed)
 Patient ambulatory with limp, pt was able to ambulate unassisted. Provider notified

## 2024-10-25 NOTE — Consult Note (Addendum)
 Reason for Consult:Seizure like activity Requesting Physician: Ernest  CC: Seizure like activity  I have been asked by Dr. Ernest to see this patient in consultation for seizure like activity.  HPI: Chelsea Stanley is an 58 y.o. female with a history of anxiety, agoraphobia, depression, CVA with resultant right side numbness and difficulty with language on no antiplatelet therapy, seizure (no correlate noted on EEG from 10/10/24) and migraine on Topamax  who presents after being found on the ground having a seizure.  Patient had been walking her service dog.  Had three events in the presence of EMS and another while in the ED.  Now awake and alert.  Able to provide all history but does not recall how many seizures she had today.  Reports that she has seizures about 3-4 times a month.  Has not seen a neurologist in about a year.  Reports her seizures can be associated with tongue biting and urinary incontinence.  Does not appear that the ones she had today were accompanied by either.   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    Past Medical History:  Diagnosis Date   Depression    Seizures (HCC)     Past Surgical History:  Procedure Laterality Date   BREAST SURGERY     INCISION AND DRAINAGE ABSCESS Right 11/26/2021   Procedure: INCISION AND DEBRIDEMENT RIGHT THUMB, WRIST, AND FOREARM;  Surgeon: Marchia Drivers, MD;  Location: [facility] ORS;  Service: Orthopedics;  Laterality: Right;    History reviewed. No pertinent family history.  Social History:  reports that she has never smoked. She has never used smokeless tobacco. She reports current alcohol use. She reports that she does not use drugs.  Allergies  Allergen Reactions   Bee Venom Anaphylaxis   Clindamycin /Lincomycin Anaphylaxis   Diphenhydramine Anxiety, Hives, Palpitations, Itching, Nausea Only, Other (See Comments), Rash and Swelling    Other Reaction(s): Headache   Latex Rash and Anaphylaxis   Peach  [Prunus Persica] Anaphylaxis    Any fuzzy fruit   Peanut-Containing Drug Products Swelling and Anaphylaxis   Penicillins Anaphylaxis    Has patient had a PCN reaction causing immediate rash, facial/tongue/throat swelling, SOB or lightheadedness with hypotension: Yes Has patient had a PCN reaction causing severe rash involving mucus membranes or skin necrosis: Yes Has patient had a PCN reaction that required hospitalization: Yes Has patient had a PCN reaction occurring within the last 10 years: No If all of the above answers are NO, then may proceed with Cephalosporin use.    Strawberry (Diagnostic) Anaphylaxis   Sulfa Antibiotics Rash, Shortness Of Breath and Other (See Comments)    Substance with sulfonamide structure and antibacterial mechanism of action (substance)   Honey    Levofloxacin Rash    Facial rash   Vancomycin Rash   Voltaren [Diclofenac] Rash    Medications: I have reviewed the patient's current medications. Prior to Admission medications   Medication Sig Start Date End Date Taking? Authorizing Provider  Butalbital -APAP-Caffeine  50-300-40 MG CAPS Take 1 capsule by mouth daily as needed. Use Sparingly. 08/22/24   Donzella Lauraine SAILOR, DO  clonazePAM  (KLONOPIN ) 1 MG tablet Take 1 tablet (1 mg total) by mouth 2 (two) times daily. SHORT TERM FILL ONLY. Need to establish with neurology for future prescriptions. 09/28/24   Donzella Lauraine SAILOR, DO  EPINEPHrine  0.3 mg/0.3 mL IJ SOAJ injection Inject 0.3 mg into the muscle as needed for anaphylaxis. 02/11/24   Donzella Lauraine SAILOR, DO  gabapentin  (NEURONTIN ) 300 MG capsule Take 1 capsule (300 mg total) by mouth 3 (three) times daily. 09/28/24   Donzella Lauraine SAILOR, DO  hydrOXYzine  (ATARAX ) 25 MG tablet Take 1 tablet (25 mg total) by mouth daily as needed for anxiety. 09/28/24   Donzella Lauraine SAILOR, DO  Melatonin 5 MG TABS Take 5 mg by mouth at bedtime as needed.    [provider]  polyethylene glycol (MIRALAX  / GLYCOLAX ) packet Take 17 g by  mouth every other day.    [provider]  QUEtiapine  (SEROQUEL ) 50 MG tablet Take 1 tablet (50 mg total) by mouth 2 (two) times daily. 09/28/24   Donzella Lauraine SAILOR, DO  sertraline  (ZOLOFT ) 100 MG tablet TAKE 1 TABLET BY MOUTH EVERY DAY 10/24/24   Franchot Isaiah LABOR, MD  SUMAtriptan  (IMITREX ) 100 MG tablet TAKE 1 TABLET BY MOUTH EVERY 2 HOURS AS NEEDED FOR MIGRAINE. 10/11/24   Pardue, Lauraine SAILOR, DO  topiramate  (TOPAMAX ) 25 MG tablet Week 1: Take 1 tablet daily.  Week 2: Take 2 tablets daily.  Week 3: Take 3 tablets daily.  Week 4: Take 4 tablets daily.  If unable to tolerate higher dose, stay at maximum tolerated dose. 09/28/24   Donzella Lauraine SAILOR, DO  traMADol  (ULTRAM ) 50 MG tablet Take 1 tablet (50 mg total) by mouth at bedtime as needed. 08/22/24   Donzella Lauraine SAILOR, DO  traZODone  (DESYREL ) 150 MG tablet Take 1 tablet (150 mg total) by mouth at bedtime. 10/21/24 01/19/25  Vickey Mettle, MD  Vitamin D , Ergocalciferol , (DRISDOL ) 1.25 MG (50000 UNIT) CAPS capsule Take 1 capsule (50,000 Units total) by mouth every 7 (seven) days. 09/30/24   Donzella Lauraine SAILOR, DO    ROS: History obtained from the patient  General ROS: negative for - chills, fatigue, fever, night sweats, weight gain or weight loss Psychological ROS: poor memory Ophthalmic ROS: negative for - blurry vision, double vision, eye pain or loss of vision ENT ROS: negative for - epistaxis, nasal discharge, oral lesions, sore throat, tinnitus or vertigo Allergy and Immunology ROS: negative for - hives or itchy/watery eyes Hematological and Lymphatic ROS: negative for - bleeding problems, bruising or swollen lymph nodes Endocrine ROS: negative for - galactorrhea, hair pattern changes, polydipsia/polyuria or temperature intolerance Respiratory ROS: negative for - cough, hemoptysis, shortness of breath or wheezing Cardiovascular ROS: chest pain Gastrointestinal ROS: negative for - abdominal pain, diarrhea, hematemesis, nausea/vomiting or stool  incontinence Genito-Urinary ROS: negative for - dysuria, hematuria, incontinence or urinary frequency/urgency Musculoskeletal ROS: back pain Neurological ROS: as noted in HPI, inability to move right toes Dermatological ROS: negative for rash and skin lesion changes   Physical Examination: Blood pressure (!) 108/57, pulse 66, temperature 98.1 F (36.7 C), temperature source Oral, resp. rate 18, weight 67.5 kg, SpO2 100%.  HEENT-  Normocephalic, no lesions, without obvious abnormality.  Normal external eye and conjunctiva.  Normal TM's bilaterally.  Normal auditory canals and external ears. Normal external nose, mucus membranes and septum.  Normal pharynx. Cardiovascular- Single S1, S2 Lungs- CTA Abdomen- soft, non-tender; bowel sounds normal; no masses,  no organomegaly Extremities- no edema Musculoskeletal-Contracture of the right hand Skin-No bruising other than some scratches on the left shin  Neurological Examination   Mental Status: Alert, oriented, thought content appropriate.  Speech fluent with some occasional stuttering.  Able to follow 3 step commands without difficulty. Cranial Nerves: II: Discs flat bilaterally; Visual fields grossly normal, pupils equal, round, reactive to light and accommodation III,IV, VI: ptosis not  present, extra-ocular motions intact bilaterally V,VII: smile symmetric, facial light touch sensation decreased on the right VIII: hearing normal bilaterally XI: bilateral shoulder shrug XII: midline tongue extension Motor: Right : Upper extremity   5/5 (right hand fisted)    Left:     Upper extremity   5/5  Lower extremity   5/5 (no flexion extension at the ankle   Lower extremity   5/5 Tone and bulk:normal tone throughout; no atrophy noted Sensory: Pinprick and light touch decreased in the right upper and lower extremities Deep Tendon Reflexes: Brisk throughout Plantars: Right: mute   Left: mute Cerebellar: normal finger-to-nose and normal  heel-to-shin testing bilaterally Gait: not tested due to safety concerns     Laboratory Studies:   Basic Metabolic Panel: Recent Labs  Lab 10/25/24 1412  NA 139  K 4.3  CL 105  CO2 23  GLUCOSE 98  BUN 9  CREATININE 0.81  CALCIUM 9.0    Liver Function Tests: Recent Labs  Lab 10/25/24 1412  AST 19  ALT 8  ALKPHOS 87  BILITOT 0.4  PROT 6.7  ALBUMIN 4.0   No results for input(s): LIPASE, AMYLASE in the last 168 hours. No results for input(s): AMMONIA in the last 168 hours.  CBC: Recent Labs  Lab 10/25/24 1412  WBC 4.1  NEUTROABS 2.2  HGB 13.3  HCT 42.2  MCV 88.7  PLT 228    Cardiac Enzymes: No results for input(s): CKTOTAL, CKMB, CKMBINDEX, TROPONINI in the last 168 hours.  BNP: Invalid input(s): POCBNP  CBG: No results for input(s): GLUCAP in the last 168 hours.  Microbiology: Results for orders placed or performed during the hospital encounter of 12/27/22  Resp panel by RT-PCR (RSV, Flu A&B, Covid) Anterior Nasal Swab     Status: None   Collection Time: 12/28/22  7:24 PM   Specimen: Anterior Nasal Swab  Result Value Ref Range Status   SARS Coronavirus 2 by RT PCR NEGATIVE NEGATIVE Final    Comment: (NOTE) SARS-CoV-2 target nucleic acids are NOT DETECTED.  The SARS-CoV-2 RNA is generally detectable in upper respiratory specimens during the acute phase of infection. The lowest concentration of SARS-CoV-2 viral copies this assay can detect is 138 copies/mL. A negative result does not preclude SARS-Cov-2 infection and should not be used as the sole basis for treatment or other patient management decisions. A negative result may occur with  improper specimen collection/handling, submission of specimen other than nasopharyngeal swab, presence of viral mutation(s) within the areas targeted by this assay, and inadequate number of viral copies(<138 copies/mL). A negative result must be combined with clinical observations, patient  history, and epidemiological information. The expected result is Negative.  Fact Sheet for Patients:  bloggercourse.com  Fact Sheet for Healthcare Providers:  seriousbroker.it  This test is no t yet approved or cleared by the [place]  FDA and  has been authorized for detection and/or diagnosis of SARS-CoV-2 by FDA under an Emergency Use Authorization (EUA). This EUA will remain  in effect (meaning this test can be used) for the duration of the COVID-19 declaration under Section 564(b)(1) of the Act, 21 U.S.C.section 360bbb-3(b)(1), unless the authorization is terminated  or revoked sooner.       Influenza A by PCR NEGATIVE NEGATIVE Final   Influenza B by PCR NEGATIVE NEGATIVE Final    Comment: (NOTE) The Xpert Xpress SARS-CoV-2/FLU/RSV plus assay is intended as an aid in the diagnosis of influenza from Nasopharyngeal swab specimens and should not be used as  a sole basis for treatment. Nasal washings and aspirates are unacceptable for Xpert Xpress SARS-CoV-2/FLU/RSV testing.  Fact Sheet for Patients: bloggercourse.com  Fact Sheet for Healthcare Providers: seriousbroker.it  This test is not yet approved or cleared by the [place]  FDA and has been authorized for detection and/or diagnosis of SARS-CoV-2 by FDA under an Emergency Use Authorization (EUA). This EUA will remain in effect (meaning this test can be used) for the duration of the COVID-19 declaration under Section 564(b)(1) of the Act, 21 U.S.C. section 360bbb-3(b)(1), unless the authorization is terminated or revoked.     Resp Syncytial Virus by PCR NEGATIVE NEGATIVE Final    Comment: (NOTE) Fact Sheet for Patients: bloggercourse.com  Fact Sheet for Healthcare Providers: seriousbroker.it  This test is not yet approved or cleared by the [place]  FDA  and has been authorized for detection and/or diagnosis of SARS-CoV-2 by FDA under an Emergency Use Authorization (EUA). This EUA will remain in effect (meaning this test can be used) for the duration of the COVID-19 declaration under Section 564(b)(1) of the Act, 21 U.S.C. section 360bbb-3(b)(1), unless the authorization is terminated or revoked.  Performed at [facility], [address] [place], [place] [address]     Coagulation Studies: No results for input(s): LABPROT, INR in the last 72 hours.  Urinalysis:  Recent Labs  Lab 10/25/24 1510  COLORURINE YELLOW*  LABSPEC 1.009  PHURINE 6.0  GLUCOSEU NEGATIVE  HGBUR NEGATIVE  BILIRUBINUR NEGATIVE  KETONESUR NEGATIVE  PROTEINUR NEGATIVE  NITRITE NEGATIVE  LEUKOCYTESUR NEGATIVE    Lipid Panel:     Component Value Date/Time   CHOL 251 (H) 09/28/2024 1039   TRIG 130 09/28/2024 1039   HDL 59 09/28/2024 1039   CHOLHDL 4.3 09/28/2024 1039   LDLCALC 169 (H) 09/28/2024 1039    HgbA1C:  Lab Results  Component Value Date   HGBA1C 5.0 09/28/2024    Urine Drug Screen:      Component Value Date/Time   LABOPIA NEGATIVE 10/25/2024 1510   COCAINSCRNUR NEGATIVE 10/25/2024 1510   COCAINSCRNUR NONE DETECTED 09/20/2024 1547   LABBENZ POSITIVE (A) 10/25/2024 1510   AMPHETMU NEGATIVE 10/25/2024 1510   THCU NEGATIVE 10/25/2024 1510   LABBARB NEGATIVE 10/25/2024 1510    Alcohol Level:  Recent Labs  Lab 10/25/24 1412  ETH <15    Imaging: CT HEAD WITHOUT CONTRAST   TECHNIQUE: Contiguous axial images were obtained from the base of the skull through the vertex without intravenous contrast.   RADIATION DOSE REDUCTION: This exam was performed according to the departmental dose-optimization program which includes automated exposure control, adjustment of the mA and/or kV according to patient size and/or use of iterative reconstruction technique.   COMPARISON:  09/20/2024   FINDINGS: Brain: No acute  infarct or hemorrhage. Lateral ventricles and midline structures are unremarkable. No acute extra-axial fluid collections. No mass effect.   Vascular: No hyperdense vessel or unexpected calcification.   Skull: Normal. Negative for fracture or focal lesion.   Sinuses/Orbits: No acute finding.   Other: None.   IMPRESSION: 1. No acute intracranial process.   Assessment/Plan: 58 y.o. female with a history of anxiety, agoraphobia, depression, CVA with resultant right side numbness and difficulty with language on no antiplatelet therapy, seizure (no correlate noted on EEG from 10/10/24) and migraine on Topamax  who presents after being found on the ground having a seizure.  Patient had been walking her service dog.  Had three events in the presence of EMS and another while in the  ED.  Now awake and alert.  Able to provide all history but does not recall hoe many seizures she had today.  Reports that she has seizures about 3-4 times a month.  Has not seen a neurologist in about a year.  Reports her seizures can be associated with tongue biting and urinary incontinence.  Does not appear that the ones she had today were accompanied by either. Also despite being on the ground no evidence of bruising to suggest a fall from standing.    Concern for continued nonepileptic events.  Head CT personally reviewed and shows no acute changes.  Per chart though it appears that patient is having her Topamax  re-instituted.  Is on escalated dosing and was to start Topamax  100mg  this week.  Is scheduled to see neurology later this month.    Recommendations:  Patient to start Topamax  at 100mg  daily Seizure precautions.  Patient unable to drive, operate heavy machinery, perform activities at heights and participate in water activities until release by outpatient physician.  Patient to keep outpatient appointment If continued events to be seen by psychiatry.   Would limit further benzodiazepine use today if possible.      Case discussed with Dr. Ernest Sonny Hock, MD Neurology  10/25/2024, 4:33 PM

## 2024-10-25 NOTE — Discharge Instructions (Signed)
 Patient to start Topamax  at 100mg  daily.  Call your neurologist to make a follow-up appointment and return to the ER if you develop fevers, worsening symptoms or any other concern  Per [place] statutes, patients with seizures are not allowed to drive until  they have been seizure-free for six months. Use caution when using heavy equipment or power tools. Avoid working on ladders or at heights. Take showers instead of baths. Ensure the water temperature is not too high on the home water heater. Do not go swimming alone. When caring for infants or small children, sit down when holding, feeding, or changing them to minimize risk of injury to the child in the event you have a seizure.   Also, Maintain good sleep hygiene. Avoid alcohol.

## 2024-10-25 NOTE — ED Provider Notes (Signed)
 [facility] Provider Note    Event Date/Time   First MD Initiated Contact with Patient 10/25/24 1356     (approximate)   History   Seizures  Pt arrived via EMS from the public while walking her dog and pt was found to be on the ground having a seizure. Per EMS pt had three while in their presents and one witnessed by the public. Pt also had one seizure while the triage process. Pt had a very short postictal phase.    HPI Chelsea Stanley is a 58 y.o. female PMH nonepileptic seizures, mood disorder, migraines, anxiety presents for evaluation of seizure-like activity - Patient reportedly was witnessed of having a seizure while walking a dog.  Ambulance called.  Patient intermittently awake and interactive and not having full body generalized tonic-clonic like movements.  Glucose in the 90s.  Vital signs stable.  Did receive 10 mg IV Versed  and route to hospital. - On my evaluation, patient is a limited historian.  Initially not interactive though abruptly sits up and starts speaking with me, tells me that she has a history of anxiety and seizures and starts listing the medications that she is taking for this. - Denies any pain   Per chart review, last seen in our emergency department on 09/20/24 after reportedly having seizure-like activity at a grocery store.  Given 10 mg IM Versed .  Spot EEG reportedly unremarkable, neurology and any further seizures or management at that point.  Laboratory workup unremarkable.  CT head neck negative.  Ultimately discharged home after psychiatric evaluation as well.     Physical Exam   Triage Vital Signs: BP (!) 108/57   Pulse 66   Temp 98.1 F (36.7 C) (Oral)   Resp 18   Wt 67.5 kg   SpO2 100%   BMI 29.06 kg/m     Most recent vital signs: Vitals:   10/25/24 1413 10/25/24 1506  BP: (!) 91/59 (!) 108/57  Pulse:  66  Resp:  18  Temp:  98.1 F (36.7 C)  SpO2:  100%     General: Not interactive and then sits  up and has a conversation with me.  Protecting airway without difficulty. HEENT: Normocephalic, atraumatic CV:  Good peripheral perfusion. RRR, RP 2+ Resp:  Normal effort. CTAB Abd:  No distention. Nontender to deep palpation throughout Neuro:  Alert and interactive, unclear orientation, face symmetric, moving all extremity spontaneously   ED Results / Procedures / Treatments   Labs (all labs ordered are listed, but only abnormal results are displayed) Labs Reviewed  URINE DRUG SCREEN - Abnormal; Notable for the following components:      Result Value   Benzodiazepines POSITIVE (*)    All other components within normal limits  URINALYSIS, COMPLETE (UACMP) WITH MICROSCOPIC - Abnormal; Notable for the following components:   Color, Urine YELLOW (*)    APPearance CLEAR (*)    All other components within normal limits  CBC WITH DIFFERENTIAL/PLATELET  COMPREHENSIVE METABOLIC PANEL WITH GFR  ETHANOL  TROPONIN T, HIGH SENSITIVITY     EKG  Ecg = sinus rhythm, rate 58, no gross ST elevation or depression, no significant repolarization abnormality, normal axis, normal intervals.  No evidence of ischemia nor arrhythmia my interpretation.   RADIOLOGY Pending.   PROCEDURES:  Critical Care performed: No  .Critical Care  Performed by: Clarine Ozell LABOR, MD Authorized by: Clarine Ozell LABOR, MD   Critical care provider statement:    Critical care time (minutes):  30   Critical care was necessary to treat or prevent imminent or life-threatening deterioration of the following conditions:  CNS failure or compromise   Critical care was time spent personally by me on the following activities:  Development of treatment plan with patient or surrogate, discussions with consultants, evaluation of patient's response to treatment, examination of patient, ordering and review of laboratory studies, ordering and review of radiographic studies, ordering and performing treatments and interventions, pulse  oximetry, re-evaluation of patient's condition and review of old charts   I assumed direction of critical care for this patient from another provider in my specialty: no   Comments:     I was called to bedside multiple times for seizure-like activity    MEDICATIONS ORDERED IN ED: Medications  levETIRAcetam  (KEPPRA ) undiluted injection 1,000 mg (1,000 mg Intravenous Given 10/25/24 1424)  sodium chloride  0.9 % bolus 1,000 mL (1,000 mLs Intravenous New Bag/Given 10/25/24 1424)     IMPRESSION / MDM / ASSESSMENT AND PLAN / ED COURSE  I reviewed the triage vital signs and the nursing notes.                              DDX/MDM/AP: Differential diagnosis includes, but is not limited to, recurrent nonepileptic seizure, doubt interval development of epileptic seizures, consider underlying electrolyte abnormality, intracranial hemorrhage, infection.  Plan: - Labs - CT head EKG, cardiac monitor - Will load with Keppra  out of abundance of caution  Patient's presentation is most consistent with acute presentation with potential threat to life or bodily function.  The patient is on the cardiac monitor to evaluate for evidence of arrhythmia and/or significant heart rate changes.  ED course below.  Patient did have several seizure-like episodes here though they do not fit typical seizure pattern and she does quickly reoriented afterward.  Discussed with on-call neurologist Dr. Germaine who reviewed the chart and plans to evaluate patient in person, recommends deferring any further benzodiazepines or AEDs at this time, does not recommend treating empirically for status epilepticus, suspect may be nonepileptic seizures.  Signed out to oncoming ED provider pending imaging, neurology evaluation.  Clinical Course as of 10/25/24 1624  Tue Oct 25, 2024  1504 Patient with another episode of seizure-like activity.  Does not fit usual seizure patterns does not return to baseline  Will discuss with  neurology [MM]  1512 D/w Dr. Germaine of neuro no recurrent seizure-like activity episodes here in emergency department -Based on initial conversation, recommends deferring any further benzodiazepines or antiepileptics at this time -Will evaluate patient shortly and provide further recommendations [MM]    Clinical Course User Index [MM] Clarine Ozell LABOR, MD     FINAL CLINICAL IMPRESSION(S) / ED DIAGNOSES   Final diagnoses:  Seizure-like activity (HCC)     Rx / DC Orders   ED Discharge Orders     None        Note:  This document was prepared using Dragon voice recognition software and may include unintentional dictation errors.   Clarine Ozell LABOR, MD 10/25/24 [phone]

## 2024-10-25 NOTE — ED Provider Notes (Addendum)
 6:26 PM Assumed care for off going team.   Blood pressure (!) 108/57, pulse 66, temperature 98.1 F (36.7 C), temperature source Oral, resp. rate 18, weight 67.5 kg, SpO2 100%.  See their HPI for full report but in brief pending CT/neuro eval    CT head and neck were negative.  Chest x-ray was negative  D/w Dr bland did recommend starting 100 mg of Topamax  this week   It seems like someone was getting her back on her Topamax . She is to begin 100mg  a day this week so would place her on that dose. Lets try to limit benzos. She has had a lot and still quite lucid. She should come out of these things on her own.   6:35 PM reevaluated patient she is moving all extremities she denies any SI or HI.  She was instructed that her plan would be for discharge home.    8:50 PM troponin was negative.  Patient continues to deny any SI.  Offered patient to see psychiatry although she did see her last time and patient declined needing to see them today. Patient is ambulatory here and will be discharged home with follow-up outpatient with neurology.  She has had no seizure-like activity since being here at 3 PM.   I was told by outgoing team the patient had a fall just prior to patient signout and was evaluated by Dr. Clarine but there is no updated of it in his note.  CT imaging and head and neck were done after this fall and patient scans were negative.  She on my reassessment denied any new pain and was moving all extremities.  She was able to get up and ambulate so my suspicion for trauma from fall was low.       Ernest Ronal BRAVO, MD 10/25/24 2056    Ernest Ronal BRAVO, MD 10/25/24 2300

## 2024-10-25 NOTE — ED Triage Notes (Signed)
 Pt arrived via EMS from the public while walking her dog and pt was found to be on the ground having a seizure. Per EMS pt had three while in their presents and one witnessed by the public. Pt also had one seizure while the triage process. Pt had a very short postictal phase.

## 2024-10-26 NOTE — Addendum Note (Signed)
 Addended by: DONZELLA DOMINO on: 10/26/2024 05:03 PM   Modules accepted: Level of Service

## 2024-10-29 ENCOUNTER — Other Ambulatory Visit: Payer: Self-pay | Admitting: Family Medicine

## 2024-10-29 DIAGNOSIS — G2581 Restless legs syndrome: Secondary | ICD-10-CM

## 2024-10-29 DIAGNOSIS — F332 Major depressive disorder, recurrent severe without psychotic features: Secondary | ICD-10-CM

## 2024-10-31 NOTE — Progress Notes (Signed)
 Pt reported premenopause so hcg ordered

## 2024-10-31 NOTE — Telephone Encounter (Signed)
 LOV- 10/05/2024 NOV- None L[facility]- 09/28/2024 Outpatient Medication Detail   Disp Refills Start End   QUEtiapine  (SEROQUEL ) 50 MG tablet 60 tablet 0 09/28/2024 --   Sig - Route: Take 1 tablet (50 mg total) by mouth 2 (two) times daily. - Oral   Sent to pharmacy as: QUEtiapine  (SEROQUEL ) 50 MG tablet   E-Prescribing Status: Receipt confirmed by pharmacy (09/28/2024 10:19 AM EST)
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
## 2024-11-06 ENCOUNTER — Other Ambulatory Visit: Payer: Self-pay | Admitting: Family Medicine

## 2024-11-06 DIAGNOSIS — F410 Panic disorder [episodic paroxysmal anxiety] without agoraphobia: Secondary | ICD-10-CM

## 2024-11-15 ENCOUNTER — Ambulatory Visit: Admitting: Psychiatry

## 2024-12-05 ENCOUNTER — Encounter: Payer: Self-pay | Admitting: *Deleted

## 2024-12-05 ENCOUNTER — Telehealth: Payer: Self-pay | Admitting: *Deleted

## 2024-12-05 NOTE — Patient Instructions (Signed)
 Idil Dayton - I have attempted to call you three times but have been unsuccessful in reaching you. I work with Donzella Lauraine SAILOR, DO and am calling to support your healthcare needs. If I can be of assistance to you, please contact me at [phone].     Thank you,     Kobee Medlen, LCSW [place]  [facility] Licensed Clinical Social Worker  Direct Dial: [phone]
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
## 2024-12-27 ENCOUNTER — Ambulatory Visit: Admitting: Psychiatry
